# Patient Record
Sex: Female | Born: 1945 | Race: White | Hispanic: No | Smoking: Never smoker
Health system: Southern US, Community
[De-identification: ages and names within clinical notes are randomized; demographics above are authoritative.]

## PROBLEM LIST (undated history)

## (undated) DIAGNOSIS — C73 Malignant neoplasm of thyroid gland: Secondary | ICD-10-CM

## (undated) DIAGNOSIS — I5030 Unspecified diastolic (congestive) heart failure: Secondary | ICD-10-CM

## (undated) DIAGNOSIS — L80 Vitiligo: Secondary | ICD-10-CM

## (undated) DIAGNOSIS — M1990 Unspecified osteoarthritis, unspecified site: Secondary | ICD-10-CM

## (undated) DIAGNOSIS — E669 Obesity, unspecified: Secondary | ICD-10-CM

## (undated) DIAGNOSIS — I10 Essential (primary) hypertension: Secondary | ICD-10-CM

## (undated) DIAGNOSIS — D869 Sarcoidosis, unspecified: Secondary | ICD-10-CM

## (undated) DIAGNOSIS — N1832 Chronic kidney disease, stage 3b: Secondary | ICD-10-CM

## (undated) DIAGNOSIS — I350 Nonrheumatic aortic (valve) stenosis: Secondary | ICD-10-CM

## (undated) DIAGNOSIS — Z8619 Personal history of other infectious and parasitic diseases: Secondary | ICD-10-CM

## (undated) HISTORY — DX: Malignant neoplasm of thyroid gland: C73

## (undated) HISTORY — DX: Nonrheumatic aortic (valve) stenosis: I35.0

## (undated) HISTORY — DX: Personal history of other infectious and parasitic diseases: Z86.19

## (undated) HISTORY — DX: Vitiligo: L80

## (undated) HISTORY — DX: Hypocalcemia: E83.51

## (undated) HISTORY — DX: Essential (primary) hypertension: I10

## (undated) HISTORY — DX: Unspecified osteoarthritis, unspecified site: M19.90

## (undated) HISTORY — DX: Sarcoidosis, unspecified: D86.9

---

## 1972-05-21 HISTORY — PX: TUMOR EXCISION: SHX421

## 1980-10-19 HISTORY — PX: TUBAL LIGATION: SHX77

## 1984-10-19 HISTORY — PX: ABDOMINAL HYSTERECTOMY: SHX81

## 1992-07-19 HISTORY — PX: CHOLECYSTECTOMY: SHX55

## 1992-07-19 HISTORY — PX: OTHER SURGICAL HISTORY: SHX169

## 2001-09-16 ENCOUNTER — Ambulatory Visit (HOSPITAL_COMMUNITY): Admission: RE | Admit: 2001-09-16 | Discharge: 2001-09-16 | Payer: Self-pay | Admitting: Family Medicine

## 2001-09-16 ENCOUNTER — Encounter: Payer: Self-pay | Admitting: Family Medicine

## 2002-02-13 ENCOUNTER — Encounter: Payer: Self-pay | Admitting: Family Medicine

## 2002-02-13 ENCOUNTER — Ambulatory Visit (HOSPITAL_COMMUNITY): Admission: RE | Admit: 2002-02-13 | Discharge: 2002-02-13 | Payer: Self-pay | Admitting: Family Medicine

## 2002-11-17 ENCOUNTER — Encounter: Payer: Self-pay | Admitting: Family Medicine

## 2002-11-17 ENCOUNTER — Ambulatory Visit (HOSPITAL_COMMUNITY): Admission: RE | Admit: 2002-11-17 | Discharge: 2002-11-17 | Payer: Self-pay | Admitting: Family Medicine

## 2003-11-24 ENCOUNTER — Ambulatory Visit (HOSPITAL_COMMUNITY): Admission: RE | Admit: 2003-11-24 | Discharge: 2003-11-24 | Payer: Self-pay | Admitting: Family Medicine

## 2004-07-19 ENCOUNTER — Inpatient Hospital Stay (HOSPITAL_COMMUNITY): Admission: RE | Admit: 2004-07-19 | Discharge: 2004-07-22 | Payer: Self-pay | Admitting: Orthopedic Surgery

## 2004-08-15 ENCOUNTER — Ambulatory Visit (HOSPITAL_COMMUNITY): Admission: RE | Admit: 2004-08-15 | Discharge: 2004-08-15 | Payer: Self-pay | Admitting: Orthopedic Surgery

## 2005-01-18 ENCOUNTER — Ambulatory Visit (HOSPITAL_COMMUNITY): Admission: RE | Admit: 2005-01-18 | Discharge: 2005-01-18 | Payer: Self-pay | Admitting: *Deleted

## 2007-03-20 ENCOUNTER — Ambulatory Visit (HOSPITAL_COMMUNITY): Admission: RE | Admit: 2007-03-20 | Discharge: 2007-03-20 | Payer: Self-pay | Admitting: *Deleted

## 2010-10-06 NOTE — Op Note (Signed)
Samantha Wood                 ACCOUNT NO.:  [id number]   MEDICAL RECORD NO.:  [id number]          PATIENT TYPE:  INP   LOCATION:  X007                         FACILITY:  [facility]   PHYSICIAN:  Ollen Gross, M.D.    DATE OF BIRTH:  01/12/1946   DATE OF PROCEDURE:  07/19/2004  DATE OF DISCHARGE:                                 OPERATIVE REPORT   PREOPERATIVE DIAGNOSIS:  Osteoarthritis, left knee.   POSTOPERATIVE DIAGNOSIS:  Osteoarthritis, left knee.   OPERATION PERFORMED:  Left total knee arthroplasty, computer navigated.   SURGEON:  Ollen Gross, M.D.   ASSISTANT:  French Ana A. Shuford, P.A.-C.   ANESTHESIA:  Spinal.   ESTIMATED BLOOD LOSS:  Minimal.   DRAINS:  Hemovac times one.   TOURNIQUET TIME:  65 minutes at 300 mmHg.   COMPLICATIONS:  None.  Condition stable to recovery.   INDICATIONS FOR PROCEDURE:  [name] is a 65 year old female with end stage  medial compartment arthritis of the left knee with intractable pain.  She  has failed nonoperative management and presents now for left total knee  arthroplasty.   DESCRIPTION OF PROCEDURE:  After successful administration of spinal  anesthetic, a tourniquet was placed high on the left thigh and left lower  extremity prepped and draped in the usual sterile fashion.  Extremity was  wrapped with an Esmarch, knee flexed and tourniquet inflated to 300 mmHg.  Standard midline incision was made with a 10 blade through subcutaneous  tissue to the level of the extensor mechanism. A fresh blade was used to  make a medial parapatellar arthrotomy.  Then the soft tissue over the  proximal medial tibia subperiosteally elevated to the joint line with a  knife entered into the semimembranosus first with the Cobb elevator.  Soft  tissue over the proximal lateral tibia was also elevated with attention  being paid to avoid the patellar tendon and tibial tubercle.  The patella  was everted and the knee flexed 90 degrees and ACL and PCL  removed.  The  anterior aspect of both menisci were also removed.  Two 4 mm Shantz screws  were then placed into the proximal tibia and two placed into the femur for  placement of the computerized arrays.  Once intact and once the computer  confirmed the placement, we entered all the anatomic data for generation of  the computer driven models of the femur and tibia.  Once completed, our  alignment was found to be approximately 3 degrees of varus and she had about  one degree of hyperextension.  The distal femoral bony cut was then planned.  I removed the anterior aspect of the trochlear flange so that the cutting  block would sit flat on the femur.  We made our cut to take 10 mm off the  distal femur with a neutral varus valgus and neutral flexion extension  alignment.  The block was pinned and the cut made with an oscillating saw.  The computer guide was then placed on the bone cut and confirms that the cut  was made as planned.  The  alignment guide was then placed and we used an  epicondylar axis as our reference.  A size 4 was the most appropriate  femoral component and we subsequently placed the size 4 cutting block and  made the anterior, posterior and chamfer cut.  The verifier was then placed  and confirmed that the anterior cut was made as planned.   The tibia was then subluxed forward and the menisci were removed.  Tibial  cutting block was placed under computer guidance and we then removed  approximately 10 mm off the nondeficient lateral side and made the cut in  neutral varus valgus and about 2 degrees of posterior slope.  The cut was  then made with an oscillating saw and confirmed to be made as planned.  The  size 3 was the most appropriate tibial size and the proximal tibia was  prepared with the modular drill and keel punch for a size 3.  Femoral  preparation was completed with intercondylar cut for a size 4.  Size 3  mobile bearing tibial trial with a size 4 posterior  stabilized femoral trial  and a 10 mm posterior stabilized rotating platform inserter trial were  placed.  With a 10 we achieved full extension with excellent varus and  valgus balance throughout full range of motion.  The computer read that we  had approximately 1 degree flexion contracture when we were neutral varus  valgus.  The patella was then everted again and thickness measured to be 23  mm.  Free hand resection taken to 14 mm, a 35 template is placed, lug holes  are drilled, trial patella was placed and it tracks normally.  Osteophytes  were then removed off the posterior femur with the trials in place.  Full  extension was then confirmed on the computer when we had removed the  osteophytes.  All of the trials were then removed and the cut bone surfaces  were prepared with pulsatile lavage.  Cement was mixed and once ready for  implantation, the size 3 mobile bearing tibial tray, size 4 posterior  stabilized femur and 35 patella were cemented into place and the patella was  held with the clamp.  Trial 10 mm inserts were placed and knee held in full  extension, all extruded cement removed.  Once the cement was fully hardened,  then the permanent 10 mm posterior stabilized rotating platform insert was  placed into the tibial tray.  The knee was then copiously irrigated with  saline solution and the extensor mechanism closed over a Hemovac drain with  interrupted #1 PDS.  Flexion against gravity was about 130 degrees.  Tourniquet was released with a total time of 65 minutes.  Subcutaneous  tissue was closed with interrupted 2-0 Vicryl and subcuticular running 4-0  Monocryl.  Incision was cleaned and dried and Steri-Strips and a bulky  sterile dressing applied.  She was then awakened and transported to recovery  in stable condition.      FA/MEDQ  D:  07/19/2004  T:  07/19/2004  Job:  [id number]

## 2010-10-06 NOTE — Discharge Summary (Signed)
Samantha Wood, Samantha Wood                 ACCOUNT NO.:  [id number]   MEDICAL RECORD NO.:  [id number]          PATIENT TYPE:  INP   LOCATION:  0465                         FACILITY:  [facility]   PHYSICIAN:  Ollen Gross, M.D.    DATE OF BIRTH:  November 19, 1945   DATE OF ADMISSION:  07/19/2004  DATE OF DISCHARGE:  07/22/2004                                 DISCHARGE SUMMARY   ADMISSION DIAGNOSES:  1.  Osteoarthritis left knee.  2.  Hypertension.  3.  Hemorrhoids.  4.  Postmenopausal.   DISCHARGE DIAGNOSES:  1.  Osteoarthritis left knee status post left total knee arthroplasty,      computer navigation assisted.  2.  Mild postoperative blood loss anemia, did not require transfusion.  3.  Hypertension.  4.  Hemorrhoids.  5.  Postmenopausal.   PROCEDURE:  July 19, 2004 - left total knee arthroplasty, computer  navigation assisted. Surgeon:  Dr. Lequita Halt. Assistant:  Ralene Bathe, P.A.-  C. Spinal anesthesia. Minimal blood loss. Hemovac drain x1. Tourniquet time  65 minutes at 300 mmHg.   BRIEF HISTORY:  Ms. Brindisi is a 65 year old female with end-stage medial  compartment arthritis of the left knee who failed nonoperative management  and now presents for a total knee arthroplasty.   CONSULTS:  None.   LABORATORY DATA:  Preoperative CBC:  Hemoglobin 13.8, hematocrit of 40.0,  white cell count 4.9, differential within normal limits. Postoperative  hemoglobin 11.6. Last noted H&H 10.1 and 28.8. PT/PTT preoperatively 13.4  and 31 respectively with INR 1.0. Serial protimes followed. Last noted  PT/INR 18.2 and 1.8. Chem panel on admission:  Glucose elevated at 120,  albumin of 3.4; remaining Chem panel all within normal limits. Serial BMETs  were followed. Glucose went up to 128, back down to 115; calcium dropped  from 9.0 down to 8.1. Electrolytes remained within normal limits. Urinalysis  preoperatively negative. Blood group/type O positive. EKG dated July 14, 2004:  Normal sinus rhythm,  left atrial enlargement, no old tracings to  compare; confirmed by Dr. Daphene Jaeger. Two-view chest on July 14, 2004:  No evidence of active disease.   HOSPITAL COURSE:  Admitted to [facility], underwent above  procedure without complication. Tolerated the procedure well. Started on PCA  and p.o. analgesics for pain control. Hemovac drain placed at the time of  surgery pulled on day #1. She was started on Coumadin per protocol. Started  getting up with therapy out of bed. Day #2 weaned over to p.o. medications  and IV, PCA, and Foley were discontinued. She did well with her therapy and  was ambulating approximately 60 feet and then 80 feet on day #2. She  progressed very well and by day #3 she had already started ambulating 200  feet. She had been weaned over to p.o. medications. Incision was healing  well after the dressing change on day #2, no signs of infection, improved,  and wanted to go home.   DISCHARGE MEDICATIONS/PLAN FOR July 22, 2004:  1.  Discharge home.  2.  Discharge diagnoses:  Please see above.  3.  Discharge medications:  Percocet, Robaxin, Coumadin.  4.  Diet as tolerated.  5.  Activity:  She is weightbearing as tolerated, total knee protocol. 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 May start showering.  6.  Follow up 2 weeks from surgery.   DISPOSITION:  Home.   CONDITION UPON DISCHARGE:  Improved.      ALP/MEDQ  D:  08/21/2004  T:  08/21/2004  Job:  [id number]   cc:   Donzetta Sprung  [address], [address]  [place]  [place] [address]  Fax: [phone]

## 2010-10-06 NOTE — H&P (Signed)
NAME:  Samantha Wood, Samantha Wood                 ACCOUNT NO.:  [id number]   MEDICAL RECORD NO.:  [id number]          PATIENT TYPE:  INP   LOCATION:  NA                           [facility]ACILITY[facility]   PHYSICIAN:  Ollen Gross, M.D.    DATE OF BIRTH:  1945-11-26   DATE OF ADMISSION:  07/19/2004  DATE OF DISCHARGE:                                HISTORY & PHYSICAL   CHIEF COMPLAINT:  Left knee pain.   HISTORY OF PRESENT ILLNESS:  The patient is a 65 year old female with a  longstanding history of problems related to her left knee. She does have  bilateral knee pain but left knee is more symptomatic than the right. Over  the past three years, it has gotten progressively worse to the point now  where it is waking her up at night and causing difficulty in doing certain  activities. She is seen in the office. Her x-rays show that she has bone-on-  bone in the medial compartment with about a 5 degree varus deformity, also  significant patella femoral narrowing. It is felt that due to this symptoms  she is having and the significant findings on x-ray that she would benefit  from undergoing knee replacement. Risks and benefits discussed. The patient  was subsequently admitted to the hospital.   ALLERGIES:  CODEINE causes nausea and vomiting.   MEDICATIONS:  1.  Diltiazem CD 300 mg q.d.  2.  Lisinopril 20/12.5 mg q.d.  3.  Premarin 1.25 mg q.d.  4.  Lorazepam 0.5 mg q.8h. p.r.n.  5.  Ultram 50 mg 1-2 tablets q.4-6h. p.r.n.   PAST MEDICAL HISTORY:  1.  Hypertension.  2.  Hemorrhoids.  3.  Postmenopausal.   PAST SURGICAL HISTORY:  1.  Benign tumor excised from thyroid.  2.  Complete hysterectomy.  3.  Gallbladder surgery.   SOCIAL HISTORY:  Married. She works in the [employer], accounts  payable. Nonsmoker and no alcohol. One child.   FAMILY HISTORY:  Mother with history of heart disease, diabetes,  hypertension, stroke, and arthritis. Father with history of heart disease.   REVIEW OF  SYMPTOMS:  GENERAL:  No fever, chills, or night sweats.  NEUROLOGICAL:  No seizure, syncope, or paralysis. RESPIRATORY:  No shortness  of breath, productive cough, or hemoptysis. CARDIOVASCULAR:  No chest pain,  angina, orthopnea. GI:  No nausea, vomiting, diarrhea, or constipation. GU:  No dysuria, hematuria, or discharge. MUSCULOSKELETAL:  Left knee as found in  the history of present illness.   PHYSICAL EXAMINATION:  VITAL SIGNS:  Pulse 74, respirations 14, blood  pressure 142/78.  GENERAL:  A 65 year old female, well-developed, well-nourished in no acute  distress. She is alert, oriented, and cooperative.  HEENT:  Normocephalic and atraumatic. Pupils are equal, round, and reactive.  The patient is known to wear glasses. EOMs are intact.  NECK:  Supple. No bruits are appreciated.  CHEST:  Clear anterior and posterior chest walls. No rhonchi, rubs, or  wheezing.  HEART:  Regular rate and rhythm.  ABDOMEN:  Soft and nontender. Bowel sounds are present.  RECTAL:  Not done,  not pertinent to present illness.  BREASTS:  Not done, not pertinent to present illness.  GENITALIA:  Not done, not pertinent to present illness.  EXTREMITIES:  Left knee shows she does have a slight varus malalignment of  about 5 degrees. Range of motion of 5 to 115 degrees. Crepitus noted on  passive range of motion.   IMPRESSION:  1.  Osteoarthritis left knee.  2.  Hypertension.  3.  Hemorrhoids.  4.  Postmenopausal.   PLAN:  The patient is admitted to [facility] to undergo a left  total knee arthroplasty. Surgery will be performed by Dr. Ollen Gross.      ALP/MEDQ  D:  07/18/2004  T:  07/18/2004  Job:  [id number]   cc:   Ollen Gross, M.D.  [facility]  [address]  [place]  [place] [address]  Fax: [phone]   Donzetta Sprung  [address], [address]  [place]  [place] [address]  Fax: [phone]

## 2011-02-19 LAB — HM MAMMOGRAPHY

## 2011-12-20 HISTORY — PX: TOTAL THYROIDECTOMY: SHX2547

## 2012-02-04 ENCOUNTER — Encounter: Payer: Self-pay | Admitting: Endocrinology

## 2012-02-04 ENCOUNTER — Ambulatory Visit (INDEPENDENT_AMBULATORY_CARE_PROVIDER_SITE_OTHER): Payer: Medicare Other | Admitting: Endocrinology

## 2012-02-04 ENCOUNTER — Other Ambulatory Visit (INDEPENDENT_AMBULATORY_CARE_PROVIDER_SITE_OTHER): Payer: Medicare Other

## 2012-02-04 VITALS — BP 112/78 | HR 78 | Temp 98.4°F | Ht 64.0 in | Wt 223.0 lb

## 2012-02-04 DIAGNOSIS — E890 Postprocedural hypothyroidism: Secondary | ICD-10-CM

## 2012-02-04 DIAGNOSIS — D869 Sarcoidosis, unspecified: Secondary | ICD-10-CM | POA: Insufficient documentation

## 2012-02-04 NOTE — Patient Instructions (Addendum)
The low calcium almost always goes back to normal by itself.  You can have it rechecked at dr wilson's office in a few days.   blood tests are being requested for you today.  You will receive a letter with results. Stop the levothyroxine pill.   i will probably advise you to recheck the blood test next week.   When your thyroid is low enough, i'll order for you the radioactive iodine pill, to destroy any remaining thyroid cells.     you will notice no symptoms from this.  The pill is gone from your body in a few days (during which you should stay away from other people), but takes several months to work.  X-ray will advise you to go back for a thyroid x-ray approximately 5 days later.  This treatment has been available for many years, and is very safe at this dosage.   Please come back for a follow-up appointment in 6 months.  Come back sooner if you feel you need to.

## 2012-02-04 NOTE — Progress Notes (Signed)
Subjective:    Patient ID: Samantha Wood, female    DOB: 1946/04/24, 66 y.o.   MRN: [id number]  HPI Pt underwent a partial thyroidectomy in the 1970's, for a goiter.  No cancer was found.  She noted a slight recurrent nodule 4 mos ago, but no assoc pain.  After a suspicious bx, she underwent completion thyroidectomy approx 4 weeks ago.  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   Past Medical History  Diagnosis Date  . Hypertension   . Follicular thyroid carcinoma   . Hypocalcemia   . Vitiligo   . Sarcoidosis   . History of chicken pox   . Arthritis     Past Surgical History  Procedure Date  . Tumor excision 05/1972    left side of thyroid  . Tubal ligation 10/1980  . Abdominal hysterectomy 10/1984  . Total thyroidectomy 12/2011  . Gallstone removal from bile duct 07/1992  . Cholecystectomy 07/1992    three weeks after gallstone removal    History   Social History  . Marital Status: Married    Spouse Name: N/A    Number of Children: 1  . Years of Education: 14   Occupational History  . Retired    Social History Main Topics  . Smoking status: Never Smoker   . Smokeless tobacco: Never Used  . Alcohol Use: No  . Drug Use: Not on file  . Sexually Active: Not on file   Other Topics Concern  . Not on file   Social History Narrative   Regular exercise-yesCaffeine Use-no    Current Outpatient Prescriptions on File Prior to Visit  Medication Sig Dispense Refill  . calcium-vitamin D (OSCAL WITH D) 500-200 MG-UNIT per tablet Take 1 tablet by mouth 4 (four) times daily.      . diphenhydrAMINE (BENADRYL) 25 MG tablet Take 25 mg by mouth at bedtime.      Marland Kitchen estradiol (ESTRACE) 1 MG tablet Take 1 mg by mouth daily.       Marland Kitchen levothyroxine (SYNTHROID, LEVOTHROID) 100 MCG tablet Take 100 mcg by mouth daily.       Marland Kitchen lisinopril-hydrochlorothiazide (PRINZIDE,ZESTORETIC) 20-12.5 MG per tablet Take 1 tablet by mouth daily.         Allergies  Allergen  Reactions  . Amoxicillin Nausea And Vomiting  . Codeine     Family History  Problem Relation Age of Onset  . Heart disease Mother   . Stroke Mother   . Hypertension Mother   . Diabetes Mother   . Heart disease Father   . Diabetes Maternal Grandmother   mother had a goiter  BP 112/78  Pulse 78  Temp 98.4 F (36.9 C) (Oral)  Ht 5\' 4"  (1.626 m)  Wt 223 lb (101.152 kg)  BMI 38.28 kg/m2  SpO2 97%  Review of Systems denies depression, hair loss, sob, weight gain, memory loss, constipation, blurry vision, myalgias, dry skin, rhinorrhea, easy bruising, and syncope.  Muscle cramps are much better.  Numbness is getting better.  She has easy bruising.    Objective:   Physical Exam VS: see vs page GEN: no distress HEAD: head: no deformity eyes: no periorbital swelling, no proptosis external nose and ears are normal mouth: no lesion seen NECK: a healed scar is present.  i do not appreciate a nodule in the thyroid or elsewhere in the neck CHEST [place]: no deformity LUNGS:  Clear to auscultation CV: reg rate and rhythm, no murmur ABD: abdomen is  soft, nontender.  no hepatosplenomegaly.  not distended.  no hernia MUSCULOSKELETAL: muscle bulk and strength are grossly normal.  no obvious joint swelling.  gait is normal and steady EXTEMITIES: no deformity.  no ulcer on the feet.  feet are of normal color and temp.  no edema PULSES: dorsalis pedis intact bilat.   NEURO:  cn 2-12 grossly intact.   readily moves all 4's.  sensation is intact to touch on the feet SKIN:  Normal texture and temperature.  Vitiligo is noted NODES:  None palpable at the neck PSYCH: alert, oriented x3.  Does not appear anxious nor depressed. 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  She needs adjuvant I-131 rx Postsurgical hypothyroidism.  She'll need to be off synthroid for I-131 Postsurgical hypocalcemia,  which is almost always transient.

## 2012-02-06 ENCOUNTER — Encounter: Payer: Self-pay | Admitting: Endocrinology

## 2012-02-06 ENCOUNTER — Telehealth: Payer: Self-pay | Admitting: Endocrinology

## 2012-02-06 DIAGNOSIS — C73 Malignant neoplasm of thyroid gland: Secondary | ICD-10-CM | POA: Insufficient documentation

## 2012-02-06 DIAGNOSIS — M1990 Unspecified osteoarthritis, unspecified site: Secondary | ICD-10-CM | POA: Insufficient documentation

## 2012-02-06 DIAGNOSIS — I10 Essential (primary) hypertension: Secondary | ICD-10-CM | POA: Insufficient documentation

## 2012-02-06 NOTE — Telephone Encounter (Signed)
please call patient: To clarify, pt should not take the thyroid pill for now Please redo the blood test next week

## 2012-02-07 NOTE — Telephone Encounter (Signed)
Called pt LMOVM to call back 

## 2012-02-11 NOTE — Telephone Encounter (Signed)
You may be right.  Please do the blood test today, to see.

## 2012-02-11 NOTE — Telephone Encounter (Signed)
Left message on machine for pt to return my call  

## 2012-02-11 NOTE — Telephone Encounter (Signed)
Pt called c/o swelling in the face and ankles that's he believes is a side effect of thyroid treatment. Pt is requesting advisement from MD

## 2012-02-11 NOTE — Telephone Encounter (Signed)
Pt advised.

## 2012-02-12 ENCOUNTER — Ambulatory Visit (INDEPENDENT_AMBULATORY_CARE_PROVIDER_SITE_OTHER): Payer: Medicare Other | Admitting: Endocrinology

## 2012-02-12 DIAGNOSIS — E890 Postprocedural hypothyroidism: Secondary | ICD-10-CM

## 2012-02-18 ENCOUNTER — Other Ambulatory Visit (INDEPENDENT_AMBULATORY_CARE_PROVIDER_SITE_OTHER): Payer: Medicare Other

## 2012-02-18 ENCOUNTER — Other Ambulatory Visit: Payer: Self-pay | Admitting: Endocrinology

## 2012-02-18 DIAGNOSIS — E890 Postprocedural hypothyroidism: Secondary | ICD-10-CM

## 2012-02-18 DIAGNOSIS — C73 Malignant neoplasm of thyroid gland: Secondary | ICD-10-CM

## 2012-02-22 ENCOUNTER — Other Ambulatory Visit: Payer: Self-pay | Admitting: Endocrinology

## 2012-02-22 DIAGNOSIS — C73 Malignant neoplasm of thyroid gland: Secondary | ICD-10-CM

## 2012-02-25 ENCOUNTER — Encounter (HOSPITAL_COMMUNITY)
Admission: RE | Admit: 2012-02-25 | Discharge: 2012-02-25 | Disposition: A | Discharge status: Home / Self Care | Payer: Medicare Other | Source: Ambulatory Visit | Attending: Endocrinology | Admitting: Endocrinology

## 2012-02-25 ENCOUNTER — Encounter (HOSPITAL_COMMUNITY): Payer: Self-pay

## 2012-02-25 DIAGNOSIS — C73 Malignant neoplasm of thyroid gland: Secondary | ICD-10-CM

## 2012-02-25 MED ORDER — SODIUM IODIDE I 131 CAPSULE
53.3000 | Freq: Once | INTRAVENOUS | Status: AC | PRN
  Administered 2012-02-25: 53.3 via ORAL

## 2012-03-06 ENCOUNTER — Encounter (HOSPITAL_COMMUNITY)
Admission: RE | Admit: 2012-03-06 | Discharge: 2012-03-06 | Disposition: A | Discharge status: Home / Self Care | Payer: Medicare Other | Source: Ambulatory Visit | Attending: Endocrinology | Admitting: Endocrinology

## 2012-03-06 DIAGNOSIS — C73 Malignant neoplasm of thyroid gland: Secondary | ICD-10-CM | POA: Insufficient documentation

## 2012-03-06 MED ORDER — SODIUM IODIDE I 131 CAPSULE: 53.3000 | Freq: Once | INTRAVENOUS | Status: DC | PRN

## 2012-06-23 ENCOUNTER — Ambulatory Visit: Payer: Medicare Other | Admitting: Endocrinology

## 2012-07-05 ENCOUNTER — Other Ambulatory Visit: Payer: Self-pay

## 2012-12-24 ENCOUNTER — Other Ambulatory Visit: Payer: Self-pay

## 2013-03-26 ENCOUNTER — Other Ambulatory Visit: Payer: Self-pay

## 2014-07-01 DIAGNOSIS — E039 Hypothyroidism, unspecified: Secondary | ICD-10-CM | POA: Diagnosis not present

## 2014-07-01 DIAGNOSIS — N183 Chronic kidney disease, stage 3 (moderate): Secondary | ICD-10-CM | POA: Diagnosis not present

## 2014-07-01 DIAGNOSIS — I10 Essential (primary) hypertension: Secondary | ICD-10-CM | POA: Diagnosis not present

## 2014-07-01 DIAGNOSIS — E782 Mixed hyperlipidemia: Secondary | ICD-10-CM | POA: Diagnosis not present

## 2014-07-08 DIAGNOSIS — E039 Hypothyroidism, unspecified: Secondary | ICD-10-CM | POA: Diagnosis not present

## 2014-07-08 DIAGNOSIS — E782 Mixed hyperlipidemia: Secondary | ICD-10-CM | POA: Diagnosis not present

## 2014-07-08 DIAGNOSIS — Z1389 Encounter for screening for other disorder: Secondary | ICD-10-CM | POA: Diagnosis not present

## 2014-07-08 DIAGNOSIS — E200 Idiopathic hypoparathyroidism: Secondary | ICD-10-CM | POA: Diagnosis not present

## 2014-07-08 DIAGNOSIS — E6609 Other obesity due to excess calories: Secondary | ICD-10-CM | POA: Diagnosis not present

## 2014-07-13 DIAGNOSIS — Z1231 Encounter for screening mammogram for malignant neoplasm of breast: Secondary | ICD-10-CM | POA: Diagnosis not present

## 2014-11-15 ENCOUNTER — Other Ambulatory Visit: Payer: Self-pay

## 2014-11-24 DIAGNOSIS — I10 Essential (primary) hypertension: Secondary | ICD-10-CM | POA: Diagnosis not present

## 2014-11-24 DIAGNOSIS — K219 Gastro-esophageal reflux disease without esophagitis: Secondary | ICD-10-CM | POA: Diagnosis not present

## 2014-11-24 DIAGNOSIS — E039 Hypothyroidism, unspecified: Secondary | ICD-10-CM | POA: Diagnosis not present

## 2014-11-24 DIAGNOSIS — E782 Mixed hyperlipidemia: Secondary | ICD-10-CM | POA: Diagnosis not present

## 2014-12-01 DIAGNOSIS — E039 Hypothyroidism, unspecified: Secondary | ICD-10-CM | POA: Diagnosis not present

## 2014-12-01 DIAGNOSIS — E6609 Other obesity due to excess calories: Secondary | ICD-10-CM | POA: Diagnosis not present

## 2014-12-01 DIAGNOSIS — E782 Mixed hyperlipidemia: Secondary | ICD-10-CM | POA: Diagnosis not present

## 2014-12-01 DIAGNOSIS — E200 Idiopathic hypoparathyroidism: Secondary | ICD-10-CM | POA: Diagnosis not present

## 2015-08-05 DIAGNOSIS — E782 Mixed hyperlipidemia: Secondary | ICD-10-CM | POA: Diagnosis not present

## 2015-08-05 DIAGNOSIS — I10 Essential (primary) hypertension: Secondary | ICD-10-CM | POA: Diagnosis not present

## 2015-08-05 DIAGNOSIS — N183 Chronic kidney disease, stage 3 (moderate): Secondary | ICD-10-CM | POA: Diagnosis not present

## 2015-08-05 DIAGNOSIS — E039 Hypothyroidism, unspecified: Secondary | ICD-10-CM | POA: Diagnosis not present

## 2015-08-09 DIAGNOSIS — E782 Mixed hyperlipidemia: Secondary | ICD-10-CM | POA: Diagnosis not present

## 2015-08-16 DIAGNOSIS — Z1212 Encounter for screening for malignant neoplasm of rectum: Secondary | ICD-10-CM | POA: Diagnosis not present

## 2015-08-16 DIAGNOSIS — Z0001 Encounter for general adult medical examination with abnormal findings: Secondary | ICD-10-CM | POA: Diagnosis not present

## 2015-08-16 DIAGNOSIS — E782 Mixed hyperlipidemia: Secondary | ICD-10-CM | POA: Diagnosis not present

## 2015-08-16 DIAGNOSIS — E039 Hypothyroidism, unspecified: Secondary | ICD-10-CM | POA: Diagnosis not present

## 2015-08-16 DIAGNOSIS — E200 Idiopathic hypoparathyroidism: Secondary | ICD-10-CM | POA: Diagnosis not present

## 2015-08-16 DIAGNOSIS — Z1389 Encounter for screening for other disorder: Secondary | ICD-10-CM | POA: Diagnosis not present

## 2015-08-16 DIAGNOSIS — E6609 Other obesity due to excess calories: Secondary | ICD-10-CM | POA: Diagnosis not present

## 2015-08-19 DIAGNOSIS — R011 Cardiac murmur, unspecified: Secondary | ICD-10-CM | POA: Diagnosis not present

## 2015-08-19 DIAGNOSIS — R931 Abnormal findings on diagnostic imaging of heart and coronary circulation: Secondary | ICD-10-CM | POA: Diagnosis not present

## 2015-08-19 DIAGNOSIS — I083 Combined rheumatic disorders of mitral, aortic and tricuspid valves: Secondary | ICD-10-CM | POA: Diagnosis not present

## 2015-08-19 DIAGNOSIS — I517 Cardiomegaly: Secondary | ICD-10-CM | POA: Diagnosis not present

## 2016-01-25 DIAGNOSIS — M545 Low back pain: Secondary | ICD-10-CM | POA: Diagnosis not present

## 2016-01-25 DIAGNOSIS — M7062 Trochanteric bursitis, left hip: Secondary | ICD-10-CM | POA: Diagnosis not present

## 2016-02-14 DIAGNOSIS — I10 Essential (primary) hypertension: Secondary | ICD-10-CM | POA: Diagnosis not present

## 2016-02-14 DIAGNOSIS — K219 Gastro-esophageal reflux disease without esophagitis: Secondary | ICD-10-CM | POA: Diagnosis not present

## 2016-02-14 DIAGNOSIS — N183 Chronic kidney disease, stage 3 (moderate): Secondary | ICD-10-CM | POA: Diagnosis not present

## 2016-02-14 DIAGNOSIS — E782 Mixed hyperlipidemia: Secondary | ICD-10-CM | POA: Diagnosis not present

## 2016-02-14 DIAGNOSIS — E039 Hypothyroidism, unspecified: Secondary | ICD-10-CM | POA: Diagnosis not present

## 2016-02-17 DIAGNOSIS — E039 Hypothyroidism, unspecified: Secondary | ICD-10-CM | POA: Diagnosis not present

## 2016-02-17 DIAGNOSIS — E200 Idiopathic hypoparathyroidism: Secondary | ICD-10-CM | POA: Diagnosis not present

## 2016-02-17 DIAGNOSIS — E782 Mixed hyperlipidemia: Secondary | ICD-10-CM | POA: Diagnosis not present

## 2016-02-17 DIAGNOSIS — I10 Essential (primary) hypertension: Secondary | ICD-10-CM | POA: Diagnosis not present

## 2016-09-03 DIAGNOSIS — K219 Gastro-esophageal reflux disease without esophagitis: Secondary | ICD-10-CM | POA: Diagnosis not present

## 2016-09-03 DIAGNOSIS — N183 Chronic kidney disease, stage 3 (moderate): Secondary | ICD-10-CM | POA: Diagnosis not present

## 2016-09-03 DIAGNOSIS — E039 Hypothyroidism, unspecified: Secondary | ICD-10-CM | POA: Diagnosis not present

## 2016-09-03 DIAGNOSIS — E782 Mixed hyperlipidemia: Secondary | ICD-10-CM | POA: Diagnosis not present

## 2016-09-03 DIAGNOSIS — I10 Essential (primary) hypertension: Secondary | ICD-10-CM | POA: Diagnosis not present

## 2016-09-05 DIAGNOSIS — E200 Idiopathic hypoparathyroidism: Secondary | ICD-10-CM | POA: Diagnosis not present

## 2016-09-05 DIAGNOSIS — E039 Hypothyroidism, unspecified: Secondary | ICD-10-CM | POA: Diagnosis not present

## 2016-09-05 DIAGNOSIS — I10 Essential (primary) hypertension: Secondary | ICD-10-CM | POA: Diagnosis not present

## 2016-09-05 DIAGNOSIS — E782 Mixed hyperlipidemia: Secondary | ICD-10-CM | POA: Diagnosis not present

## 2017-01-03 DIAGNOSIS — N183 Chronic kidney disease, stage 3 (moderate): Secondary | ICD-10-CM | POA: Diagnosis not present

## 2017-01-03 DIAGNOSIS — I10 Essential (primary) hypertension: Secondary | ICD-10-CM | POA: Diagnosis not present

## 2017-01-03 DIAGNOSIS — E782 Mixed hyperlipidemia: Secondary | ICD-10-CM | POA: Diagnosis not present

## 2017-01-03 DIAGNOSIS — E039 Hypothyroidism, unspecified: Secondary | ICD-10-CM | POA: Diagnosis not present

## 2017-01-03 DIAGNOSIS — K219 Gastro-esophageal reflux disease without esophagitis: Secondary | ICD-10-CM | POA: Diagnosis not present

## 2017-01-03 DIAGNOSIS — Z9189 Other specified personal risk factors, not elsewhere classified: Secondary | ICD-10-CM | POA: Diagnosis not present

## 2017-01-08 DIAGNOSIS — N183 Chronic kidney disease, stage 3 (moderate): Secondary | ICD-10-CM | POA: Diagnosis not present

## 2017-01-08 DIAGNOSIS — I10 Essential (primary) hypertension: Secondary | ICD-10-CM | POA: Diagnosis not present

## 2017-01-08 DIAGNOSIS — Z0001 Encounter for general adult medical examination with abnormal findings: Secondary | ICD-10-CM | POA: Diagnosis not present

## 2017-01-08 DIAGNOSIS — Z1212 Encounter for screening for malignant neoplasm of rectum: Secondary | ICD-10-CM | POA: Diagnosis not present

## 2017-01-08 DIAGNOSIS — I2510 Atherosclerotic heart disease of native coronary artery without angina pectoris: Secondary | ICD-10-CM | POA: Diagnosis not present

## 2017-01-08 DIAGNOSIS — R011 Cardiac murmur, unspecified: Secondary | ICD-10-CM | POA: Diagnosis not present

## 2017-02-20 DIAGNOSIS — E039 Hypothyroidism, unspecified: Secondary | ICD-10-CM | POA: Diagnosis not present

## 2017-03-04 DIAGNOSIS — I10 Essential (primary) hypertension: Secondary | ICD-10-CM | POA: Diagnosis not present

## 2017-03-04 DIAGNOSIS — M7712 Lateral epicondylitis, left elbow: Secondary | ICD-10-CM | POA: Diagnosis not present

## 2017-03-04 DIAGNOSIS — L570 Actinic keratosis: Secondary | ICD-10-CM | POA: Diagnosis not present

## 2017-03-04 DIAGNOSIS — M7711 Lateral epicondylitis, right elbow: Secondary | ICD-10-CM | POA: Diagnosis not present

## 2017-06-17 DIAGNOSIS — R3915 Urgency of urination: Secondary | ICD-10-CM | POA: Diagnosis not present

## 2017-07-03 DIAGNOSIS — E782 Mixed hyperlipidemia: Secondary | ICD-10-CM | POA: Diagnosis not present

## 2017-07-03 DIAGNOSIS — I10 Essential (primary) hypertension: Secondary | ICD-10-CM | POA: Diagnosis not present

## 2017-07-03 DIAGNOSIS — Z9189 Other specified personal risk factors, not elsewhere classified: Secondary | ICD-10-CM | POA: Diagnosis not present

## 2017-07-03 DIAGNOSIS — K219 Gastro-esophageal reflux disease without esophagitis: Secondary | ICD-10-CM | POA: Diagnosis not present

## 2017-07-03 DIAGNOSIS — I350 Nonrheumatic aortic (valve) stenosis: Secondary | ICD-10-CM | POA: Diagnosis not present

## 2017-07-09 DIAGNOSIS — L570 Actinic keratosis: Secondary | ICD-10-CM | POA: Diagnosis not present

## 2017-07-09 DIAGNOSIS — E200 Idiopathic hypoparathyroidism: Secondary | ICD-10-CM | POA: Diagnosis not present

## 2017-07-09 DIAGNOSIS — I10 Essential (primary) hypertension: Secondary | ICD-10-CM | POA: Diagnosis not present

## 2017-07-09 DIAGNOSIS — E782 Mixed hyperlipidemia: Secondary | ICD-10-CM | POA: Diagnosis not present

## 2017-07-09 DIAGNOSIS — E039 Hypothyroidism, unspecified: Secondary | ICD-10-CM | POA: Diagnosis not present

## 2017-09-03 DIAGNOSIS — D044 Carcinoma in situ of skin of scalp and neck: Secondary | ICD-10-CM | POA: Diagnosis not present

## 2017-09-03 DIAGNOSIS — C4441 Basal cell carcinoma of skin of scalp and neck: Secondary | ICD-10-CM | POA: Diagnosis not present

## 2017-12-27 DIAGNOSIS — M545 Low back pain: Secondary | ICD-10-CM | POA: Diagnosis not present

## 2017-12-27 DIAGNOSIS — I10 Essential (primary) hypertension: Secondary | ICD-10-CM | POA: Diagnosis not present

## 2017-12-30 DIAGNOSIS — S233XXA Sprain of ligaments of thoracic spine, initial encounter: Secondary | ICD-10-CM | POA: Diagnosis not present

## 2017-12-30 DIAGNOSIS — S134XXA Sprain of ligaments of cervical spine, initial encounter: Secondary | ICD-10-CM | POA: Diagnosis not present

## 2017-12-30 DIAGNOSIS — M9901 Segmental and somatic dysfunction of cervical region: Secondary | ICD-10-CM | POA: Diagnosis not present

## 2017-12-30 DIAGNOSIS — M9902 Segmental and somatic dysfunction of thoracic region: Secondary | ICD-10-CM | POA: Diagnosis not present

## 2017-12-30 DIAGNOSIS — M9903 Segmental and somatic dysfunction of lumbar region: Secondary | ICD-10-CM | POA: Diagnosis not present

## 2017-12-31 DIAGNOSIS — M9902 Segmental and somatic dysfunction of thoracic region: Secondary | ICD-10-CM | POA: Diagnosis not present

## 2017-12-31 DIAGNOSIS — M9903 Segmental and somatic dysfunction of lumbar region: Secondary | ICD-10-CM | POA: Diagnosis not present

## 2017-12-31 DIAGNOSIS — S134XXA Sprain of ligaments of cervical spine, initial encounter: Secondary | ICD-10-CM | POA: Diagnosis not present

## 2017-12-31 DIAGNOSIS — M9901 Segmental and somatic dysfunction of cervical region: Secondary | ICD-10-CM | POA: Diagnosis not present

## 2017-12-31 DIAGNOSIS — S233XXA Sprain of ligaments of thoracic spine, initial encounter: Secondary | ICD-10-CM | POA: Diagnosis not present

## 2018-01-03 DIAGNOSIS — M9901 Segmental and somatic dysfunction of cervical region: Secondary | ICD-10-CM | POA: Diagnosis not present

## 2018-01-03 DIAGNOSIS — S134XXA Sprain of ligaments of cervical spine, initial encounter: Secondary | ICD-10-CM | POA: Diagnosis not present

## 2018-01-03 DIAGNOSIS — M9902 Segmental and somatic dysfunction of thoracic region: Secondary | ICD-10-CM | POA: Diagnosis not present

## 2018-01-03 DIAGNOSIS — S233XXA Sprain of ligaments of thoracic spine, initial encounter: Secondary | ICD-10-CM | POA: Diagnosis not present

## 2018-01-03 DIAGNOSIS — M9903 Segmental and somatic dysfunction of lumbar region: Secondary | ICD-10-CM | POA: Diagnosis not present

## 2018-01-07 DIAGNOSIS — M9903 Segmental and somatic dysfunction of lumbar region: Secondary | ICD-10-CM | POA: Diagnosis not present

## 2018-01-07 DIAGNOSIS — M9901 Segmental and somatic dysfunction of cervical region: Secondary | ICD-10-CM | POA: Diagnosis not present

## 2018-01-07 DIAGNOSIS — S134XXA Sprain of ligaments of cervical spine, initial encounter: Secondary | ICD-10-CM | POA: Diagnosis not present

## 2018-01-07 DIAGNOSIS — M9902 Segmental and somatic dysfunction of thoracic region: Secondary | ICD-10-CM | POA: Diagnosis not present

## 2018-01-07 DIAGNOSIS — S233XXA Sprain of ligaments of thoracic spine, initial encounter: Secondary | ICD-10-CM | POA: Diagnosis not present

## 2018-01-09 DIAGNOSIS — I350 Nonrheumatic aortic (valve) stenosis: Secondary | ICD-10-CM | POA: Diagnosis not present

## 2018-01-09 DIAGNOSIS — M9901 Segmental and somatic dysfunction of cervical region: Secondary | ICD-10-CM | POA: Diagnosis not present

## 2018-01-09 DIAGNOSIS — E782 Mixed hyperlipidemia: Secondary | ICD-10-CM | POA: Diagnosis not present

## 2018-01-09 DIAGNOSIS — K219 Gastro-esophageal reflux disease without esophagitis: Secondary | ICD-10-CM | POA: Diagnosis not present

## 2018-01-09 DIAGNOSIS — E200 Idiopathic hypoparathyroidism: Secondary | ICD-10-CM | POA: Diagnosis not present

## 2018-01-09 DIAGNOSIS — M9902 Segmental and somatic dysfunction of thoracic region: Secondary | ICD-10-CM | POA: Diagnosis not present

## 2018-01-09 DIAGNOSIS — M9903 Segmental and somatic dysfunction of lumbar region: Secondary | ICD-10-CM | POA: Diagnosis not present

## 2018-01-09 DIAGNOSIS — S233XXA Sprain of ligaments of thoracic spine, initial encounter: Secondary | ICD-10-CM | POA: Diagnosis not present

## 2018-01-09 DIAGNOSIS — S134XXA Sprain of ligaments of cervical spine, initial encounter: Secondary | ICD-10-CM | POA: Diagnosis not present

## 2018-01-09 DIAGNOSIS — Z9189 Other specified personal risk factors, not elsewhere classified: Secondary | ICD-10-CM | POA: Diagnosis not present

## 2018-01-14 DIAGNOSIS — M9901 Segmental and somatic dysfunction of cervical region: Secondary | ICD-10-CM | POA: Diagnosis not present

## 2018-01-14 DIAGNOSIS — M9903 Segmental and somatic dysfunction of lumbar region: Secondary | ICD-10-CM | POA: Diagnosis not present

## 2018-01-14 DIAGNOSIS — S233XXA Sprain of ligaments of thoracic spine, initial encounter: Secondary | ICD-10-CM | POA: Diagnosis not present

## 2018-01-14 DIAGNOSIS — M9902 Segmental and somatic dysfunction of thoracic region: Secondary | ICD-10-CM | POA: Diagnosis not present

## 2018-01-14 DIAGNOSIS — S134XXA Sprain of ligaments of cervical spine, initial encounter: Secondary | ICD-10-CM | POA: Diagnosis not present

## 2018-01-15 DIAGNOSIS — Z1212 Encounter for screening for malignant neoplasm of rectum: Secondary | ICD-10-CM | POA: Diagnosis not present

## 2018-01-15 DIAGNOSIS — I10 Essential (primary) hypertension: Secondary | ICD-10-CM | POA: Diagnosis not present

## 2018-01-15 DIAGNOSIS — Z0001 Encounter for general adult medical examination with abnormal findings: Secondary | ICD-10-CM | POA: Diagnosis not present

## 2018-01-21 DIAGNOSIS — S134XXA Sprain of ligaments of cervical spine, initial encounter: Secondary | ICD-10-CM | POA: Diagnosis not present

## 2018-01-21 DIAGNOSIS — M9901 Segmental and somatic dysfunction of cervical region: Secondary | ICD-10-CM | POA: Diagnosis not present

## 2018-01-21 DIAGNOSIS — M9903 Segmental and somatic dysfunction of lumbar region: Secondary | ICD-10-CM | POA: Diagnosis not present

## 2018-01-21 DIAGNOSIS — M9902 Segmental and somatic dysfunction of thoracic region: Secondary | ICD-10-CM | POA: Diagnosis not present

## 2018-01-21 DIAGNOSIS — S233XXA Sprain of ligaments of thoracic spine, initial encounter: Secondary | ICD-10-CM | POA: Diagnosis not present

## 2018-01-28 DIAGNOSIS — S233XXA Sprain of ligaments of thoracic spine, initial encounter: Secondary | ICD-10-CM | POA: Diagnosis not present

## 2018-01-28 DIAGNOSIS — S134XXA Sprain of ligaments of cervical spine, initial encounter: Secondary | ICD-10-CM | POA: Diagnosis not present

## 2018-01-28 DIAGNOSIS — M9901 Segmental and somatic dysfunction of cervical region: Secondary | ICD-10-CM | POA: Diagnosis not present

## 2018-01-28 DIAGNOSIS — M9902 Segmental and somatic dysfunction of thoracic region: Secondary | ICD-10-CM | POA: Diagnosis not present

## 2018-01-28 DIAGNOSIS — M9903 Segmental and somatic dysfunction of lumbar region: Secondary | ICD-10-CM | POA: Diagnosis not present

## 2018-02-10 DIAGNOSIS — N3000 Acute cystitis without hematuria: Secondary | ICD-10-CM | POA: Diagnosis not present

## 2018-02-11 DIAGNOSIS — M9903 Segmental and somatic dysfunction of lumbar region: Secondary | ICD-10-CM | POA: Diagnosis not present

## 2018-02-11 DIAGNOSIS — M9901 Segmental and somatic dysfunction of cervical region: Secondary | ICD-10-CM | POA: Diagnosis not present

## 2018-02-11 DIAGNOSIS — S233XXA Sprain of ligaments of thoracic spine, initial encounter: Secondary | ICD-10-CM | POA: Diagnosis not present

## 2018-02-11 DIAGNOSIS — S134XXA Sprain of ligaments of cervical spine, initial encounter: Secondary | ICD-10-CM | POA: Diagnosis not present

## 2018-02-11 DIAGNOSIS — M9902 Segmental and somatic dysfunction of thoracic region: Secondary | ICD-10-CM | POA: Diagnosis not present

## 2018-02-22 DIAGNOSIS — N3090 Cystitis, unspecified without hematuria: Secondary | ICD-10-CM | POA: Diagnosis not present

## 2018-03-04 DIAGNOSIS — S134XXA Sprain of ligaments of cervical spine, initial encounter: Secondary | ICD-10-CM | POA: Diagnosis not present

## 2018-03-04 DIAGNOSIS — M9901 Segmental and somatic dysfunction of cervical region: Secondary | ICD-10-CM | POA: Diagnosis not present

## 2018-03-04 DIAGNOSIS — M9902 Segmental and somatic dysfunction of thoracic region: Secondary | ICD-10-CM | POA: Diagnosis not present

## 2018-03-04 DIAGNOSIS — M9903 Segmental and somatic dysfunction of lumbar region: Secondary | ICD-10-CM | POA: Diagnosis not present

## 2018-03-04 DIAGNOSIS — S233XXA Sprain of ligaments of thoracic spine, initial encounter: Secondary | ICD-10-CM | POA: Diagnosis not present

## 2018-03-25 DIAGNOSIS — S134XXA Sprain of ligaments of cervical spine, initial encounter: Secondary | ICD-10-CM | POA: Diagnosis not present

## 2018-03-25 DIAGNOSIS — M9902 Segmental and somatic dysfunction of thoracic region: Secondary | ICD-10-CM | POA: Diagnosis not present

## 2018-03-25 DIAGNOSIS — S233XXA Sprain of ligaments of thoracic spine, initial encounter: Secondary | ICD-10-CM | POA: Diagnosis not present

## 2018-03-25 DIAGNOSIS — M9901 Segmental and somatic dysfunction of cervical region: Secondary | ICD-10-CM | POA: Diagnosis not present

## 2018-03-25 DIAGNOSIS — M9903 Segmental and somatic dysfunction of lumbar region: Secondary | ICD-10-CM | POA: Diagnosis not present

## 2018-04-19 DIAGNOSIS — E039 Hypothyroidism, unspecified: Secondary | ICD-10-CM | POA: Diagnosis not present

## 2018-04-19 DIAGNOSIS — I10 Essential (primary) hypertension: Secondary | ICD-10-CM | POA: Diagnosis not present

## 2018-04-19 DIAGNOSIS — E782 Mixed hyperlipidemia: Secondary | ICD-10-CM | POA: Diagnosis not present

## 2018-04-19 DIAGNOSIS — K219 Gastro-esophageal reflux disease without esophagitis: Secondary | ICD-10-CM | POA: Diagnosis not present

## 2018-04-22 DIAGNOSIS — M9903 Segmental and somatic dysfunction of lumbar region: Secondary | ICD-10-CM | POA: Diagnosis not present

## 2018-04-22 DIAGNOSIS — M9902 Segmental and somatic dysfunction of thoracic region: Secondary | ICD-10-CM | POA: Diagnosis not present

## 2018-04-22 DIAGNOSIS — S134XXA Sprain of ligaments of cervical spine, initial encounter: Secondary | ICD-10-CM | POA: Diagnosis not present

## 2018-04-22 DIAGNOSIS — S233XXA Sprain of ligaments of thoracic spine, initial encounter: Secondary | ICD-10-CM | POA: Diagnosis not present

## 2018-04-22 DIAGNOSIS — M9901 Segmental and somatic dysfunction of cervical region: Secondary | ICD-10-CM | POA: Diagnosis not present

## 2018-05-19 DIAGNOSIS — K219 Gastro-esophageal reflux disease without esophagitis: Secondary | ICD-10-CM | POA: Diagnosis not present

## 2018-05-19 DIAGNOSIS — I10 Essential (primary) hypertension: Secondary | ICD-10-CM | POA: Diagnosis not present

## 2018-05-19 DIAGNOSIS — E782 Mixed hyperlipidemia: Secondary | ICD-10-CM | POA: Diagnosis not present

## 2018-05-19 DIAGNOSIS — E039 Hypothyroidism, unspecified: Secondary | ICD-10-CM | POA: Diagnosis not present

## 2018-05-20 DIAGNOSIS — S134XXA Sprain of ligaments of cervical spine, initial encounter: Secondary | ICD-10-CM | POA: Diagnosis not present

## 2018-05-20 DIAGNOSIS — M9903 Segmental and somatic dysfunction of lumbar region: Secondary | ICD-10-CM | POA: Diagnosis not present

## 2018-05-20 DIAGNOSIS — M9902 Segmental and somatic dysfunction of thoracic region: Secondary | ICD-10-CM | POA: Diagnosis not present

## 2018-05-20 DIAGNOSIS — M9901 Segmental and somatic dysfunction of cervical region: Secondary | ICD-10-CM | POA: Diagnosis not present

## 2018-05-20 DIAGNOSIS — S233XXA Sprain of ligaments of thoracic spine, initial encounter: Secondary | ICD-10-CM | POA: Diagnosis not present

## 2018-05-28 DIAGNOSIS — L570 Actinic keratosis: Secondary | ICD-10-CM | POA: Diagnosis not present

## 2018-06-27 DIAGNOSIS — I10 Essential (primary) hypertension: Secondary | ICD-10-CM | POA: Diagnosis not present

## 2018-06-27 DIAGNOSIS — E782 Mixed hyperlipidemia: Secondary | ICD-10-CM | POA: Diagnosis not present

## 2018-06-27 DIAGNOSIS — I350 Nonrheumatic aortic (valve) stenosis: Secondary | ICD-10-CM | POA: Diagnosis not present

## 2018-06-27 DIAGNOSIS — K219 Gastro-esophageal reflux disease without esophagitis: Secondary | ICD-10-CM | POA: Diagnosis not present

## 2018-06-27 DIAGNOSIS — E039 Hypothyroidism, unspecified: Secondary | ICD-10-CM | POA: Diagnosis not present

## 2018-07-02 DIAGNOSIS — N183 Chronic kidney disease, stage 3 (moderate): Secondary | ICD-10-CM | POA: Diagnosis not present

## 2018-07-02 DIAGNOSIS — E200 Idiopathic hypoparathyroidism: Secondary | ICD-10-CM | POA: Diagnosis not present

## 2018-07-02 DIAGNOSIS — E039 Hypothyroidism, unspecified: Secondary | ICD-10-CM | POA: Diagnosis not present

## 2018-07-02 DIAGNOSIS — I10 Essential (primary) hypertension: Secondary | ICD-10-CM | POA: Diagnosis not present

## 2018-07-02 DIAGNOSIS — E782 Mixed hyperlipidemia: Secondary | ICD-10-CM | POA: Diagnosis not present

## 2018-07-09 DIAGNOSIS — D0461 Carcinoma in situ of skin of right upper limb, including shoulder: Secondary | ICD-10-CM | POA: Diagnosis not present

## 2018-07-09 DIAGNOSIS — C44622 Squamous cell carcinoma of skin of right upper limb, including shoulder: Secondary | ICD-10-CM | POA: Diagnosis not present

## 2018-07-15 DIAGNOSIS — S233XXA Sprain of ligaments of thoracic spine, initial encounter: Secondary | ICD-10-CM | POA: Diagnosis not present

## 2018-07-15 DIAGNOSIS — M9903 Segmental and somatic dysfunction of lumbar region: Secondary | ICD-10-CM | POA: Diagnosis not present

## 2018-07-15 DIAGNOSIS — M9902 Segmental and somatic dysfunction of thoracic region: Secondary | ICD-10-CM | POA: Diagnosis not present

## 2018-07-15 DIAGNOSIS — M9901 Segmental and somatic dysfunction of cervical region: Secondary | ICD-10-CM | POA: Diagnosis not present

## 2018-07-15 DIAGNOSIS — S134XXA Sprain of ligaments of cervical spine, initial encounter: Secondary | ICD-10-CM | POA: Diagnosis not present

## 2018-07-23 DIAGNOSIS — Z08 Encounter for follow-up examination after completed treatment for malignant neoplasm: Secondary | ICD-10-CM | POA: Diagnosis not present

## 2018-07-23 DIAGNOSIS — Z85828 Personal history of other malignant neoplasm of skin: Secondary | ICD-10-CM | POA: Diagnosis not present

## 2018-08-18 DIAGNOSIS — E782 Mixed hyperlipidemia: Secondary | ICD-10-CM | POA: Diagnosis not present

## 2018-08-18 DIAGNOSIS — I10 Essential (primary) hypertension: Secondary | ICD-10-CM | POA: Diagnosis not present

## 2018-09-18 DIAGNOSIS — E039 Hypothyroidism, unspecified: Secondary | ICD-10-CM | POA: Diagnosis not present

## 2018-09-18 DIAGNOSIS — E782 Mixed hyperlipidemia: Secondary | ICD-10-CM | POA: Diagnosis not present

## 2018-09-18 DIAGNOSIS — I10 Essential (primary) hypertension: Secondary | ICD-10-CM | POA: Diagnosis not present

## 2019-01-15 DIAGNOSIS — K219 Gastro-esophageal reflux disease without esophagitis: Secondary | ICD-10-CM | POA: Diagnosis not present

## 2019-01-15 DIAGNOSIS — E782 Mixed hyperlipidemia: Secondary | ICD-10-CM | POA: Diagnosis not present

## 2019-01-15 DIAGNOSIS — E039 Hypothyroidism, unspecified: Secondary | ICD-10-CM | POA: Diagnosis not present

## 2019-01-15 DIAGNOSIS — I350 Nonrheumatic aortic (valve) stenosis: Secondary | ICD-10-CM | POA: Diagnosis not present

## 2019-01-19 DIAGNOSIS — I2510 Atherosclerotic heart disease of native coronary artery without angina pectoris: Secondary | ICD-10-CM | POA: Diagnosis not present

## 2019-01-19 DIAGNOSIS — I10 Essential (primary) hypertension: Secondary | ICD-10-CM | POA: Diagnosis not present

## 2019-01-19 DIAGNOSIS — N183 Chronic kidney disease, stage 3 (moderate): Secondary | ICD-10-CM | POA: Diagnosis not present

## 2019-01-19 DIAGNOSIS — E200 Idiopathic hypoparathyroidism: Secondary | ICD-10-CM | POA: Diagnosis not present

## 2019-01-19 DIAGNOSIS — Z0001 Encounter for general adult medical examination with abnormal findings: Secondary | ICD-10-CM | POA: Diagnosis not present

## 2019-01-22 DIAGNOSIS — M9901 Segmental and somatic dysfunction of cervical region: Secondary | ICD-10-CM | POA: Diagnosis not present

## 2019-01-22 DIAGNOSIS — M9902 Segmental and somatic dysfunction of thoracic region: Secondary | ICD-10-CM | POA: Diagnosis not present

## 2019-01-22 DIAGNOSIS — M9903 Segmental and somatic dysfunction of lumbar region: Secondary | ICD-10-CM | POA: Diagnosis not present

## 2019-01-22 DIAGNOSIS — S233XXA Sprain of ligaments of thoracic spine, initial encounter: Secondary | ICD-10-CM | POA: Diagnosis not present

## 2019-01-22 DIAGNOSIS — S134XXA Sprain of ligaments of cervical spine, initial encounter: Secondary | ICD-10-CM | POA: Diagnosis not present

## 2019-01-29 DIAGNOSIS — S233XXA Sprain of ligaments of thoracic spine, initial encounter: Secondary | ICD-10-CM | POA: Diagnosis not present

## 2019-01-29 DIAGNOSIS — M9903 Segmental and somatic dysfunction of lumbar region: Secondary | ICD-10-CM | POA: Diagnosis not present

## 2019-01-29 DIAGNOSIS — M9901 Segmental and somatic dysfunction of cervical region: Secondary | ICD-10-CM | POA: Diagnosis not present

## 2019-01-29 DIAGNOSIS — S134XXA Sprain of ligaments of cervical spine, initial encounter: Secondary | ICD-10-CM | POA: Diagnosis not present

## 2019-01-29 DIAGNOSIS — M9902 Segmental and somatic dysfunction of thoracic region: Secondary | ICD-10-CM | POA: Diagnosis not present

## 2019-02-04 DIAGNOSIS — Z1211 Encounter for screening for malignant neoplasm of colon: Secondary | ICD-10-CM | POA: Diagnosis not present

## 2019-02-04 DIAGNOSIS — Z1212 Encounter for screening for malignant neoplasm of rectum: Secondary | ICD-10-CM | POA: Diagnosis not present

## 2019-02-12 DIAGNOSIS — M9902 Segmental and somatic dysfunction of thoracic region: Secondary | ICD-10-CM | POA: Diagnosis not present

## 2019-02-12 DIAGNOSIS — M9903 Segmental and somatic dysfunction of lumbar region: Secondary | ICD-10-CM | POA: Diagnosis not present

## 2019-02-12 DIAGNOSIS — S134XXA Sprain of ligaments of cervical spine, initial encounter: Secondary | ICD-10-CM | POA: Diagnosis not present

## 2019-02-12 DIAGNOSIS — S233XXA Sprain of ligaments of thoracic spine, initial encounter: Secondary | ICD-10-CM | POA: Diagnosis not present

## 2019-02-12 DIAGNOSIS — M9901 Segmental and somatic dysfunction of cervical region: Secondary | ICD-10-CM | POA: Diagnosis not present

## 2019-05-19 DIAGNOSIS — E039 Hypothyroidism, unspecified: Secondary | ICD-10-CM | POA: Diagnosis not present

## 2019-05-19 DIAGNOSIS — E782 Mixed hyperlipidemia: Secondary | ICD-10-CM | POA: Diagnosis not present

## 2019-05-19 DIAGNOSIS — E559 Vitamin D deficiency, unspecified: Secondary | ICD-10-CM | POA: Diagnosis not present

## 2019-05-19 DIAGNOSIS — N183 Chronic kidney disease, stage 3 unspecified: Secondary | ICD-10-CM | POA: Diagnosis not present

## 2019-05-25 DIAGNOSIS — I10 Essential (primary) hypertension: Secondary | ICD-10-CM | POA: Diagnosis not present

## 2019-05-25 DIAGNOSIS — R011 Cardiac murmur, unspecified: Secondary | ICD-10-CM | POA: Diagnosis not present

## 2019-05-25 DIAGNOSIS — R4582 Worries: Secondary | ICD-10-CM | POA: Diagnosis not present

## 2019-05-25 DIAGNOSIS — K5792 Diverticulitis of intestine, part unspecified, without perforation or abscess without bleeding: Secondary | ICD-10-CM | POA: Diagnosis not present

## 2019-05-25 DIAGNOSIS — I2510 Atherosclerotic heart disease of native coronary artery without angina pectoris: Secondary | ICD-10-CM | POA: Diagnosis not present

## 2019-05-25 DIAGNOSIS — E200 Idiopathic hypoparathyroidism: Secondary | ICD-10-CM | POA: Diagnosis not present

## 2019-07-07 DIAGNOSIS — E039 Hypothyroidism, unspecified: Secondary | ICD-10-CM | POA: Diagnosis not present

## 2019-09-17 DIAGNOSIS — K219 Gastro-esophageal reflux disease without esophagitis: Secondary | ICD-10-CM | POA: Diagnosis not present

## 2019-09-17 DIAGNOSIS — N183 Chronic kidney disease, stage 3 unspecified: Secondary | ICD-10-CM | POA: Diagnosis not present

## 2019-09-17 DIAGNOSIS — E782 Mixed hyperlipidemia: Secondary | ICD-10-CM | POA: Diagnosis not present

## 2019-09-17 DIAGNOSIS — E039 Hypothyroidism, unspecified: Secondary | ICD-10-CM | POA: Diagnosis not present

## 2019-09-17 DIAGNOSIS — I10 Essential (primary) hypertension: Secondary | ICD-10-CM | POA: Diagnosis not present

## 2019-09-22 DIAGNOSIS — I350 Nonrheumatic aortic (valve) stenosis: Secondary | ICD-10-CM | POA: Diagnosis not present

## 2019-09-22 DIAGNOSIS — R4582 Worries: Secondary | ICD-10-CM | POA: Diagnosis not present

## 2019-09-22 DIAGNOSIS — I2510 Atherosclerotic heart disease of native coronary artery without angina pectoris: Secondary | ICD-10-CM | POA: Diagnosis not present

## 2019-09-22 DIAGNOSIS — R011 Cardiac murmur, unspecified: Secondary | ICD-10-CM | POA: Diagnosis not present

## 2019-09-22 DIAGNOSIS — E200 Idiopathic hypoparathyroidism: Secondary | ICD-10-CM | POA: Diagnosis not present

## 2019-09-22 DIAGNOSIS — I10 Essential (primary) hypertension: Secondary | ICD-10-CM | POA: Diagnosis not present

## 2020-02-09 DIAGNOSIS — N183 Chronic kidney disease, stage 3 unspecified: Secondary | ICD-10-CM | POA: Diagnosis not present

## 2020-02-09 DIAGNOSIS — I10 Essential (primary) hypertension: Secondary | ICD-10-CM | POA: Diagnosis not present

## 2020-02-09 DIAGNOSIS — E200 Idiopathic hypoparathyroidism: Secondary | ICD-10-CM | POA: Diagnosis not present

## 2020-02-09 DIAGNOSIS — E782 Mixed hyperlipidemia: Secondary | ICD-10-CM | POA: Diagnosis not present

## 2020-02-09 DIAGNOSIS — Z0001 Encounter for general adult medical examination with abnormal findings: Secondary | ICD-10-CM | POA: Diagnosis not present

## 2020-02-12 DIAGNOSIS — I10 Essential (primary) hypertension: Secondary | ICD-10-CM | POA: Diagnosis not present

## 2020-02-12 DIAGNOSIS — E200 Idiopathic hypoparathyroidism: Secondary | ICD-10-CM | POA: Diagnosis not present

## 2020-02-12 DIAGNOSIS — I350 Nonrheumatic aortic (valve) stenosis: Secondary | ICD-10-CM | POA: Diagnosis not present

## 2020-02-12 DIAGNOSIS — R4582 Worries: Secondary | ICD-10-CM | POA: Diagnosis not present

## 2020-02-12 DIAGNOSIS — R079 Chest pain, unspecified: Secondary | ICD-10-CM | POA: Diagnosis not present

## 2020-02-12 DIAGNOSIS — Z0001 Encounter for general adult medical examination with abnormal findings: Secondary | ICD-10-CM | POA: Diagnosis not present

## 2020-02-12 DIAGNOSIS — I2510 Atherosclerotic heart disease of native coronary artery without angina pectoris: Secondary | ICD-10-CM | POA: Diagnosis not present

## 2020-03-09 DIAGNOSIS — I517 Cardiomegaly: Secondary | ICD-10-CM | POA: Diagnosis not present

## 2020-03-09 DIAGNOSIS — R010 Benign and innocent cardiac murmurs: Secondary | ICD-10-CM | POA: Diagnosis not present

## 2020-03-09 DIAGNOSIS — R079 Chest pain, unspecified: Secondary | ICD-10-CM | POA: Diagnosis not present

## 2020-03-09 DIAGNOSIS — I350 Nonrheumatic aortic (valve) stenosis: Secondary | ICD-10-CM | POA: Diagnosis not present

## 2020-03-09 DIAGNOSIS — I082 Rheumatic disorders of both aortic and tricuspid valves: Secondary | ICD-10-CM | POA: Diagnosis not present

## 2020-04-05 DIAGNOSIS — I10 Essential (primary) hypertension: Secondary | ICD-10-CM | POA: Diagnosis not present

## 2020-04-05 DIAGNOSIS — R0789 Other chest pain: Secondary | ICD-10-CM | POA: Diagnosis not present

## 2020-04-05 DIAGNOSIS — I350 Nonrheumatic aortic (valve) stenosis: Secondary | ICD-10-CM | POA: Diagnosis not present

## 2020-04-05 DIAGNOSIS — I25118 Atherosclerotic heart disease of native coronary artery with other forms of angina pectoris: Secondary | ICD-10-CM | POA: Diagnosis not present

## 2020-04-05 DIAGNOSIS — R0600 Dyspnea, unspecified: Secondary | ICD-10-CM | POA: Diagnosis not present

## 2020-06-09 DIAGNOSIS — E200 Idiopathic hypoparathyroidism: Secondary | ICD-10-CM | POA: Diagnosis not present

## 2020-06-09 DIAGNOSIS — R5382 Chronic fatigue, unspecified: Secondary | ICD-10-CM | POA: Diagnosis not present

## 2020-06-09 DIAGNOSIS — Z7689 Persons encountering health services in other specified circumstances: Secondary | ICD-10-CM | POA: Diagnosis not present

## 2020-06-09 DIAGNOSIS — E559 Vitamin D deficiency, unspecified: Secondary | ICD-10-CM | POA: Diagnosis not present

## 2020-06-09 DIAGNOSIS — E782 Mixed hyperlipidemia: Secondary | ICD-10-CM | POA: Diagnosis not present

## 2020-06-09 DIAGNOSIS — I350 Nonrheumatic aortic (valve) stenosis: Secondary | ICD-10-CM | POA: Diagnosis not present

## 2020-06-09 DIAGNOSIS — I10 Essential (primary) hypertension: Secondary | ICD-10-CM | POA: Diagnosis not present

## 2020-06-09 DIAGNOSIS — R4582 Worries: Secondary | ICD-10-CM | POA: Diagnosis not present

## 2020-06-09 DIAGNOSIS — E7849 Other hyperlipidemia: Secondary | ICD-10-CM | POA: Diagnosis not present

## 2020-06-09 DIAGNOSIS — I2510 Atherosclerotic heart disease of native coronary artery without angina pectoris: Secondary | ICD-10-CM | POA: Diagnosis not present

## 2020-06-28 DIAGNOSIS — I350 Nonrheumatic aortic (valve) stenosis: Secondary | ICD-10-CM | POA: Diagnosis not present

## 2020-06-28 DIAGNOSIS — I10 Essential (primary) hypertension: Secondary | ICD-10-CM | POA: Diagnosis not present

## 2020-06-28 DIAGNOSIS — I25118 Atherosclerotic heart disease of native coronary artery with other forms of angina pectoris: Secondary | ICD-10-CM | POA: Diagnosis not present

## 2020-07-08 DIAGNOSIS — I25118 Atherosclerotic heart disease of native coronary artery with other forms of angina pectoris: Secondary | ICD-10-CM | POA: Diagnosis not present

## 2020-07-11 DIAGNOSIS — Z7982 Long term (current) use of aspirin: Secondary | ICD-10-CM | POA: Diagnosis not present

## 2020-07-11 DIAGNOSIS — I10 Essential (primary) hypertension: Secondary | ICD-10-CM | POA: Diagnosis not present

## 2020-07-11 DIAGNOSIS — R42 Dizziness and giddiness: Secondary | ICD-10-CM | POA: Diagnosis not present

## 2020-07-11 DIAGNOSIS — I350 Nonrheumatic aortic (valve) stenosis: Secondary | ICD-10-CM | POA: Diagnosis not present

## 2020-07-11 DIAGNOSIS — Z79899 Other long term (current) drug therapy: Secondary | ICD-10-CM | POA: Diagnosis not present

## 2020-07-11 DIAGNOSIS — R0789 Other chest pain: Secondary | ICD-10-CM | POA: Diagnosis not present

## 2020-07-11 DIAGNOSIS — I2510 Atherosclerotic heart disease of native coronary artery without angina pectoris: Secondary | ICD-10-CM | POA: Diagnosis not present

## 2020-09-27 DIAGNOSIS — I10 Essential (primary) hypertension: Secondary | ICD-10-CM | POA: Diagnosis not present

## 2020-09-27 DIAGNOSIS — I25118 Atherosclerotic heart disease of native coronary artery with other forms of angina pectoris: Secondary | ICD-10-CM | POA: Diagnosis not present

## 2020-09-27 DIAGNOSIS — I350 Nonrheumatic aortic (valve) stenosis: Secondary | ICD-10-CM | POA: Diagnosis not present

## 2020-09-28 DIAGNOSIS — I10 Essential (primary) hypertension: Secondary | ICD-10-CM | POA: Diagnosis not present

## 2020-09-28 DIAGNOSIS — E782 Mixed hyperlipidemia: Secondary | ICD-10-CM | POA: Diagnosis not present

## 2020-09-28 DIAGNOSIS — R5383 Other fatigue: Secondary | ICD-10-CM | POA: Diagnosis not present

## 2020-09-28 DIAGNOSIS — E7849 Other hyperlipidemia: Secondary | ICD-10-CM | POA: Diagnosis not present

## 2020-09-28 DIAGNOSIS — E1165 Type 2 diabetes mellitus with hyperglycemia: Secondary | ICD-10-CM | POA: Diagnosis not present

## 2020-10-04 DIAGNOSIS — J301 Allergic rhinitis due to pollen: Secondary | ICD-10-CM | POA: Diagnosis not present

## 2020-10-04 DIAGNOSIS — Z7189 Other specified counseling: Secondary | ICD-10-CM | POA: Diagnosis not present

## 2020-10-04 DIAGNOSIS — E7849 Other hyperlipidemia: Secondary | ICD-10-CM | POA: Diagnosis not present

## 2020-10-04 DIAGNOSIS — E200 Idiopathic hypoparathyroidism: Secondary | ICD-10-CM | POA: Diagnosis not present

## 2020-10-04 DIAGNOSIS — I350 Nonrheumatic aortic (valve) stenosis: Secondary | ICD-10-CM | POA: Diagnosis not present

## 2020-10-04 DIAGNOSIS — I10 Essential (primary) hypertension: Secondary | ICD-10-CM | POA: Diagnosis not present

## 2020-10-04 DIAGNOSIS — I25111 Atherosclerotic heart disease of native coronary artery with angina pectoris with documented spasm: Secondary | ICD-10-CM | POA: Diagnosis not present

## 2020-11-01 DIAGNOSIS — H5213 Myopia, bilateral: Secondary | ICD-10-CM | POA: Diagnosis not present

## 2020-11-01 DIAGNOSIS — H2513 Age-related nuclear cataract, bilateral: Secondary | ICD-10-CM | POA: Diagnosis not present

## 2020-11-01 DIAGNOSIS — D863 Sarcoidosis of skin: Secondary | ICD-10-CM | POA: Diagnosis not present

## 2020-11-01 DIAGNOSIS — H52223 Regular astigmatism, bilateral: Secondary | ICD-10-CM | POA: Diagnosis not present

## 2020-11-01 DIAGNOSIS — H35371 Puckering of macula, right eye: Secondary | ICD-10-CM | POA: Diagnosis not present

## 2020-11-01 DIAGNOSIS — H16223 Keratoconjunctivitis sicca, not specified as Sjogren's, bilateral: Secondary | ICD-10-CM | POA: Diagnosis not present

## 2020-11-01 DIAGNOSIS — H524 Presbyopia: Secondary | ICD-10-CM | POA: Diagnosis not present

## 2021-02-09 DIAGNOSIS — E1122 Type 2 diabetes mellitus with diabetic chronic kidney disease: Secondary | ICD-10-CM | POA: Diagnosis not present

## 2021-02-09 DIAGNOSIS — N183 Chronic kidney disease, stage 3 unspecified: Secondary | ICD-10-CM | POA: Diagnosis not present

## 2021-02-09 DIAGNOSIS — E7849 Other hyperlipidemia: Secondary | ICD-10-CM | POA: Diagnosis not present

## 2021-02-09 DIAGNOSIS — E782 Mixed hyperlipidemia: Secondary | ICD-10-CM | POA: Diagnosis not present

## 2021-02-09 DIAGNOSIS — E039 Hypothyroidism, unspecified: Secondary | ICD-10-CM | POA: Diagnosis not present

## 2021-02-09 DIAGNOSIS — I10 Essential (primary) hypertension: Secondary | ICD-10-CM | POA: Diagnosis not present

## 2021-02-09 DIAGNOSIS — K219 Gastro-esophageal reflux disease without esophagitis: Secondary | ICD-10-CM | POA: Diagnosis not present

## 2021-02-09 DIAGNOSIS — E559 Vitamin D deficiency, unspecified: Secondary | ICD-10-CM | POA: Diagnosis not present

## 2021-02-13 DIAGNOSIS — I350 Nonrheumatic aortic (valve) stenosis: Secondary | ICD-10-CM | POA: Diagnosis not present

## 2021-02-13 DIAGNOSIS — I25111 Atherosclerotic heart disease of native coronary artery with angina pectoris with documented spasm: Secondary | ICD-10-CM | POA: Diagnosis not present

## 2021-02-13 DIAGNOSIS — R011 Cardiac murmur, unspecified: Secondary | ICD-10-CM | POA: Diagnosis not present

## 2021-02-13 DIAGNOSIS — I10 Essential (primary) hypertension: Secondary | ICD-10-CM | POA: Diagnosis not present

## 2021-02-13 DIAGNOSIS — Z0001 Encounter for general adult medical examination with abnormal findings: Secondary | ICD-10-CM | POA: Diagnosis not present

## 2021-02-13 DIAGNOSIS — E200 Idiopathic hypoparathyroidism: Secondary | ICD-10-CM | POA: Diagnosis not present

## 2021-02-13 DIAGNOSIS — E1122 Type 2 diabetes mellitus with diabetic chronic kidney disease: Secondary | ICD-10-CM | POA: Diagnosis not present

## 2021-02-13 DIAGNOSIS — E7849 Other hyperlipidemia: Secondary | ICD-10-CM | POA: Diagnosis not present

## 2021-02-14 DIAGNOSIS — E200 Idiopathic hypoparathyroidism: Secondary | ICD-10-CM | POA: Diagnosis not present

## 2021-02-14 DIAGNOSIS — E7849 Other hyperlipidemia: Secondary | ICD-10-CM | POA: Diagnosis not present

## 2021-02-14 DIAGNOSIS — Z0001 Encounter for general adult medical examination with abnormal findings: Secondary | ICD-10-CM | POA: Diagnosis not present

## 2021-02-14 DIAGNOSIS — I10 Essential (primary) hypertension: Secondary | ICD-10-CM | POA: Diagnosis not present

## 2021-02-14 DIAGNOSIS — I350 Nonrheumatic aortic (valve) stenosis: Secondary | ICD-10-CM | POA: Diagnosis not present

## 2021-02-14 DIAGNOSIS — I25111 Atherosclerotic heart disease of native coronary artery with angina pectoris with documented spasm: Secondary | ICD-10-CM | POA: Diagnosis not present

## 2021-02-14 DIAGNOSIS — R4582 Worries: Secondary | ICD-10-CM | POA: Diagnosis not present

## 2021-08-03 DIAGNOSIS — E782 Mixed hyperlipidemia: Secondary | ICD-10-CM | POA: Diagnosis not present

## 2021-08-03 DIAGNOSIS — N189 Chronic kidney disease, unspecified: Secondary | ICD-10-CM | POA: Diagnosis not present

## 2021-08-03 DIAGNOSIS — E7849 Other hyperlipidemia: Secondary | ICD-10-CM | POA: Diagnosis not present

## 2021-08-03 DIAGNOSIS — K219 Gastro-esophageal reflux disease without esophagitis: Secondary | ICD-10-CM | POA: Diagnosis not present

## 2021-08-03 DIAGNOSIS — I10 Essential (primary) hypertension: Secondary | ICD-10-CM | POA: Diagnosis not present

## 2021-08-03 DIAGNOSIS — N183 Chronic kidney disease, stage 3 unspecified: Secondary | ICD-10-CM | POA: Diagnosis not present

## 2021-08-08 DIAGNOSIS — D692 Other nonthrombocytopenic purpura: Secondary | ICD-10-CM | POA: Diagnosis not present

## 2021-08-08 DIAGNOSIS — I10 Essential (primary) hypertension: Secondary | ICD-10-CM | POA: Diagnosis not present

## 2021-08-08 DIAGNOSIS — I25111 Atherosclerotic heart disease of native coronary artery with angina pectoris with documented spasm: Secondary | ICD-10-CM | POA: Diagnosis not present

## 2021-08-08 DIAGNOSIS — R4582 Worries: Secondary | ICD-10-CM | POA: Diagnosis not present

## 2021-08-08 DIAGNOSIS — E7849 Other hyperlipidemia: Secondary | ICD-10-CM | POA: Diagnosis not present

## 2021-08-08 DIAGNOSIS — E200 Idiopathic hypoparathyroidism: Secondary | ICD-10-CM | POA: Diagnosis not present

## 2021-08-08 DIAGNOSIS — I350 Nonrheumatic aortic (valve) stenosis: Secondary | ICD-10-CM | POA: Diagnosis not present

## 2021-12-08 DIAGNOSIS — H2513 Age-related nuclear cataract, bilateral: Secondary | ICD-10-CM | POA: Diagnosis not present

## 2022-02-02 DIAGNOSIS — E1122 Type 2 diabetes mellitus with diabetic chronic kidney disease: Secondary | ICD-10-CM | POA: Diagnosis not present

## 2022-02-02 DIAGNOSIS — E7849 Other hyperlipidemia: Secondary | ICD-10-CM | POA: Diagnosis not present

## 2022-02-02 DIAGNOSIS — E039 Hypothyroidism, unspecified: Secondary | ICD-10-CM | POA: Diagnosis not present

## 2022-02-02 DIAGNOSIS — I10 Essential (primary) hypertension: Secondary | ICD-10-CM | POA: Diagnosis not present

## 2022-02-02 DIAGNOSIS — E559 Vitamin D deficiency, unspecified: Secondary | ICD-10-CM | POA: Diagnosis not present

## 2022-02-02 DIAGNOSIS — R5383 Other fatigue: Secondary | ICD-10-CM | POA: Diagnosis not present

## 2022-02-05 DIAGNOSIS — R300 Dysuria: Secondary | ICD-10-CM | POA: Diagnosis not present

## 2022-02-15 DIAGNOSIS — H18413 Arcus senilis, bilateral: Secondary | ICD-10-CM | POA: Diagnosis not present

## 2022-02-15 DIAGNOSIS — H25013 Cortical age-related cataract, bilateral: Secondary | ICD-10-CM | POA: Diagnosis not present

## 2022-02-15 DIAGNOSIS — H35372 Puckering of macula, left eye: Secondary | ICD-10-CM | POA: Diagnosis not present

## 2022-02-15 DIAGNOSIS — H2513 Age-related nuclear cataract, bilateral: Secondary | ICD-10-CM | POA: Diagnosis not present

## 2022-02-15 DIAGNOSIS — H2512 Age-related nuclear cataract, left eye: Secondary | ICD-10-CM | POA: Diagnosis not present

## 2022-02-17 DIAGNOSIS — Z0001 Encounter for general adult medical examination with abnormal findings: Secondary | ICD-10-CM | POA: Diagnosis not present

## 2022-02-17 DIAGNOSIS — E200 Idiopathic hypoparathyroidism: Secondary | ICD-10-CM | POA: Diagnosis not present

## 2022-02-17 DIAGNOSIS — D692 Other nonthrombocytopenic purpura: Secondary | ICD-10-CM | POA: Diagnosis not present

## 2022-02-17 DIAGNOSIS — E209 Hypoparathyroidism, unspecified: Secondary | ICD-10-CM | POA: Diagnosis not present

## 2022-02-17 DIAGNOSIS — I350 Nonrheumatic aortic (valve) stenosis: Secondary | ICD-10-CM | POA: Diagnosis not present

## 2022-02-17 DIAGNOSIS — I10 Essential (primary) hypertension: Secondary | ICD-10-CM | POA: Diagnosis not present

## 2022-02-17 DIAGNOSIS — J301 Allergic rhinitis due to pollen: Secondary | ICD-10-CM | POA: Diagnosis not present

## 2022-02-17 DIAGNOSIS — E7849 Other hyperlipidemia: Secondary | ICD-10-CM | POA: Diagnosis not present

## 2022-02-17 DIAGNOSIS — E039 Hypothyroidism, unspecified: Secondary | ICD-10-CM | POA: Diagnosis not present

## 2022-02-17 DIAGNOSIS — I25111 Atherosclerotic heart disease of native coronary artery with angina pectoris with documented spasm: Secondary | ICD-10-CM | POA: Diagnosis not present

## 2022-02-19 DIAGNOSIS — Z1211 Encounter for screening for malignant neoplasm of colon: Secondary | ICD-10-CM | POA: Diagnosis not present

## 2022-02-21 DIAGNOSIS — I10 Essential (primary) hypertension: Secondary | ICD-10-CM | POA: Diagnosis not present

## 2022-02-21 DIAGNOSIS — Z0001 Encounter for general adult medical examination with abnormal findings: Secondary | ICD-10-CM | POA: Diagnosis not present

## 2022-02-21 DIAGNOSIS — E1122 Type 2 diabetes mellitus with diabetic chronic kidney disease: Secondary | ICD-10-CM | POA: Diagnosis not present

## 2022-02-21 DIAGNOSIS — E039 Hypothyroidism, unspecified: Secondary | ICD-10-CM | POA: Diagnosis not present

## 2022-02-21 DIAGNOSIS — N183 Chronic kidney disease, stage 3 unspecified: Secondary | ICD-10-CM | POA: Diagnosis not present

## 2022-02-21 DIAGNOSIS — E7849 Other hyperlipidemia: Secondary | ICD-10-CM | POA: Diagnosis not present

## 2022-02-24 LAB — EXTERNAL GENERIC LAB PROCEDURE: COLOGUARD: NEGATIVE

## 2022-02-24 LAB — COLOGUARD: COLOGUARD: NEGATIVE

## 2022-03-06 DIAGNOSIS — I350 Nonrheumatic aortic (valve) stenosis: Secondary | ICD-10-CM | POA: Diagnosis not present

## 2022-03-06 DIAGNOSIS — I10 Essential (primary) hypertension: Secondary | ICD-10-CM | POA: Diagnosis not present

## 2022-03-06 DIAGNOSIS — I25118 Atherosclerotic heart disease of native coronary artery with other forms of angina pectoris: Secondary | ICD-10-CM | POA: Diagnosis not present

## 2022-03-09 DIAGNOSIS — M7062 Trochanteric bursitis, left hip: Secondary | ICD-10-CM | POA: Diagnosis not present

## 2022-03-09 DIAGNOSIS — M7061 Trochanteric bursitis, right hip: Secondary | ICD-10-CM | POA: Diagnosis not present

## 2022-05-02 DIAGNOSIS — H2512 Age-related nuclear cataract, left eye: Secondary | ICD-10-CM | POA: Diagnosis not present

## 2022-05-03 DIAGNOSIS — H2511 Age-related nuclear cataract, right eye: Secondary | ICD-10-CM | POA: Diagnosis not present

## 2022-05-03 DIAGNOSIS — H2512 Age-related nuclear cataract, left eye: Secondary | ICD-10-CM | POA: Diagnosis not present

## 2022-05-16 DIAGNOSIS — H2511 Age-related nuclear cataract, right eye: Secondary | ICD-10-CM | POA: Diagnosis not present

## 2022-05-17 DIAGNOSIS — H2511 Age-related nuclear cataract, right eye: Secondary | ICD-10-CM | POA: Diagnosis not present

## 2022-06-12 DIAGNOSIS — E1122 Type 2 diabetes mellitus with diabetic chronic kidney disease: Secondary | ICD-10-CM | POA: Diagnosis not present

## 2022-06-12 DIAGNOSIS — E7849 Other hyperlipidemia: Secondary | ICD-10-CM | POA: Diagnosis not present

## 2022-06-12 DIAGNOSIS — E209 Hypoparathyroidism, unspecified: Secondary | ICD-10-CM | POA: Diagnosis not present

## 2022-06-12 DIAGNOSIS — E559 Vitamin D deficiency, unspecified: Secondary | ICD-10-CM | POA: Diagnosis not present

## 2022-06-19 DIAGNOSIS — J301 Allergic rhinitis due to pollen: Secondary | ICD-10-CM | POA: Diagnosis not present

## 2022-06-19 DIAGNOSIS — R4582 Worries: Secondary | ICD-10-CM | POA: Diagnosis not present

## 2022-06-19 DIAGNOSIS — E209 Hypoparathyroidism, unspecified: Secondary | ICD-10-CM | POA: Diagnosis not present

## 2022-06-19 DIAGNOSIS — D692 Other nonthrombocytopenic purpura: Secondary | ICD-10-CM | POA: Diagnosis not present

## 2022-06-19 DIAGNOSIS — I10 Essential (primary) hypertension: Secondary | ICD-10-CM | POA: Diagnosis not present

## 2022-06-19 DIAGNOSIS — E200 Idiopathic hypoparathyroidism: Secondary | ICD-10-CM | POA: Diagnosis not present

## 2022-06-19 DIAGNOSIS — E039 Hypothyroidism, unspecified: Secondary | ICD-10-CM | POA: Diagnosis not present

## 2022-06-19 DIAGNOSIS — E7849 Other hyperlipidemia: Secondary | ICD-10-CM | POA: Diagnosis not present

## 2022-06-19 DIAGNOSIS — I350 Nonrheumatic aortic (valve) stenosis: Secondary | ICD-10-CM | POA: Diagnosis not present

## 2022-06-19 DIAGNOSIS — I25111 Atherosclerotic heart disease of native coronary artery with angina pectoris with documented spasm: Secondary | ICD-10-CM | POA: Diagnosis not present

## 2022-09-04 DIAGNOSIS — I350 Nonrheumatic aortic (valve) stenosis: Secondary | ICD-10-CM | POA: Diagnosis not present

## 2022-09-04 DIAGNOSIS — I10 Essential (primary) hypertension: Secondary | ICD-10-CM | POA: Diagnosis not present

## 2022-09-04 DIAGNOSIS — I25118 Atherosclerotic heart disease of native coronary artery with other forms of angina pectoris: Secondary | ICD-10-CM | POA: Diagnosis not present

## 2022-09-26 DIAGNOSIS — I350 Nonrheumatic aortic (valve) stenosis: Secondary | ICD-10-CM | POA: Diagnosis not present

## 2022-09-26 DIAGNOSIS — I082 Rheumatic disorders of both aortic and tricuspid valves: Secondary | ICD-10-CM | POA: Diagnosis not present

## 2022-09-26 DIAGNOSIS — I5189 Other ill-defined heart diseases: Secondary | ICD-10-CM | POA: Diagnosis not present

## 2022-09-26 DIAGNOSIS — I517 Cardiomegaly: Secondary | ICD-10-CM | POA: Diagnosis not present

## 2022-10-16 DIAGNOSIS — E7849 Other hyperlipidemia: Secondary | ICD-10-CM | POA: Diagnosis not present

## 2022-10-16 DIAGNOSIS — I10 Essential (primary) hypertension: Secondary | ICD-10-CM | POA: Diagnosis not present

## 2022-10-16 DIAGNOSIS — E559 Vitamin D deficiency, unspecified: Secondary | ICD-10-CM | POA: Diagnosis not present

## 2022-10-16 DIAGNOSIS — K219 Gastro-esophageal reflux disease without esophagitis: Secondary | ICD-10-CM | POA: Diagnosis not present

## 2022-10-19 DIAGNOSIS — E209 Hypoparathyroidism, unspecified: Secondary | ICD-10-CM | POA: Diagnosis not present

## 2022-10-19 DIAGNOSIS — I10 Essential (primary) hypertension: Secondary | ICD-10-CM | POA: Diagnosis not present

## 2022-10-19 DIAGNOSIS — K2100 Gastro-esophageal reflux disease with esophagitis, without bleeding: Secondary | ICD-10-CM | POA: Diagnosis not present

## 2022-10-19 DIAGNOSIS — R4582 Worries: Secondary | ICD-10-CM | POA: Diagnosis not present

## 2022-10-19 DIAGNOSIS — E200 Idiopathic hypoparathyroidism: Secondary | ICD-10-CM | POA: Diagnosis not present

## 2022-10-19 DIAGNOSIS — D692 Other nonthrombocytopenic purpura: Secondary | ICD-10-CM | POA: Diagnosis not present

## 2022-10-19 DIAGNOSIS — I25111 Atherosclerotic heart disease of native coronary artery with angina pectoris with documented spasm: Secondary | ICD-10-CM | POA: Diagnosis not present

## 2022-10-19 DIAGNOSIS — E039 Hypothyroidism, unspecified: Secondary | ICD-10-CM | POA: Diagnosis not present

## 2022-10-19 DIAGNOSIS — I350 Nonrheumatic aortic (valve) stenosis: Secondary | ICD-10-CM | POA: Diagnosis not present

## 2022-10-19 DIAGNOSIS — E7849 Other hyperlipidemia: Secondary | ICD-10-CM | POA: Diagnosis not present

## 2022-10-29 ENCOUNTER — Encounter: Payer: Self-pay | Admitting: Internal Medicine

## 2022-10-29 ENCOUNTER — Ambulatory Visit: Payer: Medicare Other | Attending: Internal Medicine | Admitting: Internal Medicine

## 2022-10-29 VITALS — BP 142/70 | HR 60 | Ht 67.0 in | Wt 198.0 lb

## 2022-10-29 DIAGNOSIS — R0609 Other forms of dyspnea: Secondary | ICD-10-CM | POA: Diagnosis not present

## 2022-10-29 DIAGNOSIS — I10 Essential (primary) hypertension: Secondary | ICD-10-CM

## 2022-10-29 DIAGNOSIS — E785 Hyperlipidemia, unspecified: Secondary | ICD-10-CM

## 2022-10-29 DIAGNOSIS — I350 Nonrheumatic aortic (valve) stenosis: Secondary | ICD-10-CM | POA: Diagnosis not present

## 2022-10-29 NOTE — Addendum Note (Signed)
Addended by: Jacquelin Hawking on: 10/29/2022 10:45 AM   Modules accepted: Orders

## 2022-10-29 NOTE — Progress Notes (Signed)
Patient ID: TERRENCE ABDULKARIM MRN: [id number] DOB/AGE: 11/23/1945 77 y.o.  Primary Care Physician:Daniel, Lucita Lora, MD Primary Cardiologist: Hubert Azure, MD   FOCUSED CARDIOVASCULAR PROBLEM LIST:   1.  Moderate to severe aortic stenosis with an aortic valve area 0.8 cm grade, mean gradient 24 mmHg, DVI 0.26, maximal velocity of 3.3 m/s with ejection fraction of 60 to 65% on outside hospital echocardiogram May 2024: EKG without bundle-branch blocks 2.  Mild nonobstructive coronary artery disease on cardiac catheterization 2022 3.  Hypertension 4.  Hyperlipidemia  5.  Osteoarthritis   HISTORY OF PRESENT ILLNESS: The patient is a 77 y.o. female with the indicated medical history here for recommendations regarding her aortic stenosis.  She was seen by her primary cardiologist Dr. Sharrell Ku in April of this year.  At that appointment she noted increasing exertional chest discomfort, dizziness, and fatigue.  An echocardiogram was performed which demonstrated moderate to severe calcific aortic stenosis with preserved ejection fraction.  The patient tells me that compared to a few years ago she believes she has slowed down somewhat but this is obscured by the fact that she has osteoarthritis and she feels that a lot of this is due to her age.  She is unable to tell me whether she gets all that much more short of breath versus a few years ago.  She does need to take breaks when she mows her lawn and does housework.  She denies any exertional angina.  She denies any frank syncope.  She occasionally gets lightheaded when she goes from sitting to standing but this is very rare.  She fortunately has not required any emergency room visits or hospitalizations recently.  She underwent cardiac catheterization a few years ago through the femoral approach which was complicated by hematoma.  She is very concerned about this.  She tells me that she does need dental work and is planning on getting this addressed in the  coming months.  Past Medical History:  Diagnosis Date   Arthritis    Follicular thyroid carcinoma (HCC)    History of chicken pox    Hypertension    Hypocalcemia    Sarcoidosis    Vitiligo     Past Surgical History:  Procedure Laterality Date   ABDOMINAL HYSTERECTOMY  10/1984   CHOLECYSTECTOMY  07/1992   three weeks after gallstone removal   Gallstone removal from Bile Duct  07/1992   TOTAL THYROIDECTOMY  12/2011   TUBAL LIGATION  10/1980   TUMOR EXCISION  05/1972   left side of thyroid    Family History  Problem Relation Age of Onset   Heart disease Mother    Stroke Mother    Hypertension Mother    Diabetes Mother    Heart disease Father    Diabetes Maternal Grandmother     Social History   Socioeconomic History   Marital status: Married    Spouse name: Not on file   Number of children: 1   Years of education: 14   Highest education level: Not on file  Occupational History   Occupation: Retired  Tobacco Use   Smoking status: Never   Smokeless tobacco: Never  Substance and Sexual Activity   Alcohol use: No   Drug use: Not on file   Sexual activity: Not on file  Other Topics Concern   Not on file  Social History Narrative   Regular exercise-yes   Caffeine Use-no   Social Determinants of Health   Financial Resource Strain: Not  on file  Food Insecurity: Not on file  Transportation Needs: Not on file  Physical Activity: Not on file  Stress: Not on file  Social Connections: Not on file  Intimate Partner Violence: Not on file     Prior to Admission medications   Medication Sig Start Date End Date Taking? Authorizing Provider  calcitRIOL (ROCALTROL) 0.25 MCG capsule Take 0.25 mcg by mouth 2 (two) times daily.  01/23/12   [provider]  calcium-vitamin D (OSCAL WITH D) 500-200 MG-UNIT per tablet Take 1 tablet by mouth 4 (four) times daily.    [provider]  diphenhydrAMINE (BENADRYL) 25 MG tablet Take 25 mg by mouth at bedtime.     [provider]  estradiol (ESTRACE) 1 MG tablet Take 1 mg by mouth daily.  01/04/12   [provider]  lisinopril-hydrochlorothiazide (PRINZIDE,ZESTORETIC) 20-12.5 MG per tablet Take 1 tablet by mouth daily.  02/02/12   [provider]  LORazepam (ATIVAN) 0.5 MG tablet Take 0.5 mg by mouth every 8 (eight) hours as needed.  12/14/11   [provider]  Naproxen Sodium 220 MG CAPS Take 2 capsules by mouth at bedtime.    [provider]    Allergies  Allergen Reactions   Amoxicillin Nausea And Vomiting   Codeine    Diltiazem Other (See Comments)    "Bad headache"    REVIEW OF SYSTEMS:  General: no fevers/chills/night sweats Eyes: no blurry vision, diplopia, or amaurosis ENT: no sore throat or hearing loss Resp: no cough, wheezing, or hemoptysis CV: no edema or palpitations GI: no abdominal pain, nausea, vomiting, diarrhea, or constipation GU: no dysuria, frequency, or hematuria Skin: no rash Neuro: no headache, numbness, tingling, or weakness of extremities Musculoskeletal: no joint pain or swelling Heme: no bleeding, DVT, or easy bruising Endo: no polydipsia or polyuria  BP (!) 142/70   Pulse 60   Ht 5\' 7"  (1.702 m)   Wt 198 lb (89.8 kg)   BMI 31.01 kg/m   PHYSICAL EXAM: GEN:  AO x 3 in no acute distress HEENT: normal Dentition: Poor Neck: JVP normal. +2 carotid upstrokes without bruits. No thyromegaly. Lungs: equal expansion, clear bilaterally CV: Apex is discrete and nondisplaced, RRR with 3/6 SEM Abd: soft, non-tender, non-distended; no bruit; positive bowel sounds Ext: no edema, ecchymoses, or cyanosis Vascular: 2+ femoral pulses, 2+ radial pulses       Skin: warm and dry without rash Neuro: CN II-XII grossly intact; motor and sensory grossly intact    DATA AND STUDIES:  EKG: Sinus rhythm with rare PVC  2D ECHO: OSH TTE 2024 as detailed above  CARDIAC CATH: 2022 [facility] 1. Moderate aortic valve stenosis.  2. Mild  non-obstructive CAD.  3. Normal right heart cath pressures.  4. Preserved cardiac output.   STS RISK CALCULATOR: Pending  NHYA CLASS: 2    ASSESSMENT AND PLAN:   Nonrheumatic aortic valve stenosis - Plan: ECHOCARDIOGRAM COMPLETE  Dyspnea on exertion  Hyperlipidemia LDL goal <70  Primary hypertension  The patient has developed likely severe aortic stenosis with NYHA class II symptoms.  I do long conversation with the patient about pursuing an evaluation.  She tells me that in her opinion she does not feel all that much worse than a few years ago.  At this time she would like to monitor her symptoms and when worse she would like to pursue an evaluation.  I did tell her we would need coronary angiography and a TAVR protocol CT as  well as a cardiothoracic surgical evaluation.  She will go ahead and get her teeth managed by her dentist in preparation for potential aortic valve intervention.  I told her to contact our office for any worsening shortness of breath, exertional angina, worsening presyncope, or frank syncope.  She understands this completely.  I will see her back in 6 months with an echocardiogram.  I have personally reviewed the patients imaging data as summarized above.  I have reviewed the natural history of aortic stenosis with the patient and family members who are present today. We have discussed the limitations of medical therapy and the poor prognosis associated with symptomatic aortic stenosis. We have also reviewed potential treatment options, including palliative medical therapy, conventional surgical aortic valve replacement, and transcatheter aortic valve replacement. We discussed treatment options in the context of this patient's specific comorbid medical conditions.   All of the patient's questions were answered today. Will make further recommendations based on the results of studies outlined above.   Total time spent with patient today 40 minutes. This includes  reviewing records, evaluating the patient and coordinating care.   Orbie Pyo, MD  10/29/2022 9:19 AM    [facility] [address] [place], [place], [place]  [address] Phone: [phone]; Fax: [phone]

## 2022-10-29 NOTE — Patient Instructions (Signed)
Medication Instructions:  No changes *If you need a refill on your cardiac medications before your next appointment, please call your pharmacy*   Lab Work: none If you have labs (blood work) drawn today and your tests are completely normal, you will receive your results only by: MyChart Message (if you have MyChart) OR A paper copy in the mail If you have any lab test that is abnormal or we need to change your treatment, we will call you to review the results.   Testing/Procedures: Echocardiogram in December 2024.    Follow-Up: 6 months with Dr. Lynnette Caffey - echo same day

## 2022-11-26 DIAGNOSIS — H26493 Other secondary cataract, bilateral: Secondary | ICD-10-CM | POA: Diagnosis not present

## 2023-02-19 DIAGNOSIS — K2100 Gastro-esophageal reflux disease with esophagitis, without bleeding: Secondary | ICD-10-CM | POA: Diagnosis not present

## 2023-02-19 DIAGNOSIS — N183 Chronic kidney disease, stage 3 unspecified: Secondary | ICD-10-CM | POA: Diagnosis not present

## 2023-02-19 DIAGNOSIS — E209 Hypoparathyroidism, unspecified: Secondary | ICD-10-CM | POA: Diagnosis not present

## 2023-02-19 DIAGNOSIS — E1165 Type 2 diabetes mellitus with hyperglycemia: Secondary | ICD-10-CM | POA: Diagnosis not present

## 2023-02-19 DIAGNOSIS — E1122 Type 2 diabetes mellitus with diabetic chronic kidney disease: Secondary | ICD-10-CM | POA: Diagnosis not present

## 2023-02-19 DIAGNOSIS — E7849 Other hyperlipidemia: Secondary | ICD-10-CM | POA: Diagnosis not present

## 2023-02-27 DIAGNOSIS — E039 Hypothyroidism, unspecified: Secondary | ICD-10-CM | POA: Diagnosis not present

## 2023-02-27 DIAGNOSIS — K4020 Bilateral inguinal hernia, without obstruction or gangrene, not specified as recurrent: Secondary | ICD-10-CM | POA: Diagnosis not present

## 2023-02-27 DIAGNOSIS — I10 Essential (primary) hypertension: Secondary | ICD-10-CM | POA: Diagnosis not present

## 2023-02-27 DIAGNOSIS — Z0001 Encounter for general adult medical examination with abnormal findings: Secondary | ICD-10-CM | POA: Diagnosis not present

## 2023-02-27 DIAGNOSIS — K449 Diaphragmatic hernia without obstruction or gangrene: Secondary | ICD-10-CM | POA: Diagnosis not present

## 2023-02-27 DIAGNOSIS — M7061 Trochanteric bursitis, right hip: Secondary | ICD-10-CM | POA: Diagnosis not present

## 2023-02-27 DIAGNOSIS — I350 Nonrheumatic aortic (valve) stenosis: Secondary | ICD-10-CM | POA: Diagnosis not present

## 2023-02-27 DIAGNOSIS — E7849 Other hyperlipidemia: Secondary | ICD-10-CM | POA: Diagnosis not present

## 2023-02-27 DIAGNOSIS — E209 Hypoparathyroidism, unspecified: Secondary | ICD-10-CM | POA: Diagnosis not present

## 2023-02-27 DIAGNOSIS — I25111 Atherosclerotic heart disease of native coronary artery with angina pectoris with documented spasm: Secondary | ICD-10-CM | POA: Diagnosis not present

## 2023-02-27 DIAGNOSIS — R4582 Worries: Secondary | ICD-10-CM | POA: Diagnosis not present

## 2023-02-27 DIAGNOSIS — K5732 Diverticulitis of large intestine without perforation or abscess without bleeding: Secondary | ICD-10-CM | POA: Diagnosis not present

## 2023-02-27 DIAGNOSIS — Z23 Encounter for immunization: Secondary | ICD-10-CM | POA: Diagnosis not present

## 2023-02-27 DIAGNOSIS — R1032 Left lower quadrant pain: Secondary | ICD-10-CM | POA: Diagnosis not present

## 2023-02-27 DIAGNOSIS — E200 Idiopathic hypoparathyroidism: Secondary | ICD-10-CM | POA: Diagnosis not present

## 2023-03-23 DIAGNOSIS — J301 Allergic rhinitis due to pollen: Secondary | ICD-10-CM | POA: Diagnosis not present

## 2023-03-23 DIAGNOSIS — L570 Actinic keratosis: Secondary | ICD-10-CM | POA: Diagnosis not present

## 2023-03-23 DIAGNOSIS — E209 Hypoparathyroidism, unspecified: Secondary | ICD-10-CM | POA: Diagnosis not present

## 2023-03-23 DIAGNOSIS — E039 Hypothyroidism, unspecified: Secondary | ICD-10-CM | POA: Diagnosis not present

## 2023-03-23 DIAGNOSIS — E200 Idiopathic hypoparathyroidism: Secondary | ICD-10-CM | POA: Diagnosis not present

## 2023-03-23 DIAGNOSIS — E7849 Other hyperlipidemia: Secondary | ICD-10-CM | POA: Diagnosis not present

## 2023-03-23 DIAGNOSIS — I25111 Atherosclerotic heart disease of native coronary artery with angina pectoris with documented spasm: Secondary | ICD-10-CM | POA: Diagnosis not present

## 2023-03-23 DIAGNOSIS — M7061 Trochanteric bursitis, right hip: Secondary | ICD-10-CM | POA: Diagnosis not present

## 2023-03-23 DIAGNOSIS — I10 Essential (primary) hypertension: Secondary | ICD-10-CM | POA: Diagnosis not present

## 2023-03-23 DIAGNOSIS — I350 Nonrheumatic aortic (valve) stenosis: Secondary | ICD-10-CM | POA: Diagnosis not present

## 2023-03-23 DIAGNOSIS — D692 Other nonthrombocytopenic purpura: Secondary | ICD-10-CM | POA: Diagnosis not present

## 2023-04-28 NOTE — H&P (View-Only) (Signed)
 Patient ID: Samantha Wood MRN: [id number] DOB/AGE: [birth year] 77 y.o.  Primary Care Physician:Daniel, Lucita Lora, MD Primary Cardiologist: Hubert Azure, MD   FOCUSED CARDIOVASCULAR PROBLEM LIST:   Aortic stenosis; EKG without bundle-branch blocks MG 24, DVI 0.26, AVA 0.8, V-max 3.3 TTE May 2024 MG 32, AVA 0.91, V-max 3.5 TTE December 2024 Coronary artery disease Mild, cardiac catheterization 2022 Hypertension Hyperlipidemia CKD stage IIIb Osteoarthritis  HISTORY OF PRESENT ILLNESS:  June 2024:  The patient is a 77 y.o. female with the indicated medical history here for recommendations regarding her aortic stenosis.  She was seen by her primary cardiologist Dr. Sharrell Ku in April of this year.  At that appointment she noted increasing exertional chest discomfort, dizziness, and fatigue.  An echocardiogram was performed which demonstrated moderate to severe calcific aortic stenosis with preserved ejection fraction.  The patient tells me that compared to a few years ago she believes she has slowed down somewhat but this is obscured by the fact that she has osteoarthritis and she feels that a lot of this is due to her age.  She is unable to tell me whether she gets all that much more short of breath versus a few years ago.  She does need to take breaks when she mows her lawn and does housework.  She denies any exertional angina.  She denies any frank syncope.  She occasionally gets lightheaded when she goes from sitting to standing but this is very rare.  She fortunately has not required any emergency room visits or hospitalizations recently.  She underwent cardiac catheterization a few years ago through the femoral approach which was complicated by hematoma.  She is very concerned about this.  She tells me that she does need dental work and is planning on getting this addressed in the coming months.  Plan: Despite NYHA class II symptoms the patient elected to defer evaluation; follow-up in 6  months and patient to pursue dental evaluation.  December 2024: TTE today demonstrated a mean aortic valve gradient of around 32 mmHg with a peak velocity of 3.5 m/s with preserved LV function consistent with moderate to severe aortic stenosis with an aortic valve area of 0.91.  The patient is much more symptomatic.  She tires out quite often and endorses NYHA class II symptoms of shortness of breath and fatigue.  She occasionally develops chest tightness.  She occasionally gets lightheaded when she goes from sitting to standing quickly but fortunately she has not had any syncope.  She fortunately does not required any emergency room visits.  She did see her dentist and had her dental issues treated completely.  Past Medical History:  Diagnosis Date   Arthritis    Follicular thyroid carcinoma (HCC)    History of chicken pox    Hypertension    Hypocalcemia    Sarcoidosis    Vitiligo     Past Surgical History:  Procedure Laterality Date   ABDOMINAL HYSTERECTOMY  10/1984   CHOLECYSTECTOMY  07/1992   three weeks after gallstone removal   Gallstone removal from Bile Duct  07/1992   TOTAL THYROIDECTOMY  12/2011   TUBAL LIGATION  10/1980   TUMOR EXCISION  05/1972   left side of thyroid    Family History  Problem Relation Age of Onset   Heart disease Mother    Stroke Mother    Hypertension Mother    Diabetes Mother    Heart disease Father    Diabetes Maternal Grandmother  Social History   Socioeconomic History   Marital status: Single    Spouse name: Not on file   Number of children: 1   Years of education: 14   Highest education level: Not on file  Occupational History   Occupation: Retired  Tobacco Use   Smoking status: Never   Smokeless tobacco: Never  Substance and Sexual Activity   Alcohol use: No   Drug use: Not on file   Sexual activity: Not on file  Other Topics Concern   Not on file  Social History Narrative   Regular exercise-yes   Caffeine Use-no    Social Drivers of Health   Financial Resource Strain: Not on file  Food Insecurity: Not on file  Transportation Needs: Not on file  Physical Activity: Not on file  Stress: Not on file  Social Connections: Not on file  Intimate Partner Violence: Not on file     Prior to Admission medications   Medication Sig Start Date End Date Taking? Authorizing Provider  calcitRIOL (ROCALTROL) 0.25 MCG capsule Take 0.25 mcg by mouth 2 (two) times daily.  01/23/12   [provider]  calcium-vitamin D (OSCAL WITH D) 500-200 MG-UNIT per tablet Take 1 tablet by mouth 4 (four) times daily.    [provider]  diphenhydrAMINE (BENADRYL) 25 MG tablet Take 25 mg by mouth at bedtime.    [provider]  estradiol (ESTRACE) 1 MG tablet Take 1 mg by mouth daily.  01/04/12   [provider]  lisinopril-hydrochlorothiazide (PRINZIDE,ZESTORETIC) 20-12.5 MG per tablet Take 1 tablet by mouth daily.  02/02/12   [provider]  LORazepam (ATIVAN) 0.5 MG tablet Take 0.5 mg by mouth every 8 (eight) hours as needed.  12/14/11   [provider]  Naproxen Sodium 220 MG CAPS Take 2 capsules by mouth at bedtime.    [provider]    Allergies  Allergen Reactions   Amoxicillin Nausea And Vomiting   Codeine    Diltiazem Other (See Comments)    "Bad headache"    REVIEW OF SYSTEMS:  General: no fevers/chills/night sweats Eyes: no blurry vision, diplopia, or amaurosis ENT: no sore throat or hearing loss Resp: no cough, wheezing, or hemoptysis CV: no edema or palpitations GI: no abdominal pain, nausea, vomiting, diarrhea, or constipation GU: no dysuria, frequency, or hematuria Skin: no rash Neuro: no headache, numbness, tingling, or weakness of extremities Musculoskeletal: no joint pain or swelling Heme: no bleeding, DVT, or easy bruising Endo: no polydipsia or polyuria  BP (!) 142/76   Pulse 60   Ht 5\' 7"  (1.702 m)   Wt 198 lb (89.8 kg)   SpO2 96%    BMI 31.01 kg/m   PHYSICAL EXAM: GEN:  AO x 3 in no acute distress HEENT: normal Dentition: Poor Neck: JVP normal. +2 carotid upstrokes without bruits. No thyromegaly. Lungs: equal expansion, clear bilaterally CV: Apex is discrete and nondisplaced, RRR with 3/6 SEM Abd: soft, non-tender, non-distended; no bruit; positive bowel sounds Ext: no edema, ecchymoses, or cyanosis Vascular: 2+ femoral pulses, 2+ radial pulses       Skin: warm and dry without rash Neuro: CN II-XII grossly intact; motor and sensory grossly intact    DATA AND STUDIES:  EKG: Sinus rhythm with rare PVC  2D ECHO: OSH TTE 2024 as detailed above  CARDIAC CATH: 2022 [facility] 1. Moderate aortic valve stenosis.  2. Mild non-obstructive CAD.  3. Normal right heart cath pressures.  4. Preserved cardiac output.  STS RISK CALCULATOR: Pending  NHYA CLASS: 2    ASSESSMENT AND PLAN:   Nonrheumatic aortic valve stenosis - Plan: EKG 12-Lead, CBC w/Diff, Basic metabolic panel, Basic metabolic panel, CBC w/Diff  Dyspnea on exertion - Plan: EKG 12-Lead, CBC w/Diff, Basic metabolic panel, Basic metabolic panel, CBC w/Diff  Hyperlipidemia LDL goal <70 - Plan: EKG 12-Lead, CBC w/Diff, Basic metabolic panel, Basic metabolic panel, CBC w/Diff  Primary hypertension - Plan: EKG 12-Lead, CBC w/Diff, Basic metabolic panel, Basic metabolic panel, CBC w/Diff  Stage 3b chronic kidney disease (HCC) - Plan: EKG 12-Lead, CBC w/Diff, Basic metabolic panel, Basic metabolic panel, CBC w/Diff  Pre-procedure lab exam - Plan: CBC w/Diff, Basic metabolic panel, Basic metabolic panel, CBC w/Diff  The patient has developed NYHA class II symptoms of shortness of breath.  I will refer her for preprocedural coronary angiography and right heart catheterization study.  Additionally I will follow-up her echocardiogram.  I am interested to see whether she would be a candidate for the continuing access program of the PROGRESS trial.  Further  recommendations will be issued once the echocardiogram has been reviewed.   I have personally reviewed the patients imaging data as summarized above.  I have reviewed the natural history of aortic stenosis with the patient and family members who are present today. We have discussed the limitations of medical therapy and the poor prognosis associated with symptomatic aortic stenosis. We have also reviewed potential treatment options, including palliative medical therapy, conventional surgical aortic valve replacement, and transcatheter aortic valve replacement. We discussed treatment options in the context of this patient's specific comorbid medical conditions.   All of the patient's questions were answered today. Will make further recommendations based on the results of studies outlined above.   Total time spent with patient today 55 minutes. This includes reviewing records, evaluating the patient and coordinating care.   Orbie Pyo, MD  05/03/2023 1:20 PM    [facility] [address] [place], [place], [place]  [address] Phone: [phone]; Fax: [phone]

## 2023-04-28 NOTE — Progress Notes (Signed)
Patient ID: Samantha Wood MRN: [id number] DOB/AGE: [birth year] 77 y.o.  Primary Care Physician:Daniel, Lucita Lora, MD Primary Cardiologist: Hubert Azure, MD   FOCUSED CARDIOVASCULAR PROBLEM LIST:   Aortic stenosis; EKG without bundle-branch blocks MG 24, DVI 0.26, AVA 0.8, V-max 3.3 TTE May 2024 MG 32, AVA 0.91, V-max 3.5 TTE December 2024 Coronary artery disease Mild, cardiac catheterization 2022 Hypertension Hyperlipidemia CKD stage IIIb Osteoarthritis  HISTORY OF PRESENT ILLNESS:  June 2024:  The patient is a 77 y.o. female with the indicated medical history here for recommendations regarding her aortic stenosis.  She was seen by her primary cardiologist Dr. Sharrell Ku in April of this year.  At that appointment she noted increasing exertional chest discomfort, dizziness, and fatigue.  An echocardiogram was performed which demonstrated moderate to severe calcific aortic stenosis with preserved ejection fraction.  The patient tells me that compared to a few years ago she believes she has slowed down somewhat but this is obscured by the fact that she has osteoarthritis and she feels that a lot of this is due to her age.  She is unable to tell me whether she gets all that much more short of breath versus a few years ago.  She does need to take breaks when she mows her lawn and does housework.  She denies any exertional angina.  She denies any frank syncope.  She occasionally gets lightheaded when she goes from sitting to standing but this is very rare.  She fortunately has not required any emergency room visits or hospitalizations recently.  She underwent cardiac catheterization a few years ago through the femoral approach which was complicated by hematoma.  She is very concerned about this.  She tells me that she does need dental work and is planning on getting this addressed in the coming months.  Plan: Despite NYHA class II symptoms the patient elected to defer evaluation; follow-up in 6  months and patient to pursue dental evaluation.  December 2024: TTE today demonstrated a mean aortic valve gradient of around 32 mmHg with a peak velocity of 3.5 m/s with preserved LV function consistent with moderate to severe aortic stenosis with an aortic valve area of 0.91.  The patient is much more symptomatic.  She tires out quite often and endorses NYHA class II symptoms of shortness of breath and fatigue.  She occasionally develops chest tightness.  She occasionally gets lightheaded when she goes from sitting to standing quickly but fortunately she has not had any syncope.  She fortunately does not required any emergency room visits.  She did see her dentist and had her dental issues treated completely.  Past Medical History:  Diagnosis Date   Arthritis    Follicular thyroid carcinoma (HCC)    History of chicken pox    Hypertension    Hypocalcemia    Sarcoidosis    Vitiligo     Past Surgical History:  Procedure Laterality Date   ABDOMINAL HYSTERECTOMY  10/1984   CHOLECYSTECTOMY  07/1992   three weeks after gallstone removal   Gallstone removal from Bile Duct  07/1992   TOTAL THYROIDECTOMY  12/2011   TUBAL LIGATION  10/1980   TUMOR EXCISION  05/1972   left side of thyroid    Family History  Problem Relation Age of Onset   Heart disease Mother    Stroke Mother    Hypertension Mother    Diabetes Mother    Heart disease Father    Diabetes Maternal Grandmother  Social History   Socioeconomic History   Marital status: Single    Spouse name: Not on file   Number of children: 1   Years of education: 14   Highest education level: Not on file  Occupational History   Occupation: Retired  Tobacco Use   Smoking status: Never   Smokeless tobacco: Never  Substance and Sexual Activity   Alcohol use: No   Drug use: Not on file   Sexual activity: Not on file  Other Topics Concern   Not on file  Social History Narrative   Regular exercise-yes   Caffeine Use-no    Social Drivers of Health   Financial Resource Strain: Not on file  Food Insecurity: Not on file  Transportation Needs: Not on file  Physical Activity: Not on file  Stress: Not on file  Social Connections: Not on file  Intimate Partner Violence: Not on file     Prior to Admission medications   Medication Sig Start Date End Date Taking? Authorizing Provider  calcitRIOL (ROCALTROL) 0.25 MCG capsule Take 0.25 mcg by mouth 2 (two) times daily.  01/23/12   [provider]  calcium-vitamin D (OSCAL WITH D) 500-200 MG-UNIT per tablet Take 1 tablet by mouth 4 (four) times daily.    [provider]  diphenhydrAMINE (BENADRYL) 25 MG tablet Take 25 mg by mouth at bedtime.    [provider]  estradiol (ESTRACE) 1 MG tablet Take 1 mg by mouth daily.  01/04/12   [provider]  lisinopril-hydrochlorothiazide (PRINZIDE,ZESTORETIC) 20-12.5 MG per tablet Take 1 tablet by mouth daily.  02/02/12   [provider]  LORazepam (ATIVAN) 0.5 MG tablet Take 0.5 mg by mouth every 8 (eight) hours as needed.  12/14/11   [provider]  Naproxen Sodium 220 MG CAPS Take 2 capsules by mouth at bedtime.    [provider]    Allergies  Allergen Reactions   Amoxicillin Nausea And Vomiting   Codeine    Diltiazem Other (See Comments)    "Bad headache"    REVIEW OF SYSTEMS:  General: no fevers/chills/night sweats Eyes: no blurry vision, diplopia, or amaurosis ENT: no sore throat or hearing loss Resp: no cough, wheezing, or hemoptysis CV: no edema or palpitations GI: no abdominal pain, nausea, vomiting, diarrhea, or constipation GU: no dysuria, frequency, or hematuria Skin: no rash Neuro: no headache, numbness, tingling, or weakness of extremities Musculoskeletal: no joint pain or swelling Heme: no bleeding, DVT, or easy bruising Endo: no polydipsia or polyuria  BP (!) 142/76   Pulse 60   Ht 5\' 7"  (1.702 m)   Wt 198 lb (89.8 kg)   SpO2 96%    BMI 31.01 kg/m   PHYSICAL EXAM: GEN:  AO x 3 in no acute distress HEENT: normal Dentition: Poor Neck: JVP normal. +2 carotid upstrokes without bruits. No thyromegaly. Lungs: equal expansion, clear bilaterally CV: Apex is discrete and nondisplaced, RRR with 3/6 SEM Abd: soft, non-tender, non-distended; no bruit; positive bowel sounds Ext: no edema, ecchymoses, or cyanosis Vascular: 2+ femoral pulses, 2+ radial pulses       Skin: warm and dry without rash Neuro: CN II-XII grossly intact; motor and sensory grossly intact    DATA AND STUDIES:  EKG: Sinus rhythm with rare PVC  2D ECHO: OSH TTE 2024 as detailed above  CARDIAC CATH: 2022 [facility] 1. Moderate aortic valve stenosis.  2. Mild non-obstructive CAD.  3. Normal right heart cath pressures.  4. Preserved cardiac output.  STS RISK CALCULATOR: Pending  NHYA CLASS: 2    ASSESSMENT AND PLAN:   Nonrheumatic aortic valve stenosis - Plan: EKG 12-Lead, CBC w/Diff, Basic metabolic panel, Basic metabolic panel, CBC w/Diff  Dyspnea on exertion - Plan: EKG 12-Lead, CBC w/Diff, Basic metabolic panel, Basic metabolic panel, CBC w/Diff  Hyperlipidemia LDL goal <70 - Plan: EKG 12-Lead, CBC w/Diff, Basic metabolic panel, Basic metabolic panel, CBC w/Diff  Primary hypertension - Plan: EKG 12-Lead, CBC w/Diff, Basic metabolic panel, Basic metabolic panel, CBC w/Diff  Stage 3b chronic kidney disease (HCC) - Plan: EKG 12-Lead, CBC w/Diff, Basic metabolic panel, Basic metabolic panel, CBC w/Diff  Pre-procedure lab exam - Plan: CBC w/Diff, Basic metabolic panel, Basic metabolic panel, CBC w/Diff  The patient has developed NYHA class II symptoms of shortness of breath.  I will refer her for preprocedural coronary angiography and right heart catheterization study.  Additionally I will follow-up her echocardiogram.  I am interested to see whether she would be a candidate for the continuing access program of the PROGRESS trial.  Further  recommendations will be issued once the echocardiogram has been reviewed.   I have personally reviewed the patients imaging data as summarized above.  I have reviewed the natural history of aortic stenosis with the patient and family members who are present today. We have discussed the limitations of medical therapy and the poor prognosis associated with symptomatic aortic stenosis. We have also reviewed potential treatment options, including palliative medical therapy, conventional surgical aortic valve replacement, and transcatheter aortic valve replacement. We discussed treatment options in the context of this patient's specific comorbid medical conditions.   All of the patient's questions were answered today. Will make further recommendations based on the results of studies outlined above.   Total time spent with patient today 55 minutes. This includes reviewing records, evaluating the patient and coordinating care.   Orbie Pyo, MD  05/03/2023 1:20 PM    [facility] [address] [place], [place], [place]  [address] Phone: [phone]; Fax: [phone]

## 2023-05-03 ENCOUNTER — Encounter: Payer: Self-pay | Admitting: Internal Medicine

## 2023-05-03 ENCOUNTER — Ambulatory Visit: Payer: Self-pay | Admitting: Internal Medicine

## 2023-05-03 ENCOUNTER — Ambulatory Visit: Payer: Medicare Other | Admitting: Internal Medicine

## 2023-05-03 ENCOUNTER — Ambulatory Visit: Payer: Medicare Other | Attending: Internal Medicine

## 2023-05-03 VITALS — BP 142/76 | HR 60 | Ht 67.0 in | Wt 198.0 lb

## 2023-05-03 DIAGNOSIS — I083 Combined rheumatic disorders of mitral, aortic and tricuspid valves: Secondary | ICD-10-CM | POA: Diagnosis not present

## 2023-05-03 DIAGNOSIS — I10 Essential (primary) hypertension: Secondary | ICD-10-CM

## 2023-05-03 DIAGNOSIS — I350 Nonrheumatic aortic (valve) stenosis: Secondary | ICD-10-CM | POA: Insufficient documentation

## 2023-05-03 DIAGNOSIS — E785 Hyperlipidemia, unspecified: Secondary | ICD-10-CM | POA: Insufficient documentation

## 2023-05-03 DIAGNOSIS — R0609 Other forms of dyspnea: Secondary | ICD-10-CM | POA: Insufficient documentation

## 2023-05-03 DIAGNOSIS — I5030 Unspecified diastolic (congestive) heart failure: Secondary | ICD-10-CM

## 2023-05-03 DIAGNOSIS — I517 Cardiomegaly: Secondary | ICD-10-CM | POA: Diagnosis not present

## 2023-05-03 DIAGNOSIS — N1832 Chronic kidney disease, stage 3b: Secondary | ICD-10-CM

## 2023-05-03 DIAGNOSIS — Z01812 Encounter for preprocedural laboratory examination: Secondary | ICD-10-CM | POA: Insufficient documentation

## 2023-05-03 LAB — ECHOCARDIOGRAM COMPLETE
AR max vel: 1.16 cm2
AV Area VTI: 0.92 cm2
AV Area mean vel: 0.98 cm2
AV Mean grad: 34 mm[Hg]
AV Peak grad: 48.6 mm[Hg]
Ao pk vel: 3.49 m/s
Area-P 1/2: 2.5 cm2
Est EF: 55
MV VTI: 1.84 cm2
S' Lateral: 3.1 cm

## 2023-05-03 NOTE — Patient Instructions (Signed)
Medication Instructions:   Your physician recommends that you continue on your current medications as directed. Please refer to the Current Medication list given to you today.  *If you need a refill on your cardiac medications before your next appointment, please call your pharmacy*   Lab Work:  TODAY--GO DOWNSTAIRS FIRST FLOOR TO [facility]ine TO HAVE THIS DONE--BMET AND CBC W DIFF  If you have labs (blood work) drawn today and your tests are completely normal, you will receive your results only by: MyChart Message (if you have MyChart) OR A paper copy in the mail If you have any lab test that is abnormal or we need to change your treatment, we will call you to review the results.   Testing/Procedures:        Cardiac/Peripheral Catheterization   You are scheduled for a Cardiac Catheterization on Friday, December 27 with Dr. Alverda Skeans.  1. Please arrive at the [facility] (Main Entrance A) at [facility]: [address] [place], [place] [address] at 10:00 AM (This time is 2 hour(s) before your procedure to ensure your preparation).   Free valet parking service is available. You will check in at ADMITTING. The support person will be asked to wait in the waiting room.  It is OK to have someone drop you off and come back when you are ready to be discharged.        Special note: Every effort is made to have your procedure done on time. Please understand that emergencies sometimes delay scheduled procedures.  2. Diet: Do not eat solid foods after midnight.  You may have clear liquids until 5 AM the day of the procedure.  3. Labs: You will need to have blood drawn on Friday, December 13 at [facility]. [address], [place]  Open: 7:30am - 5pm    Phone: [phone]. You do not need to be fasting.  4. Medication instructions in preparation for your procedure:   Contrast Allergy: No    Stop taking, LISINOPRIL-hydrochlorothiazide  THE MORNING OF YOUR CATH Friday, December 27,    On the morning of your procedure, take Aspirin 81 mg and any morning medicines NOT listed above.  You may use sips of water.  5. Plan to go home the same day, you will only stay overnight if medically necessary. 6. You MUST have a responsible adult to drive you home. 7. An adult MUST be with you the first 24 hours after you arrive home. 8. Bring a current list of your medications, and the last time and date medication taken. 9. Bring ID and current insurance cards. 10.Please wear clothes that are easy to get on and off and wear slip-on shoes.  Thank you for allowing [place] to care for you!   -- [facility]    Follow-Up:  WILL BE ARRANGED AFTER YOUR CARDIAC CATH

## 2023-05-03 NOTE — Progress Notes (Addendum)
Pre Surgical Assessment: 5 M Walk Test  14M=16.80ft  5 Meter Walk Test- trial 1: 04.15 seconds 5 Meter Walk Test- trial 2: 05.12 seconds 5 Meter Walk Test- trial 3: 05.04 seconds 5 Meter Walk Test Average: 4.77 seconds   _____________________________  Procedure Type: Isolated AVR Perioperative Outcome Estimate % Operative Mortality 2.13% Morbidity & Mortality 6.68% Stroke 0.93% Renal Failure 1.03% Reoperation 2.64% Prolonged Ventilation 3.58% Deep Sternal Wound Infection 0.051% Long Hospital Stay (>14 days) 3.16% Short Hospital Stay (<6 days)* 46.3%  ________________________________  CCS angina score: 1 ________________________________  NYHA II ________________________________  Leanord Asal: 3.28%

## 2023-05-04 LAB — CBC WITH DIFFERENTIAL/PLATELET
Basophils Absolute: 0.1 10*3/uL (ref 0.0–0.2)
Basos: 1 %
EOS (ABSOLUTE): 0.1 10*3/uL (ref 0.0–0.4)
Eos: 2 %
Hematocrit: 41.6 % (ref 34.0–46.6)
Hemoglobin: 14 g/dL (ref 11.1–15.9)
Immature Grans (Abs): 0 10*3/uL (ref 0.0–0.1)
Immature Granulocytes: 0 %
Lymphocytes Absolute: 1.7 10*3/uL (ref 0.7–3.1)
Lymphs: 29 %
MCH: 32.3 pg (ref 26.6–33.0)
MCHC: 33.7 g/dL (ref 31.5–35.7)
MCV: 96 fL (ref 79–97)
Monocytes Absolute: 0.6 10*3/uL (ref 0.1–0.9)
Monocytes: 10 %
Neutrophils Absolute: 3.4 10*3/uL (ref 1.4–7.0)
Neutrophils: 58 %
Platelets: 219 10*3/uL (ref 150–450)
RBC: 4.33 x10E6/uL (ref 3.77–5.28)
RDW: 12.9 % (ref 11.7–15.4)
WBC: 6 10*3/uL (ref 3.4–10.8)

## 2023-05-04 LAB — BASIC METABOLIC PANEL
BUN/Creatinine Ratio: 17 (ref 12–28)
BUN: 20 mg/dL (ref 8–27)
CO2: 24 mmol/L (ref 20–29)
Calcium: 10.1 mg/dL (ref 8.7–10.3)
Chloride: 101 mmol/L (ref 96–106)
Creatinine, Ser: 1.16 mg/dL — ABNORMAL HIGH (ref 0.57–1.00)
Glucose: 85 mg/dL (ref 70–99)
Potassium: 4.7 mmol/L (ref 3.5–5.2)
Sodium: 141 mmol/L (ref 134–144)
eGFR: 49 mL/min/{1.73_m2} — ABNORMAL LOW (ref >59)

## 2023-05-08 VITALS — BP 154/74 | HR 63 | Resp 14

## 2023-05-08 DIAGNOSIS — Z006 Encounter for examination for normal comparison and control in clinical research program: Secondary | ICD-10-CM

## 2023-05-08 NOTE — Research (Addendum)
Progress CAP Informed Consent      Subject Name: Samantha Wood  Subject met inclusion and exclusion criteria.  The informed consent form, study requirements and expectations were reviewed with the subject and questions and concerns were addressed prior to the signing of the consent form. The shared decision making tool was reviewed with the subject. The subject verbalized understanding of the trial requirements.  The subject agreed to participate in the Progress CAP trial and signed the informed consent at on 05/08/2023.  The informed consent was obtained prior to performance of any protocol-specific procedures for the subject.  A copy of the signed informed consent was given to the subject and a copy was placed in the subject's medical record.   Cobe Viney    INCLUSION:  [x]  77 years of age or older at time of randomization  [x]   Moderate AS per one of the following as assessed by the Echo Core Lab:    A: Aortic valve area (AVA) / Aortic valve area index (AVAi):   AVA 1.0 - 1.5 cm2; OR   AVA < 1.0 with AVAi > 0.6 cm2/m2 if BMI < 30 kg/m2; OR   AVA < 1.0 with AVAi > 0.5 cm2/m2 if BMI >= 30 kg/m2, OR   AVA > 1.5 with AVAi < 0.9 cm2/m2 if BMI < 30 kg/m2; OR   AVA > 1.5 with AVAi < 0.8 cm2/m2 if BMI >= 30 kg/m2   AND  Peak velocity 3.0 - < 4.0 m/s OR mean gradient 20 - < 40 mmHg  OR  B: Subjects who only meet one of the criteria in 2A on resting echo due to low flow state (LVEF < 50% or SVI < 35 mL/m2) are eligible if both criteria are met following dobutamine stress echo (DSE). Note: Subjects with inconclusive results from DSE are eligible upon approval by the Case Review Board.  OR  C: Subjects who only meet one of the criteria in 2A on resting echo and have normal flow (LVEF >= 50% and SVi >= 35 mL/m2) are eligible if non-contrast CT calcium score is < 1200 AU for women or < 2000 AU for men.  [x]   Subject meets at least one of the following criteria:   Valve-related symptoms  including New York Heart Association functional class >= II, dyspnea, angina, dizziness, pre-syncope, or syncope deemed to be related to AS   LVEF < 60% as assessed by the Echo Core Lab   Diastolic dysfunction (>= Grade 2) as assessed by the Echo Core Lab per American Society of Echocardiography Guidelines1 (i.e., E/e' > 14, left atrium volume index > 34 mL/m2, or tricuspid regurgitation velocity > 2.8 m/sec)   Stroke volume index < 35 mL/m2 as assessed by the Echo Core Lab  Persistent atrial fibrillation or any episode of paroxysmal atrial fibrillation within 6 months prior to randomization   NT-Pro BNP > 3x normal   Elevated calcium score as assessed by Computed Tomography (CT) Core Lab (> 1200 AU for female and > 2000 AU for female) (only applicable for subjects eligible per 2A or 2B above)  [x]  The subject or subject's legal representative has been informed of the nature of the study, agrees to its provisions, and has provided written informed consent   EXCLUSION []  Native aortic annulus size unsuitable for the THV based on computed tomography angiography (CTA) analysis  []  Anatomical characteristics that would preclude safe placement of the introducer sheath or safe passage of the delivery system  []   Aortic valve is unicuspid or non-calcified  []  Bicuspid aortic valve with an aneurysmal ascending aorta > 4.5 cm or severe raphe/leaflet calcification as assessed by CT Core Lab  []  Pre-existing mechanical or bioprosthetic aortic valve  []  Severe aortic regurgitation (>3+)  []  Prior balloon aortic valvuloplasty (BAV) to treat severe AS  []  LVEF < 20% as assessed by the Echo Core Lab  []  Left ventricular outflow tract (LVOT) calcification that would increase the risk of annular rupture or significant paravalvular leak (PVL) post-TAVR  []  Cardiac imaging evidence of intracardiac mass, thrombus, or vegetation  []  Coronary or aortic valve anatomy that increases the risk of coronary artery obstruction  post-TAVR  []  Myocardial infarction within 30 days prior to randomization  []  Hypertrophic cardiomyopathy with sub-valvular obstruction or restrictive cardiomyopathy  []  Any concomitant valvular disease requiring surgical or transcatheter intervention.  []  Significant untreated coronary artery disease requiring revascularization  []  Any surgical or transcatheter procedure within 30 days prior to randomization  []  Active bacterial endocarditis within 180 days prior to randomization  []  Stroke or transient ischemic attack (TIA) within 90 days prior to randomization  []  Symptomatic carotid or vertebral artery disease or successful treatment of carotid stenosis within 30 days prior to randomization  []  Severe chronic obstructive pulmonary disease (COPD, Forced Ejection Volume 1 [FEV1] < 50% predicted) or currently on home oxygen  []  Hemodynamic or respiratory instability requiring inotropic or mechanical support within 30 days prior to randomization  []  Liver disease (cirrhosis of the liver [Child-Pugh class B or C])  []  Renal insufficiency (estimated Glomerular Filtration Rate [eGFR] < 30 mL/min/1.73 m2) and/or renal replacement therapy  []  Significant frailty as determined by the Heart Team  []  Leukopenia (White blood cells < 3000 cells/mL), anemia (Hemoglobin < 9 g/dL), thrombocytopenia (platelets < 50,000 cells/mL)  []  Inability to tolerate or condition precluding treatment with antithrombotic therapy (including single antiplatelet therapy)  []  Hypercoagulable state or other condition that increases risk of thrombosis  []  Absolute contraindications or allergy to iodinated contrast that cannot be adequately treated with pre-medication  []  Subject refuses blood products  []  Body mass index (BMI) > 50 kg/m2  []  Estimated life expectancy < 24 months  []  Active SARS-CoV-2 infection (Coronavirus-19 [COVID-19]) or previously diagnosed with COVID-19 with sequelae that could confound endpoint assessments  (as assessed by the Case Review Board)  []  Participating in another drug or device study that has not reached its primary endpoint  []  Subject considered to be part of a vulnerable population    Progress CAP Screening Visit   Medical History: FOCUSED CARDIOVASCULAR PROBLEM LIST:   Aortic stenosis; EKG without bundle-branch blocks MG 24, DVI 0.26, AVA 0.8, V-max 3.3 TTE May 2024 MG 32, AVA 0.91, V-max 3.5 TTE December 2024 Coronary artery disease Mild, cardiac catheterization 2022 Hypertension Hyperlipidemia CKD stage IIIb Osteoarthritis   Medications:  Current Outpatient Medications:    Acetaminophen (TYLENOL ARTHRITIS PAIN PO), Take 650 mg by mouth 2 (two) times daily., Disp: , Rfl:    aspirin EC 81 MG tablet, Take 81 mg by mouth daily. Swallow whole., Disp: , Rfl:    calcitRIOL (ROCALTROL) 0.25 MCG capsule, Take 0.25 mcg by mouth 2 (two) times daily. , Disp: , Rfl:    calcium-vitamin D (OSCAL WITH D) 500-200 MG-UNIT per tablet, Take 2 tablets by mouth 2 (two) times daily., Disp: , Rfl:    Cranberry-Vitamin C-Probiotic (AZO CRANBERRY PO), Take by mouth 2 (two) times daily., Disp: , Rfl:  diphenhydrAMINE (BENADRYL) 25 MG tablet, Take 25 mg by mouth at bedtime., Disp: , Rfl:    estradiol (ESTRACE) 1 MG tablet, Take 1 mg by mouth daily. , Disp: , Rfl:    levothyroxine (EUTHYROX) 112 MCG tablet, Take 112 mcg by mouth daily before breakfast., Disp: , Rfl:    lisinopril-hydrochlorothiazide (PRINZIDE,ZESTORETIC) 20-12.5 MG per tablet, Take 1 tablet by mouth daily. , Disp: , Rfl:    LORazepam (ATIVAN) 0.5 MG tablet, Take 0.5 mg by mouth every 8 (eight) hours as needed. , Disp: , Rfl:     Society of Thoracic Surgeons Score: Procedure Type: Isolated AVR Perioperative OutcomeEstimate % Operative Mortality2.13% Morbidity & Mortality6.68% Stroke0.93% Renal Failure1.03% Reoperation2.64% Prolonged Ventilation3.58% Deep Sternal Wound Infection0.051% Long Hospital Stay (>14  days)3.16% [facility] Stay (<6 days)*46.3%  European System for Cardiac Operative Risk Evaluation (EuroSCORE) ll: 3.28%   Surgical Risk assessed by Heart Team: Pending  [x]  - Low []  - Intermediate []  - High []  - Extreme   New York Heart Association  (NYHA) ll  Canadian Cardiovascular Society Score (CCS) angina grade: 1  Transthoracic echocardiogram (TTE), including DSE as applicable: 05/03/2023   12- lead electrocardiogram:    Cardiac CTA with 3D within 1 year:   PFT: N/A  NIHSS MRS   Labs: See labs  KCCQ: See worksheet   [id number]: See worksheet   SF-36:  See worksheet

## 2023-05-09 LAB — COMPREHENSIVE METABOLIC PANEL
ALT: 12 [IU]/L (ref 0–32)
AST: 16 [IU]/L (ref 0–40)
Albumin: 4.5 g/dL (ref 3.8–4.8)
Alkaline Phosphatase: 59 [IU]/L (ref 44–121)
BUN/Creatinine Ratio: 18 (ref 12–28)
BUN: 19 mg/dL (ref 8–27)
Bilirubin Total: 0.9 mg/dL (ref 0.0–1.2)
CO2: 25 mmol/L (ref 20–29)
Calcium: 10.2 mg/dL (ref 8.7–10.3)
Chloride: 102 mmol/L (ref 96–106)
Creatinine, Ser: 1.05 mg/dL — ABNORMAL HIGH (ref 0.57–1.00)
Globulin, Total: 2 g/dL (ref 1.5–4.5)
Glucose: 85 mg/dL (ref 70–99)
Potassium: 4.5 mmol/L (ref 3.5–5.2)
Sodium: 144 mmol/L (ref 134–144)
Total Protein: 6.5 g/dL (ref 6.0–8.5)
eGFR: 55 mL/min/{1.73_m2} — ABNORMAL LOW (ref >59)

## 2023-05-09 LAB — PRO B NATRIURETIC PEPTIDE: NT-Pro BNP: 691 pg/mL (ref 0–738)

## 2023-05-09 NOTE — Progress Notes (Signed)
Progress CAP labs for review

## 2023-05-13 ENCOUNTER — Telehealth: Payer: Self-pay | Admitting: *Deleted

## 2023-05-13 NOTE — Telephone Encounter (Signed)
Cardiac Catheterization scheduled at [facility] for: Friday May 17, 2023 12 Noon Arrival time [facility] Main Entrance A at: 10 AM  Nothing to eat after midnight prior to procedure, clear liquids until 5 AM day of procedure.  Medication instructions: -Hold:  Lisinopril/HCT-day before and day of procedure -per protocol GFR <60 (55) -Other usual morning medications can be taken with sips of water including aspirin 81 mg.  Plan to go home the same day, you will only stay overnight if medically necessary.  You must have responsible adult to drive you home.  Someone must be with you the first 24 hours after you arrive home.  Reviewed procedure instructions with patient.

## 2023-05-17 ENCOUNTER — Encounter (HOSPITAL_COMMUNITY)
Admission: RE | Disposition: A | Discharge status: Home / Self Care | Payer: Self-pay | Source: Home / Self Care | Attending: Internal Medicine

## 2023-05-17 ENCOUNTER — Other Ambulatory Visit (HOSPITAL_COMMUNITY): Payer: Self-pay

## 2023-05-17 ENCOUNTER — Ambulatory Visit (HOSPITAL_COMMUNITY)
Admission: RE | Admit: 2023-05-17 | Discharge: 2023-05-17 | Disposition: A | Discharge status: Home / Self Care | Payer: Medicare Other | Attending: Internal Medicine | Admitting: Internal Medicine

## 2023-05-17 ENCOUNTER — Other Ambulatory Visit: Payer: Self-pay

## 2023-05-17 ENCOUNTER — Telehealth: Payer: Self-pay | Admitting: Cardiology

## 2023-05-17 DIAGNOSIS — N183 Chronic kidney disease, stage 3 unspecified: Secondary | ICD-10-CM | POA: Insufficient documentation

## 2023-05-17 DIAGNOSIS — I25119 Atherosclerotic heart disease of native coronary artery with unspecified angina pectoris: Secondary | ICD-10-CM | POA: Diagnosis not present

## 2023-05-17 DIAGNOSIS — N1832 Chronic kidney disease, stage 3b: Secondary | ICD-10-CM | POA: Insufficient documentation

## 2023-05-17 DIAGNOSIS — Z7902 Long term (current) use of antithrombotics/antiplatelets: Secondary | ICD-10-CM | POA: Diagnosis not present

## 2023-05-17 DIAGNOSIS — I350 Nonrheumatic aortic (valve) stenosis: Secondary | ICD-10-CM | POA: Diagnosis not present

## 2023-05-17 DIAGNOSIS — Z955 Presence of coronary angioplasty implant and graft: Secondary | ICD-10-CM | POA: Diagnosis not present

## 2023-05-17 DIAGNOSIS — Z79899 Other long term (current) drug therapy: Secondary | ICD-10-CM | POA: Diagnosis not present

## 2023-05-17 DIAGNOSIS — I129 Hypertensive chronic kidney disease with stage 1 through stage 4 chronic kidney disease, or unspecified chronic kidney disease: Secondary | ICD-10-CM | POA: Diagnosis not present

## 2023-05-17 DIAGNOSIS — E785 Hyperlipidemia, unspecified: Secondary | ICD-10-CM | POA: Diagnosis not present

## 2023-05-17 DIAGNOSIS — R0609 Other forms of dyspnea: Secondary | ICD-10-CM | POA: Insufficient documentation

## 2023-05-17 DIAGNOSIS — I10 Essential (primary) hypertension: Secondary | ICD-10-CM | POA: Diagnosis present

## 2023-05-17 DIAGNOSIS — I2510 Atherosclerotic heart disease of native coronary artery without angina pectoris: Secondary | ICD-10-CM

## 2023-05-17 HISTORY — DX: Atherosclerotic heart disease of native coronary artery without angina pectoris: I25.10

## 2023-05-17 HISTORY — PX: RIGHT HEART CATH AND CORONARY ANGIOGRAPHY: CATH118264

## 2023-05-17 HISTORY — PX: CORONARY STENT INTERVENTION: CATH118234

## 2023-05-17 LAB — POCT I-STAT 7, (LYTES, BLD GAS, ICA,H+H)
Acid-Base Excess: 0 mmol/L (ref 0.0–2.0)
Bicarbonate: 25.6 mmol/L (ref 20.0–28.0)
Calcium, Ion: 1.13 mmol/L — ABNORMAL LOW (ref 1.15–1.40)
HCT: 35 % — ABNORMAL LOW (ref 36.0–46.0)
Hemoglobin: 11.9 g/dL — ABNORMAL LOW (ref 12.0–15.0)
O2 Saturation: 89 %
Potassium: 4.1 mmol/L (ref 3.5–5.1)
Sodium: 140 mmol/L (ref 135–145)
TCO2: 27 mmol/L (ref 22–32)
pCO2 arterial: 43.3 mm[Hg] (ref 32–48)
pH, Arterial: 7.38 (ref 7.35–7.45)
pO2, Arterial: 58 mm[Hg] — ABNORMAL LOW (ref 83–108)

## 2023-05-17 LAB — POCT I-STAT EG7
Acid-base deficit: 1 mmol/L (ref 0.0–2.0)
Acid-base deficit: 1 mmol/L (ref 0.0–2.0)
Bicarbonate: 25 mmol/L (ref 20.0–28.0)
Bicarbonate: 25 mmol/L (ref 20.0–28.0)
Calcium, Ion: 1.01 mmol/L — ABNORMAL LOW (ref 1.15–1.40)
Calcium, Ion: 1.02 mmol/L — ABNORMAL LOW (ref 1.15–1.40)
HCT: 34 % — ABNORMAL LOW (ref 36.0–46.0)
HCT: 34 % — ABNORMAL LOW (ref 36.0–46.0)
Hemoglobin: 11.6 g/dL — ABNORMAL LOW (ref 12.0–15.0)
Hemoglobin: 11.6 g/dL — ABNORMAL LOW (ref 12.0–15.0)
O2 Saturation: 69 %
O2 Saturation: 75 %
Potassium: 3.7 mmol/L (ref 3.5–5.1)
Potassium: 3.7 mmol/L (ref 3.5–5.1)
Sodium: 140 mmol/L (ref 135–145)
Sodium: 140 mmol/L (ref 135–145)
TCO2: 26 mmol/L (ref 22–32)
TCO2: 26 mmol/L (ref 22–32)
pCO2, Ven: 44 mm[Hg] (ref 44–60)
pCO2, Ven: 44.1 mm[Hg] (ref 44–60)
pH, Ven: 7.363 (ref 7.25–7.43)
pH, Ven: 7.362 (ref 7.25–7.43)
pO2, Ven: 38 mm[Hg] (ref 32–45)
pO2, Ven: 42 mm[Hg] (ref 32–45)

## 2023-05-17 LAB — POCT ACTIVATED CLOTTING TIME: Activated Clotting Time: 314 s

## 2023-05-17 SURGERY — CORONARY STENT INTERVENTION
Anesthesia: LOCAL

## 2023-05-17 MED ORDER — CLOPIDOGREL BISULFATE 300 MG PO TABS
ORAL_TABLET | ORAL | Status: DC | PRN
  Administered 2023-05-17: 600 mg via ORAL

## 2023-05-17 MED ORDER — NITROGLYCERIN 1 MG/10 ML FOR IR/CATH LAB
INTRA_ARTERIAL | Status: AC
  Filled 2023-05-17: qty 10

## 2023-05-17 MED ORDER — HEPARIN SODIUM (PORCINE) 1000 UNIT/ML IJ SOLN
INTRAMUSCULAR | Status: DC | PRN
  Administered 2023-05-17: 4000 [IU] via INTRAVENOUS
  Administered 2023-05-17: 5000 [IU] via INTRAVENOUS

## 2023-05-17 MED ORDER — ACETAMINOPHEN 325 MG PO TABS: 650.0000 mg | ORAL_TABLET | ORAL | Status: DC | PRN

## 2023-05-17 MED ORDER — FUROSEMIDE 20 MG PO TABS
20.0000 mg | ORAL_TABLET | Freq: Two times a day (BID) | ORAL | 11 refills | Status: DC
  Filled 2023-05-17: qty 60, 30d supply, fill #0

## 2023-05-17 MED ORDER — ASPIRIN 81 MG PO TBEC
81.0000 mg | DELAYED_RELEASE_TABLET | Freq: Every day | ORAL | 1 refills | Status: AC
  Filled 2023-05-17: qty 90, 90d supply, fill #0

## 2023-05-17 MED ORDER — SODIUM CHLORIDE 0.9% FLUSH: 3.0000 mL | Freq: Two times a day (BID) | INTRAVENOUS | Status: DC

## 2023-05-17 MED ORDER — LABETALOL HCL 5 MG/ML IV SOLN: 10.0000 mg | INTRAVENOUS | Status: DC | PRN

## 2023-05-17 MED ORDER — LIDOCAINE HCL (PF) 1 % IJ SOLN
INTRAMUSCULAR | Status: AC
Start: 2023-05-17 — End: ?
  Filled 2023-05-17: qty 30

## 2023-05-17 MED ORDER — MIDAZOLAM HCL 2 MG/2ML IJ SOLN
INTRAMUSCULAR | Status: AC
  Filled 2023-05-17: qty 2

## 2023-05-17 MED ORDER — VERAPAMIL HCL 2.5 MG/ML IV SOLN
INTRAVENOUS | Status: AC
  Filled 2023-05-17: qty 2

## 2023-05-17 MED ORDER — MIDAZOLAM HCL 2 MG/2ML IJ SOLN
INTRAMUSCULAR | Status: DC | PRN
  Administered 2023-05-17 (×2): 1 mg via INTRAVENOUS

## 2023-05-17 MED ORDER — ONDANSETRON HCL 4 MG/2ML IJ SOLN: 4.0000 mg | Freq: Four times a day (QID) | INTRAMUSCULAR | Status: DC | PRN

## 2023-05-17 MED ORDER — SODIUM CHLORIDE 0.9 % IV SOLN
250.0000 mL | INTRAVENOUS | Status: DC | PRN
Start: 2023-05-17 — End: 2023-05-17

## 2023-05-17 MED ORDER — CLOPIDOGREL BISULFATE 75 MG PO TABS
75.0000 mg | ORAL_TABLET | Freq: Every day | ORAL | 6 refills | Status: AC
  Filled 2023-05-17: qty 90, 90d supply, fill #0

## 2023-05-17 MED ORDER — ASPIRIN 81 MG PO CHEW: 81.0000 mg | CHEWABLE_TABLET | ORAL | Status: DC

## 2023-05-17 MED ORDER — ASPIRIN 81 MG PO TBEC: 81.0000 mg | DELAYED_RELEASE_TABLET | Freq: Every day | ORAL | 0 refills | Status: DC

## 2023-05-17 MED ORDER — SODIUM CHLORIDE 0.9 % WEIGHT BASED INFUSION: 1.0000 mL/kg/h | INTRAVENOUS | Status: DC

## 2023-05-17 MED ORDER — CLOPIDOGREL BISULFATE 75 MG PO TABS: 75.0000 mg | ORAL_TABLET | Freq: Every day | ORAL | 6 refills | Status: DC

## 2023-05-17 MED ORDER — HEPARIN (PORCINE) IN NACL 1000-0.9 UT/500ML-% IV SOLN
INTRAVENOUS | Status: DC | PRN
  Administered 2023-05-17 (×2): 500 mL

## 2023-05-17 MED ORDER — CLOPIDOGREL BISULFATE 300 MG PO TABS
ORAL_TABLET | ORAL | Status: AC
Start: 2023-05-17 — End: ?
  Filled 2023-05-17: qty 2

## 2023-05-17 MED ORDER — SODIUM CHLORIDE 0.9 % WEIGHT BASED INFUSION
3.0000 mL/kg/h | INTRAVENOUS | Status: DC
  Administered 2023-05-17: 3 mL/kg/h via INTRAVENOUS

## 2023-05-17 MED ORDER — ASPIRIN 81 MG PO CHEW: 81.0000 mg | CHEWABLE_TABLET | Freq: Every day | ORAL | Status: DC

## 2023-05-17 MED ORDER — FENTANYL CITRATE (PF) 100 MCG/2ML IJ SOLN
INTRAMUSCULAR | Status: DC | PRN
  Administered 2023-05-17 (×2): 25 ug via INTRAVENOUS

## 2023-05-17 MED ORDER — HYDRALAZINE HCL 20 MG/ML IJ SOLN: 10.0000 mg | INTRAMUSCULAR | Status: DC | PRN

## 2023-05-17 MED ORDER — HEPARIN SODIUM (PORCINE) 1000 UNIT/ML IJ SOLN
INTRAMUSCULAR | Status: AC
  Filled 2023-05-17: qty 10

## 2023-05-17 MED ORDER — LIDOCAINE HCL (PF) 1 % IJ SOLN
INTRAMUSCULAR | Status: DC | PRN
  Administered 2023-05-17: 5 mL

## 2023-05-17 MED ORDER — SODIUM CHLORIDE 0.9 % IV SOLN: INTRAVENOUS | Status: DC

## 2023-05-17 MED ORDER — IOHEXOL 350 MG/ML SOLN
INTRAVENOUS | Status: DC | PRN
  Administered 2023-05-17: 110 mL

## 2023-05-17 MED ORDER — SODIUM CHLORIDE 0.9% FLUSH
3.0000 mL | INTRAVENOUS | Status: DC | PRN
Start: 2023-05-17 — End: 2023-05-17

## 2023-05-17 MED ORDER — VERAPAMIL HCL 2.5 MG/ML IV SOLN
INTRAVENOUS | Status: DC | PRN
  Administered 2023-05-17: 10 mL via INTRA_ARTERIAL

## 2023-05-17 MED ORDER — FENTANYL CITRATE (PF) 100 MCG/2ML IJ SOLN
INTRAMUSCULAR | Status: AC
  Filled 2023-05-17: qty 2

## 2023-05-17 MED ORDER — NITROGLYCERIN 0.4 MG SL SUBL
0.4000 mg | SUBLINGUAL_TABLET | SUBLINGUAL | 2 refills | Status: AC | PRN
  Filled 2023-05-17: qty 25, 5d supply, fill #0

## 2023-05-17 MED ORDER — NITROGLYCERIN 1 MG/10 ML FOR IR/CATH LAB
INTRA_ARTERIAL | Status: DC | PRN
  Administered 2023-05-17: 150 ug via INTRACORONARY

## 2023-05-17 MED ORDER — CLOPIDOGREL BISULFATE 75 MG PO TABS
75.0000 mg | ORAL_TABLET | Freq: Every day | ORAL | Status: DC
Start: 2023-05-18 — End: 2023-05-17

## 2023-05-17 SURGICAL SUPPLY — 22 items
BALLN EMERGE MR 2.0X12 (BALLOONS) ×1 IMPLANT
BALLN SAPPHIRE 2.5X12 (BALLOONS) ×1 IMPLANT
BALLN ~~LOC~~ EMERGE MR 2.75X8 (BALLOONS) ×1 IMPLANT
BALLOON EMERGE MR 2.0X12 (BALLOONS) IMPLANT
BALLOON SAPPHIRE 2.5X12 (BALLOONS) IMPLANT
BALLOON ~~LOC~~ EMERGE MR 2.75X8 (BALLOONS) IMPLANT
CATH BALLN WEDGE 5F 110CM (CATHETERS) IMPLANT
CATH INFINITI 5FR ANG PIGTAIL (CATHETERS) IMPLANT
CATH INFINITI AMBI 6FR TG (CATHETERS) IMPLANT
CATH LAUNCHER 6FR JR4 (CATHETERS) IMPLANT
CATHETER LAUNCHER 6FR JR4 SH (CATHETERS) IMPLANT
DEVICE RAD COMP TR BAND LRG (VASCULAR PRODUCTS) IMPLANT
GLIDESHEATH SLEND SS 6F .021 (SHEATH) IMPLANT
GUIDEWIRE VAS SION BLUE 190 (WIRE) IMPLANT
KIT ENCORE 26 ADVANTAGE (KITS) IMPLANT
KIT HEMO VALVE WATCHDOG (MISCELLANEOUS) IMPLANT
PACK CARDIAC CATHETERIZATION (CUSTOM PROCEDURE TRAY) ×1 IMPLANT
SET ATX-X65L (MISCELLANEOUS) IMPLANT
SHEATH GLIDE SLENDER 4/5FR (SHEATH) IMPLANT
STENT SYNERGY XD 2.50X16 (Permanent Stent) IMPLANT
SYNERGY XD 2.50X16 (Permanent Stent) ×1 IMPLANT
WIRE EMERALD 3MM-J .035X260CM (WIRE) IMPLANT

## 2023-05-17 NOTE — Progress Notes (Signed)
Discussed with pt and husband stent, Plavix, restrictions, walking, and CRPII. Pt receptive, will refer to [facility]. Gave HH diet and info on NTG if prescribed.  [id number] Ethelda Chick BS, ACSM-CEP 05/17/2023 3:00 PM

## 2023-05-17 NOTE — Discharge Summary (Addendum)
Discharge Summary for Same Day PCI   Patient ID: Samantha Wood MRN: [id number]; DOB: 09/21/1945  Admit date: 05/17/2023 Discharge date: 05/17/2023  Primary Care Provider: Richardean Chimera, MD  Primary Cardiologist: Orbie Pyo, MD Assar, Rayetta Pigg, MD  Primary Electrophysiologist:  None   Discharge Diagnoses    Principal Problem:   CAD (coronary artery disease) Active Problems:   Hypertension   Aortic stenosis   HLD (hyperlipidemia)   CKD (chronic kidney disease) stage 3, GFR 30-59 ml/min [facility])  Diagnostic Studies/Procedures    Cardiac Catheterization 05/17/2023:  Mid RCA lesion is 90% stenosed.   A stent was successfully placed.   Post intervention, there is a 0% residual stenosis.   1.  High-grade mid right coronary artery lesion treated with 1 drug-eluting stent. 2.  Fick cardiac output of 10.1 L/min and Fick cardiac index of 5.0 L/min/m with the following hemodynamics:            Right atrial pressure mean of 18 mmHg.            RV 48/10 with an end-diastolic pressure of 23 mmHg.            Wedge pressure mean of 27 mmHg with V waves to 34 mmHg.            PA pressure 50/25 with a mean of 34 mmHg            PVR of 0.7 Woods units            PA pulsatility index of 1.4   Recommendation: Dual antiplatelet therapy for 6 months followed by indefinite Plavix monotherapy; start Lasix 20 mg twice daily due to elevated filling pressures and check BMP in 1 week.  Will determine whether patient remains a candidate for PROGRESS CAP research program. _____________   History of Present Illness     Samantha Wood is a 77 y.o. female with aortic stenosis, mild nonobstructive CAD on previous catheterization 2022, hypertension, hyperlipidemia, CKD.  Patient has been reporting increased exertional chest discomfort, dizziness and fatigue.  Previous echocardiogram demonstrated moderate to severe calcific aortic stenosis with preserved EF.  She does reports worsening shortness of breath  for the past few years now has to take breaks when she mows her lawn and does housework.  Denies any exertional angina or syncope.  She had a cardiac catheterization in 2022 that demonstrated moderate aortic valve stenosis with mild nonobstructive CAD and normal right heart pressures with preserved cardiac output.  Right and left cardiac catheterization was arranged for further evaluation.  Hospital Course     The patient underwent cardiac cath as noted above with mid RCA lesion, 90% stenosis. Plan for DAPT with ASA/Plavix for at least 6 months and then Plavix monotherapy indefinitely. The patient was seen by cardiac rehab while in short stay. There were no observed complications post cath. Radial right cath site was re-evaluated prior to discharge and found to be stable without any complications. Instructions/precautions regarding cath site care were given prior to discharge.  Samantha Wood was seen by Dr. Lynnette Caffey and determined stable for discharge home. Follow up with our office has been arranged. Medications are listed below.   Plan: She will be discharged on p.o. Lasix 20 mg twice daily for elevated filling pressures, evaluate outpatient for ongoing need for diuretics. Continue DAPT for 6 months then Plavix monotherapy as above Deferring statin for this time.  She has severe osteoarthritis and has been does not want  to be on a statin at this time due to concerns of myalgias.  We agreed upon weight loss and exercise and nutrition as a compromise.  Check LDL at follow-up visit.  She understands risks and benefits. Patient to be discharged approximately 1800 after cath site has been evaluated.  Signout has been given.    _____________  Cath/PCI Registry Performance & Quality Measures: Aspirin prescribed? - Yes ADP Receptor Inhibitor (Plavix/Clopidogrel, Brilinta/Ticagrelor or Effient/Prasugrel) prescribed (includes medically managed patients)? - Yes High Intensity Statin (Lipitor 40-80mg  or  Crestor 20-40mg ) prescribed? - No - see above For EF <40%, was ACEI/ARB prescribed? - Not Applicable (EF >/= 40%) For EF <40%, Aldosterone Antagonist (Spironolactone or Eplerenone) prescribed? - Not Applicable (EF >/= 40%) Cardiac Rehab Phase II ordered (Included Medically managed Patients)? - Yes  _____________   Discharge Vitals Blood pressure 116/66, pulse 61, temperature (!) 97.5 F (36.4 C), temperature source Temporal, resp. rate 12, height 5\' 7"  (1.702 m), weight 89.8 kg, SpO2 97%.  Filed Weights   05/17/23 1003  Weight: 89.8 kg    Last Labs & Radiologic Studies    CBC Recent Labs    05/17/23 1315 05/17/23 1316  HGB 11.6* 11.6*  HCT 34.0* 34.0*   Basic Metabolic Panel Recent Labs    09/81/19 1315 05/17/23 1316  NA 140 140  K 3.7 3.7   Liver Function Tests No results for input(s): "AST", "ALT", "ALKPHOS", "BILITOT", "PROT", "ALBUMIN" in the last 72 hours. No results for input(s): "LIPASE", "AMYLASE" in the last 72 hours. High Sensitivity Troponin:   No results for input(s): "TROPONINIHS" in the last 720 hours.  BNP Invalid input(s): "POCBNP" D-Dimer No results for input(s): "DDIMER" in the last 72 hours. Hemoglobin A1C No results for input(s): "HGBA1C" in the last 72 hours. Fasting Lipid Panel No results for input(s): "CHOL", "HDL", "LDLCALC", "TRIG", "CHOLHDL", "LDLDIRECT" in the last 72 hours. Thyroid Function Tests No results for input(s): "TSH", "T4TOTAL", "T3FREE", "THYROIDAB" in the last 72 hours.  Invalid input(s): "FREET3" _____________  CARDIAC CATHETERIZATION Result Date: 05/17/2023   Mid RCA lesion is 90% stenosed.   A stent was successfully placed.   Post intervention, there is a 0% residual stenosis. 1.  High-grade mid right coronary artery lesion treated with 1 drug-eluting stent. 2.  Fick cardiac output of 10.1 L/min and Fick cardiac index of 5.0 L/min/m with the following hemodynamics:  Right atrial pressure mean of 18 mmHg.  RV 48/10 with  an end-diastolic pressure of 23 mmHg.  Wedge pressure mean of 27 mmHg with V waves to 34 mmHg.  PA pressure 50/25 with a mean of 34 mmHg  PVR of 0.7 Woods units  PA pulsatility index of 1.4 Recommendation: Dual antiplatelet therapy for 6 months followed by indefinite Plavix monotherapy; start Lasix 20 mg twice daily due to elevated filling pressures and check BMP in 1 week.  Will determine whether patient remains a candidate for PROGRESS CAP research program.   ECHOCARDIOGRAM COMPLETE Result Date: 05/03/2023    ECHOCARDIOGRAM REPORT   Patient Name:   Samantha Wood Date of Exam: 05/03/2023 Medical Rec #:  [id number]     Height:       67.0 in Accession #:    [id number]    Weight:       198.0 lb Date of Birth:  1946-05-16     BSA:          2.014 m Patient Age:    77 years      BP:  142/70 mmHg Patient Gender: F             HR:           64 bpm. Exam Location:  [address] Procedure: 2D Echo, 3D Echo, Cardiac Doppler, Color Doppler and Strain Analysis Indications:    Aortic stenosis I35.0  History:        Patient has prior history of Echocardiogram examinations, most                 recent 09/26/2022. Aortic Valve Disease; Risk                 Factors:Hypertension. Sarcoidosis.  Sonographer:    Eulah Pont RDCS Referring Phys: [id number] Orbie Pyo  Sonographer Comments: Global longitudinal strain was attempted. IMPRESSIONS  1. Left ventricular ejection fraction, by estimation, is 55%. The left ventricle has normal function. The left ventricle has no regional Ellers motion abnormalities. Left ventricular diastolic parameters are consistent with Grade I diastolic dysfunction (impaired relaxation). The average left ventricular global longitudinal strain is -14.7 %. The global longitudinal strain is abnormal.  2. Right ventricular systolic function is normal. The right ventricular size is normal. There is normal pulmonary artery systolic pressure. The estimated right ventricular systolic pressure is 24.3  mmHg.  3. Left atrial size was mildly dilated.  4. The mitral valve is normal in structure. Mild mitral valve regurgitation. Mild mitral stenosis. The mean mitral valve gradient is 3.0 mmHg.  5. The aortic valve is tricuspid. There is severe calcifcation of the aortic valve. Aortic valve regurgitation is trivial. Aortic valve mean gradient measures 34.0 mmHg.  6. The inferior vena cava is normal in size with greater than 50% respiratory variability, suggesting right atrial pressure of 3 mmHg. FINDINGS  Left Ventricle: Left ventricular ejection fraction, by estimation, is 55%. The left ventricle has normal function. The left ventricle has no regional Doner motion abnormalities. The average left ventricular global longitudinal strain is -14.7 %. The global longitudinal strain is abnormal. The left ventricular internal cavity size was normal in size. There is no left ventricular hypertrophy. Left ventricular diastolic parameters are consistent with Grade I diastolic dysfunction (impaired relaxation). Right Ventricle: The right ventricular size is normal. No increase in right ventricular Dallman thickness. Right ventricular systolic function is normal. There is normal pulmonary artery systolic pressure. The tricuspid regurgitant velocity is 2.31 m/s, and  with an assumed right atrial pressure of 3 mmHg, the estimated right ventricular systolic pressure is 24.3 mmHg. Left Atrium: Left atrial size was mildly dilated. Right Atrium: Right atrial size was normal in size. Pericardium: There is no evidence of pericardial effusion. Mitral Valve: The mitral valve is normal in structure. There is moderate calcification of the mitral valve leaflet(s). Mild mitral annular calcification. Mild mitral valve regurgitation. Mild mitral valve stenosis. MV peak gradient, 7.4 mmHg. The mean mitral valve gradient is 3.0 mmHg. Tricuspid Valve: The tricuspid valve is normal in structure. Tricuspid valve regurgitation is mild. Aortic Valve: The  aortic valve is tricuspid. There is severe calcifcation of the aortic valve. Aortic valve regurgitation is trivial. Aortic valve mean gradient measures 34.0 mmHg. Aortic valve peak gradient measures 48.6 mmHg. Aortic valve area, by VTI measures 0.92 cm. Pulmonic Valve: The pulmonic valve was normal in structure. Pulmonic valve regurgitation is not visualized. Aorta: The aortic root is normal in size and structure. Venous: The inferior vena cava is normal in size with greater than 50% respiratory variability, suggesting right atrial pressure of 3 mmHg.  IAS/Shunts: No atrial level shunt detected by color flow Doppler.  LEFT VENTRICLE PLAX 2D LVIDd:         4.36 cm   Diastology LVIDs:         3.10 cm   LV e' medial:    6.53 cm/s LV PW:         0.92 cm   LV E/e' medial:  18.8 LV IVS:        1.03 cm   LV e' lateral:   6.00 cm/s LVOT diam:     1.90 cm   LV E/e' lateral: 20.5 LV SV:         90 LV SV Index:   45        2D Longitudinal Strain LVOT Area:     2.84 cm  2D Strain GLS Avg:     -14.7 %                           3D Volume EF:                          3D EF:        51 %                          LV EDV:       149 ml                          LV ESV:       74 ml                          LV SV:        75 ml RIGHT VENTRICLE RV S prime:     19.30 cm/s TAPSE (M-mode): 2.2 cm LEFT ATRIUM             Index        RIGHT ATRIUM          Index LA diam:        4.40 cm 2.19 cm/m   RA Area:     9.84 cm LA Vol (A2C):   62.6 ml 31.09 ml/m  RA Volume:   18.90 ml 9.39 ml/m LA Vol (A4C):   62.6 ml 31.09 ml/m LA Biplane Vol: 62.7 ml 31.14 ml/m  AORTIC VALVE AV Area (Vmax):    1.16 cm AV Area (Vmean):   0.98 cm AV Area (VTI):     0.92 cm AV Vmax:           348.50 cm/s AV Vmean:          275.000 cm/s AV VTI:            0.977 m AV Peak Grad:      48.6 mmHg AV Mean Grad:      34.0 mmHg LVOT Vmax:         142.00 cm/s LVOT Vmean:        95.000 cm/s LVOT VTI:          0.318 m LVOT/AV VTI ratio: 0.33  AORTA Ao Root diam: 2.90 cm Ao  Asc diam:  3.20 cm MITRAL VALVE                TRICUSPID VALVE MV Area (PHT): 2.50 cm     TR Peak grad:  21.3 mmHg MV Area VTI:   1.84 cm     TR Vmax:        231.00 cm/s MV Peak grad:  7.4 mmHg MV Mean grad:  3.0 mmHg     SHUNTS MV Vmax:       1.36 m/s     Systemic VTI:  0.32 m MV Vmean:      83.2 cm/s    Systemic Diam: 1.90 cm MV E velocity: 123.00 cm/s MV A velocity: 128.00 cm/s MV E/A ratio:  0.96 Dalton McleanMD Electronically signed by Wilfred Lacy Signature Date/Time: 05/03/2023/12:19:21 PM    Final     Disposition   Pt is being discharged home today in good condition.  Follow-up Plans & Appointments     Follow-up Information     Beatrice Lecher, PA-C Follow up.   Specialties: Cardiology, Physician Assistant Why: Monday Jun 03, 2023 Arrive by 8:10 AMAppt at 8:25 AM (25 min) Contact information: [address] [place] [address] [phone]                Discharge Instructions     Amb Referral to Cardiac Rehabilitation   Complete by: As directed    Diagnosis:  Coronary Stents PTCA     After initial evaluation and assessments completed: Virtual Based Care may be provided alone or in conjunction with Phase 2 Cardiac Rehab based on patient barriers.: Yes   Intensive Cardiac Rehabilitation (ICR) [facility] location only OR Traditional Cardiac Rehabilitation (TCR) *If criteria for ICR are not met will enroll in TCR [facility] only): Yes        Discharge Medications   Allergies as of 05/17/2023       Reactions   Amoxicillin Nausea And Vomiting   Headache   Codeine Other (See Comments)   "Bad headache"   Diltiazem Other (See Comments)   "Bad headache"        Medication List     TAKE these medications    acetaminophen 650 MG CR tablet Commonly known as: TYLENOL Take 1,300 mg by mouth 2 (two) times daily.   aspirin EC 81 MG tablet Take 1 tablet (81 mg total) by mouth daily. Swallow whole.   AZO CRANBERRY PO Take 1 tablet by mouth 2  (two) times daily.   Blue-Emu Hemp 10 % cream Generic drug: trolamine salicylate Apply 1 Application topically at bedtime.   calcitRIOL 0.25 MCG capsule Commonly known as: ROCALTROL Take 0.25 mcg by mouth 2 (two) times daily.   CALCIUM-MAGNESIUM-ZINC-D3 PO Take 2 tablets by mouth 2 (two) times daily.   clopidogrel 75 MG tablet Commonly known as: Plavix Take 1 tablet (75 mg total) by mouth daily.   DIPHENHYDRAMINE HCL PO Take 2 tablets by mouth at bedtime.   estradiol 1 MG tablet Commonly known as: ESTRACE Take 1 mg by mouth daily.   furosemide 20 MG tablet Commonly known as: Lasix Take 1 tablet (20 mg total) by mouth 2 (two) times daily.   levothyroxine 137 MCG tablet Commonly known as: SYNTHROID Take 137 mcg by mouth daily before breakfast.   lisinopril-hydrochlorothiazide 20-12.5 MG tablet Commonly known as: ZESTORETIC Take 1 tablet by mouth daily.   LORazepam 0.5 MG tablet Commonly known as: ATIVAN Take 0.125 mg by mouth every 8 (eight) hours as needed for anxiety.   nitroGLYCERIN 0.4 MG SL tablet Commonly known as: Nitrostat Place 1 tablet (0.4 mg total) under the tongue every 5 (five) minutes as needed for chest pain.  Allergies Allergies  Allergen Reactions   Amoxicillin Nausea And Vomiting    Headache   Codeine Other (See Comments)    "Bad headache"   Diltiazem Other (See Comments)    "Bad headache"    Outstanding Labs/Studies   BMP 1 week   Duration of Discharge Encounter   Greater than 30 minutes including physician time.  Signed, Orbie Pyo, MD 05/17/2023, 5:18 PM   ATTENDING ATTESTATION:  After conducting a review of all available clinical information with the care team, interviewing the patient, and performing a physical exam, I agree with the findings and plan described in this note.   GEN: No acute distress.   HEENT:  MMM, no JVD, no scleral icterus Cardiac: RRR, 3 out of 6 systolic ejection murmur without rubs,  or gallops.  Respiratory: Clear to auscultation bilaterally. GI: Soft, nontender, non-distended  MS: No edema; No deformity. Neuro:  Nonfocal  Vasc:  TR band in place  Patient doing well after elective PCI of the right coronary artery.  The patient will be discharged later today on Plavix therapy in addition to her aspirin.  Due to elevated filling pressures, the patient was prescribed Lasix 20 mg twice daily the BMP will be drawn in 1 week.  Patient recommended statin therapy but she has deferred due to issues with arthritis.  Will follow-up with research division regarding her candidacy for the progress continuing access research program.  Follow up as scheduled.     Alverda Skeans, MD Pager [phone]

## 2023-05-17 NOTE — Interval H&P Note (Signed)
History and Physical Interval Note:  05/17/2023 12:24 PM  Samantha Wood  has presented today for surgery, with the diagnosis of aortic stenosis - tavr workup.  The various methods of treatment have been discussed with the patient and family. After consideration of risks, benefits and other options for treatment, the patient has consented to  Procedure(s): RIGHT/LEFT HEART CATH AND CORONARY ANGIOGRAPHY (N/A) as a surgical intervention.  The patient's history has been reviewed, patient examined, no change in status, stable for surgery.  I have reviewed the patient's chart and labs.  Questions were answered to the patient's satisfaction.     Orbie Pyo

## 2023-05-17 NOTE — Telephone Encounter (Signed)
Left voicemail to return call to office.

## 2023-05-17 NOTE — Telephone Encounter (Signed)
   Transition of Care Follow-up Phone Call Request    Patient Name: Samantha Wood Date of Birth: [birth year] Date of Encounter: 05/17/2023  Primary Care Provider:  Richardean Chimera, MD Primary Cardiologist:  Orbie Pyo, MD  [name] has been scheduled for a transition of care follow up appointment with a [facility] provider:  Monday Jun 03, 2023  Arrive by 8:10 AMAppt at 8:25 AM (25 min)  [name].  Please repeat BMP in 1 week.  Please reach out to [name] within 48 hours of discharge to confirm appointment and review transition of care protocol questionnaire. Anticipated discharge date: Today  Abagail Kitchens, PA-C  05/17/2023, 2:58 PM

## 2023-05-20 ENCOUNTER — Other Ambulatory Visit: Payer: Self-pay

## 2023-05-20 ENCOUNTER — Encounter (HOSPITAL_COMMUNITY): Payer: Self-pay | Admitting: Internal Medicine

## 2023-05-20 DIAGNOSIS — Z006 Encounter for examination for normal comparison and control in clinical research program: Secondary | ICD-10-CM

## 2023-05-20 DIAGNOSIS — I350 Nonrheumatic aortic (valve) stenosis: Secondary | ICD-10-CM

## 2023-05-29 DIAGNOSIS — I5030 Unspecified diastolic (congestive) heart failure: Secondary | ICD-10-CM | POA: Insufficient documentation

## 2023-05-29 DIAGNOSIS — I5033 Acute on chronic diastolic (congestive) heart failure: Secondary | ICD-10-CM | POA: Insufficient documentation

## 2023-05-29 NOTE — Progress Notes (Signed)
 Cardiology Office Note:    Date:  06/03/2023  ID:  Samantha Wood, DOB Jan 24, 1946, MRN [id number] PCP: Toribio Jerel MATSU, MD  [facility] Providers Cardiologist:  Lurena MARLA Red, MD       Patient Profile:      Coronary artery disease  S/p 2.5 x 16 mm DES to [facility] in 04/2023 R/L [facility] 05/17/23: mRCA 90; mean RA 18, mean PCWP 27, mean PA 34, PVR 0.7 WU (HFpEF) heart failure with preserved ejection fraction  Mod aortic stenosis  TTE 05/03/23: EF 55, no RWMA, Gr 1 DD, GLS -14.7, NL RVSF, NL PASP, RVSP 24.3, mild LAE, mild MR, mild MS (mean 3 mmHg), trivial AI, mod AS (mean 34 mmHg, Vmax 348.5 cm/s, DI 0.33), RAP 3 Hypertension  Hyperlipidemia  Chronic kidney disease          History of Present Illness:  Discussed the use of AI scribe software for clinical note transcription with the patient, who gave verbal consent to proceed.  Samantha Wood is a 78 y.o. female who returns for follow up of CAD, CHF, AS. She was seen by Dr. Red in 04/2023 for progressive dyspnea on exertion. R and L cardiac catheterization was arranged and demonstrated severe RCA stenosis tx with DES. She was also noted to have elevated filling pressures and was started on furosemide . She is here with her friend. Post-procedure, the patient reports feeling generally well, with the exception of side effects from Lasix . The patient reports severe fatigue and difficulty moving, which she attributes to the Lasix . Despite these symptoms, the patient has been compliant with the medication, taking it once daily. The patient also reports a single episode of chest discomfort post-procedure, described as a sore or bruised feeling, which resolved spontaneously. This occurred with bending forward. She denies any recurrent chest discomfort, shortness of breath, or leg swelling. The patient has been able to perform daily activities, including walking, without any chest discomfort or breathlessness. She denies any recent changes in  bowel habits, bleeding, or urinary symptoms. The patient is a non-smoker and has been making dietary changes to manage her cholesterol levels, including reducing salt and bacon intake. She has previously been prescribed Zetia  but discontinued it due to gastrointestinal side effects. She is willing to try this again.     Review of Systems  Gastrointestinal:  Negative for hematochezia and melena.  Genitourinary:  Negative for hematuria.  -See HPI     Studies Reviewed:   EKG Interpretation Date/Time:  Monday June 03 2023 08:06:23 EST Ventricular Rate:  72 PR Interval:  156 QRS Duration:  88 QT Interval:  366 QTC Calculation: 400 R Axis:   -22  Text Interpretation: Normal sinus rhythm Left axis deviation Nonspecific ST abnormality No significant change since last tracing Confirmed by Lelon Hamilton [phone]) on 06/03/2023 11:55:59 AM   Results          Risk Assessment/Calculations:     HYPERTENSION CONTROL Vitals:   06/03/23 0801 06/03/23 0906  BP: (!) 158/90 (!) 144/80    The patient's blood pressure is elevated above target today.  In order to address the patient's elevated BP: Blood pressure will be monitored at home to determine if medication changes need to be made.          Physical Exam:   VS:  BP (!) 144/80   Pulse 72   Ht 5' 7 (1.702 m)   SpO2 94%   BMI 31.01 kg/m    Wt Readings  from Last 3 Encounters:  05/17/23 198 lb (89.8 kg)  05/03/23 198 lb (89.8 kg)  10/29/22 198 lb (89.8 kg)    Constitutional:      Appearance: Healthy appearance. Not in distress.  Neck:     Vascular: No JVR. JVD normal.  Pulmonary:     Breath sounds: Normal breath sounds. No wheezing. No rales.  Cardiovascular:     Normal rate. Regular rhythm.     Murmurs: There is a grade 3/6 harsh crescendo-decrescendo systolic murmur at the URSB.     Comments: R wrist w/o hematoma Edema:    Peripheral edema absent.  Abdominal:     Palpations: Abdomen is soft.        Assessment and Plan:    Assessment & Plan Coronary artery disease involving native coronary artery of native heart without angina pectoris S/p DES to mRCA. She is doing well w/o anginal symptoms. We discussed the importance of DAPT for the next 6 mos. Plan is to DC ASA after 6 mos and continue Clopidogrel  monotherapy. She declines statin Rx. She is willing to take Zetia . -Continue ASA 81 mg once daily, Clopidogrel  75 mg once daily -Start Zetia  10 mg  -Follow up 6 mos -She is ok to start cardiac rehabilitation.  Nonrheumatic aortic valve stenosis Mod AS by echocardiogram in 04/2023. She is enrolled in the PROGRESS trial. She has a CTA later today and sees Dr. Lucas next month.   Chronic heart failure with preserved ejection fraction (HCC) Volume stable. NYHA IIb. She could not tolerate Lasix  20 mg twice daily and has a hard time taking it once a day.  -BMET today -Change Lasix  to 20 mg once daily as needed for weight gain >/= 3 lbs in 24 hours -Consider Spironolactone if better BP control needed  Stage 3a chronic kidney disease (HCC) Obtain follow up BMET today.  Primary hypertension BP above goal. -Continue Lisinopril /hydrochlorothiazide 20/12.5 mg once daily -Monitor BP x 2 weeks and send for review -Consider Spironolactone vs increasing hydrochlorothiazide dose to 25 mg  Pure hypercholesterolemia LDL goal < 55. She is willing to try Zetia . -Zetia  10 mg once daily  -CMET, Lipids in 3 mos      Cardiac Rehabilitation Eligibility Assessment  The patient is ready to start cardiac rehabilitation from a cardiac standpoint.     Dispo:  Return in about 6 months (around 12/01/2023) for w/ Dr. Wendel.  Signed, Glendia Ferrier, PA-C

## 2023-06-03 ENCOUNTER — Other Ambulatory Visit: Payer: Self-pay | Admitting: *Deleted

## 2023-06-03 ENCOUNTER — Ambulatory Visit: Payer: Medicare Other | Admitting: Physician Assistant

## 2023-06-03 ENCOUNTER — Encounter: Payer: Self-pay | Admitting: Physician Assistant

## 2023-06-03 ENCOUNTER — Ambulatory Visit (HOSPITAL_COMMUNITY)
Admission: RE | Admit: 2023-06-03 | Discharge: 2023-06-03 | Disposition: A | Discharge status: Home / Self Care | Payer: Medicare Other | Source: Ambulatory Visit | Attending: Internal Medicine | Admitting: Internal Medicine

## 2023-06-03 VITALS — BP 144/80 | HR 72 | Ht 67.0 in | Wt 198.6 lb

## 2023-06-03 DIAGNOSIS — I5032 Chronic diastolic (congestive) heart failure: Secondary | ICD-10-CM

## 2023-06-03 DIAGNOSIS — I2510 Atherosclerotic heart disease of native coronary artery without angina pectoris: Secondary | ICD-10-CM | POA: Diagnosis not present

## 2023-06-03 DIAGNOSIS — I350 Nonrheumatic aortic (valve) stenosis: Secondary | ICD-10-CM | POA: Diagnosis not present

## 2023-06-03 DIAGNOSIS — I10 Essential (primary) hypertension: Secondary | ICD-10-CM | POA: Diagnosis not present

## 2023-06-03 DIAGNOSIS — N1831 Chronic kidney disease, stage 3a: Secondary | ICD-10-CM | POA: Insufficient documentation

## 2023-06-03 DIAGNOSIS — I70209 Unspecified atherosclerosis of native arteries of extremities, unspecified extremity: Secondary | ICD-10-CM | POA: Diagnosis not present

## 2023-06-03 DIAGNOSIS — E7800 Pure hypercholesterolemia, unspecified: Secondary | ICD-10-CM

## 2023-06-03 DIAGNOSIS — I701 Atherosclerosis of renal artery: Secondary | ICD-10-CM | POA: Diagnosis not present

## 2023-06-03 DIAGNOSIS — Z48812 Encounter for surgical aftercare following surgery on the circulatory system: Secondary | ICD-10-CM | POA: Diagnosis not present

## 2023-06-03 DIAGNOSIS — R918 Other nonspecific abnormal finding of lung field: Secondary | ICD-10-CM | POA: Diagnosis not present

## 2023-06-03 DIAGNOSIS — Z006 Encounter for examination for normal comparison and control in clinical research program: Secondary | ICD-10-CM | POA: Insufficient documentation

## 2023-06-03 LAB — BASIC METABOLIC PANEL
BUN/Creatinine Ratio: 17 (ref 12–28)
BUN: 26 mg/dL (ref 8–27)
CO2: 28 mmol/L (ref 20–29)
Calcium: 11.4 mg/dL — ABNORMAL HIGH (ref 8.7–10.3)
Chloride: 100 mmol/L (ref 96–106)
Creatinine, Ser: 1.56 mg/dL — ABNORMAL HIGH (ref 0.57–1.00)
Glucose: 98 mg/dL (ref 70–99)
Potassium: 4.6 mmol/L (ref 3.5–5.2)
Sodium: 143 mmol/L (ref 134–144)
eGFR: 34 mL/min/{1.73_m2} — ABNORMAL LOW (ref >59)

## 2023-06-03 MED ORDER — FUROSEMIDE 20 MG PO TABS: 20.0000 mg | ORAL_TABLET | Freq: Every day | ORAL | Status: DC | PRN

## 2023-06-03 MED ORDER — IOHEXOL 350 MG/ML SOLN
95.0000 mL | Freq: Once | INTRAVENOUS | Status: AC | PRN
  Administered 2023-06-03: 95 mL via INTRAVENOUS

## 2023-06-03 MED ORDER — EZETIMIBE 10 MG PO TABS: 10.0000 mg | ORAL_TABLET | Freq: Every day | ORAL | 3 refills | Status: DC

## 2023-06-03 NOTE — Assessment & Plan Note (Addendum)
 LDL goal < 55. She is willing to try Zetia. -Zetia 10 mg once daily  -CMET, Lipids in 3 mos

## 2023-06-03 NOTE — Assessment & Plan Note (Addendum)
 BP above goal. -Continue Lisinopril/hydrochlorothiazide 20/12.5 mg once daily -Monitor BP x 2 weeks and send for review -Consider Spironolactone vs increasing hydrochlorothiazide dose to 25 mg

## 2023-06-03 NOTE — Assessment & Plan Note (Addendum)
 Mod AS by echocardiogram in 04/2023. She is enrolled in the PROGRESS trial. She has a CTA later today and sees Dr. Laneta Simmers next month.

## 2023-06-03 NOTE — Assessment & Plan Note (Addendum)
Obtain follow-up BMET today. 

## 2023-06-03 NOTE — Patient Instructions (Addendum)
 Medication Instructions:  Your physician has recommended you make the following change in your medication:   START Zetia  10 mg taking 1 daily  REDUCE Lasix  to 20 mg taking daily only as needed for weight gain of 3 lbs in 24 hours   *If you need a refill on your cardiac medications before your next appointment, please call your pharmacy*   Lab Work: TODAY:  BMET  WEEK OF September 01, 2023:  GO TO A [facility], FASTING, FOR:  CMET & LIPID  If you have labs (blood work) drawn today and your tests are completely normal, you will receive your results only by: MyChart Message (if you have MyChart) OR A paper copy in the mail If you have any lab test that is abnormal or we need to change your treatment, we will call you to review the results.   Testing/Procedures: None ordered   Follow-Up: At [facility], you and your health needs are our priority.  As part of our continuing mission to provide you with exceptional heart care, we have created designated Provider Care Teams.  These Care Teams include your primary Cardiologist (physician) and Advanced Practice Providers (APPs -  Physician Assistants and Nurse Practitioners) who all work together to provide you with the care you need, when you need it.  We recommend signing up for the patient portal called MyChart.  Sign up information is provided on this After Visit Summary.  MyChart is used to connect with patients for Virtual Visits (Telemedicine).  Patients are able to view lab/test results, encounter notes, upcoming appointments, etc.  Non-urgent messages can be sent to your provider as well.   To learn more about what you can do with MyChart, go to [email or web address].    Your next appointment:   6 month(s)  Provider:   Arun K Thukkani, MD     Other Instructions

## 2023-06-03 NOTE — Assessment & Plan Note (Addendum)
 Volume stable. NYHA IIb. She could not tolerate Lasix  20 mg twice daily and has a hard time taking it once a day.  -BMET today -Change Lasix  to 20 mg once daily as needed for weight gain >/= 3 lbs in 24 hours -Consider Spironolactone if better BP control needed

## 2023-06-03 NOTE — Assessment & Plan Note (Addendum)
 S/p DES to [facility]. She is doing well w/o anginal symptoms. We discussed the importance of DAPT for the next 6 mos. Plan is to DC ASA after 6 mos and continue Clopidogrel  monotherapy. She declines statin Rx. She is willing to take Zetia . -Continue ASA 81 mg once daily, Clopidogrel  75 mg once daily -Start Zetia  10 mg  -Follow up 6 mos -She is ok to start cardiac rehabilitation.

## 2023-06-04 ENCOUNTER — Encounter: Payer: Self-pay | Admitting: Physician Assistant

## 2023-06-05 ENCOUNTER — Other Ambulatory Visit: Payer: Self-pay | Admitting: *Deleted

## 2023-06-05 ENCOUNTER — Telehealth: Payer: Self-pay | Admitting: *Deleted

## 2023-06-05 DIAGNOSIS — Z79899 Other long term (current) drug therapy: Secondary | ICD-10-CM

## 2023-06-05 NOTE — Telephone Encounter (Signed)
-----   Message from Marlyse Single sent at 06/04/2023  5:44 PM EST ----- Result note sent to Samantha Wood via MyChart. See comments below. PLAN:  -Hold lisinopril /HCTZ x 1 day, then resume -Take furosemide  only as needed for weight gain >3 pounds in 1 day -BMET 1 week  Ms. Thorns  Your creatinine (kidney function) has increased.  Your potassium is normal. Do not take lisinopril /HCTZ for 1 day, then resume once daily.  Take furosemide  20 mg only as needed for weight gain over 3 pounds in 1 day.  I will recheck a blood test in 1 week. Marlyse Single, PA-C    06/04/2023 5:41 PM

## 2023-06-12 DIAGNOSIS — Z79899 Other long term (current) drug therapy: Secondary | ICD-10-CM | POA: Diagnosis not present

## 2023-06-13 LAB — BASIC METABOLIC PANEL
BUN/Creatinine Ratio: 13 (ref 12–28)
BUN: 20 mg/dL (ref 8–27)
CO2: 25 mmol/L (ref 20–29)
Calcium: 10.4 mg/dL — ABNORMAL HIGH (ref 8.7–10.3)
Chloride: 101 mmol/L (ref 96–106)
Creatinine, Ser: 1.59 mg/dL — ABNORMAL HIGH (ref 0.57–1.00)
Glucose: 99 mg/dL (ref 70–99)
Potassium: 4.8 mmol/L (ref 3.5–5.2)
Sodium: 142 mmol/L (ref 134–144)
eGFR: 33 mL/min/{1.73_m2} — ABNORMAL LOW (ref >59)

## 2023-06-26 ENCOUNTER — Encounter: Payer: Medicare Other | Admitting: Surgery

## 2023-06-27 ENCOUNTER — Encounter: Payer: Medicare Other | Admitting: Surgery

## 2023-06-28 ENCOUNTER — Encounter: Payer: Self-pay | Admitting: Surgery

## 2023-06-28 ENCOUNTER — Institutional Professional Consult (permissible substitution): Payer: Medicare Other | Admitting: Surgery

## 2023-06-28 VITALS — BP 158/89 | HR 77 | Resp 18 | Ht 67.0 in | Wt 196.0 lb

## 2023-06-28 DIAGNOSIS — I350 Nonrheumatic aortic (valve) stenosis: Secondary | ICD-10-CM

## 2023-06-28 NOTE — Progress Notes (Signed)
 [facility]   [facility]       [address]       [place] [address]             [phone]          CARDIOTHORACIC SURGERY CONSULTATION REPORT  PCP is Toribio Jerel MATSU, MD Referring Provider is Lurena Red, MD Primary Cardiologist is Assar, Epimenio, MD  Reason for consultation:  moderate to severe aortic stenosis  HPI:  The patient is a 78 year old woman with a history of hypertension, hyperlipidemia, stage IIIb chronic kidney disease, osteoarthritis, coronary artery disease status post DES to the RCA on 05/17/2023, and moderate to severe aortic stenosis who was referred for consideration of TAVR.  She was seen initially by Dr. Red in June 2024 concerning her aortic stenosis.  Echocardiogram at that time showed moderate to severe aortic stenosis with normal left ventricular systolic function.  She was reporting increasing exertional chest discomfort, fatigue, and dizziness.  She decided that she did not want to proceed with further workup at that time.  She was seen back in December 2024 with a transthoracic echo showing an aortic valve mean gradient of 32 mmHg with a peak velocity of 3.5 m/s and aortic valve area of 0.91 cm with preserved left ventricular systolic function.  She reported being more symptomatic with exertional fatigue, shortness of breath, and chest tightness with exertion and at rest.  She also reported some dizziness when standing up and felt like she was going to lose her balance.  She subsequently underwent cardiac catheterization on 05/17/2023 which showed a 90% mid RCA stenosis that was successfully stented.  She was started on dual antiplatelet therapy.  Her PA pressure was 50/25 with a mean of 34 and a wedge pressure of 27 with V waves up to 34 mmHg.  She was started on Lasix  20 mg daily due to elevated filling pressures.  She had her electrolytes checked on 06/03/2023 and this showed an increase in her  creatinine to 1.56 felt to be due to dehydration.  Her Lasix  was stopped.  She has continue to monitor her weight which has been stable.  She has had no further chest discomfort since her stent was placed.  She is here today by herself.  She is widowed and lives with her friend Dempsey.  She had a son who had coronary bypass surgery and subsequently died last year.  Past Medical History:  Diagnosis Date   Aortic stenosis, moderate    Arthritis    Follicular thyroid  carcinoma (HCC)    History of chicken pox    Hypertension    Hypocalcemia    Sarcoidosis    Vitiligo     Past Surgical History:  Procedure Laterality Date   ABDOMINAL HYSTERECTOMY  10/1984   CHOLECYSTECTOMY  07/1992   three weeks after gallstone removal   CORONARY STENT INTERVENTION N/A 05/17/2023   Procedure: CORONARY STENT INTERVENTION;  Surgeon: Red Lurena POUR, MD;  Location: [facility];  Service: Cardiovascular;  Laterality: N/A;   Gallstone removal from Bile Duct  07/1992   RIGHT HEART CATH AND CORONARY ANGIOGRAPHY N/A 05/17/2023   Procedure: RIGHT HEART CATH AND CORONARY ANGIOGRAPHY;  Surgeon: Red Lurena POUR, MD;  Location: [facility];  Service: Cardiovascular;  Laterality: N/A;   TOTAL THYROIDECTOMY  12/2011   TUBAL LIGATION  10/1980   TUMOR EXCISION  05/1972   left side of thyroid     Family  History  Problem Relation Age of Onset   Heart disease Mother    Stroke Mother    Hypertension Mother    Diabetes Mother    Heart disease Father    Diabetes Maternal Grandmother     Social History   Socioeconomic History   Marital status: Widowed    Spouse name: Not on file   Number of children: 1   Years of education: 14   Highest education level: Not on file  Occupational History   Occupation: Retired  Tobacco Use   Smoking status: Never   Smokeless tobacco: Never  Substance and Sexual Activity   Alcohol  use: No   Drug use: Not on file   Sexual activity: Not on file  Other Topics  Concern   Not on file  Social History Narrative   Regular exercise-yes   Caffeine Use-no   Social Drivers of Health   Financial Resource Strain: Not on file  Food Insecurity: Not on file  Transportation Needs: Not on file  Physical Activity: Not on file  Stress: Not on file  Social Connections: Not on file  Intimate Partner Violence: Not on file    Prior to Admission medications   Medication Sig Start Date End Date Taking? Authorizing Provider  acetaminophen  (TYLENOL ) 650 MG CR tablet Take 1,300 mg by mouth 2 (two) times daily.   Yes [provider]  aspirin  EC 81 MG tablet Take 1 tablet (81 mg total) by mouth daily. Swallow whole. 05/17/23  Yes Thukkani, Arun K, MD  calcitRIOL (ROCALTROL) 0.25 MCG capsule Take 0.25 mcg by mouth 2 (two) times daily.  01/23/12  Yes [provider]  clopidogrel  (PLAVIX ) 75 MG tablet Take 1 tablet (75 mg total) by mouth daily. 05/17/23  Yes Thukkani, Arun K, MD  Cranberry-Vitamin C-Probiotic (AZO CRANBERRY PO) Take 1 tablet by mouth 2 (two) times daily.   Yes [provider]  DIPHENHYDRAMINE  HCL PO Take 2 tablets by mouth at bedtime.   Yes [provider]  estradiol  (ESTRACE ) 1 MG tablet Take 1 mg by mouth daily.  01/04/12  Yes [provider]  ezetimibe  (ZETIA ) 10 MG tablet Take 1 tablet (10 mg total) by mouth daily. 06/03/23 09/01/23 Yes Weaver, Scott T, PA-C  furosemide  (LASIX ) 20 MG tablet Take 1 tablet (20 mg total) by mouth daily as needed for fluid (WEIGHT GAIN 3 LBS IN 1 DAY). 06/03/23 09/01/23 Yes Weaver, Scott T, PA-C  levothyroxine  (SYNTHROID ) 137 MCG tablet Take 137 mcg by mouth daily before breakfast. 04/29/23  Yes [provider]  lisinopril -hydrochlorothiazide (PRINZIDE,ZESTORETIC) 20-12.5 MG per tablet Take 1 tablet by mouth daily.  02/02/12  Yes [provider]  LORazepam  (ATIVAN ) 0.5 MG tablet Take 0.125 mg by mouth every 8 (eight) hours as needed for anxiety. 12/14/11  Yes [provider]  Multiple Minerals-Vitamins (CALCIUM-MAGNESIUM -ZINC-D3 PO) Take 2 tablets by mouth 2 (two) times daily.   Yes [provider]  nitroGLYCERIN  (NITROSTAT ) 0.4 MG SL tablet Place 1 tablet (0.4 mg total) under the tongue every 5 (five) minutes as needed for chest pain. 05/17/23 05/16/24 Yes Darryle Currier L, PA-C  trolamine salicylate (BLUE-EMU HEMP) 10 % cream Apply 1 Application topically at bedtime.   Yes [provider]    Current Outpatient Medications  Medication Sig Dispense Refill   acetaminophen  (TYLENOL ) 650 MG CR tablet Take 1,300 mg by mouth 2 (two) times daily.     aspirin  EC 81 MG tablet Take 1 tablet (81 mg total) by  mouth daily. Swallow whole. 90 tablet 1   calcitRIOL (ROCALTROL) 0.25 MCG capsule Take 0.25 mcg by mouth 2 (two) times daily.      clopidogrel  (PLAVIX ) 75 MG tablet Take 1 tablet (75 mg total) by mouth daily. 90 tablet 6   Cranberry-Vitamin C-Probiotic (AZO CRANBERRY PO) Take 1 tablet by mouth 2 (two) times daily.     DIPHENHYDRAMINE  HCL PO Take 2 tablets by mouth at bedtime.     estradiol  (ESTRACE ) 1 MG tablet Take 1 mg by mouth daily.      ezetimibe  (ZETIA ) 10 MG tablet Take 1 tablet (10 mg total) by mouth daily. 90 tablet 3   furosemide  (LASIX ) 20 MG tablet Take 1 tablet (20 mg total) by mouth daily as needed for fluid (WEIGHT GAIN 3 LBS IN 1 DAY).     levothyroxine  (SYNTHROID ) 137 MCG tablet Take 137 mcg by mouth daily before breakfast.     lisinopril -hydrochlorothiazide (PRINZIDE,ZESTORETIC) 20-12.5 MG per tablet Take 1 tablet by mouth daily.      LORazepam  (ATIVAN ) 0.5 MG tablet Take 0.125 mg by mouth every 8 (eight) hours as needed for anxiety.     Multiple Minerals-Vitamins (CALCIUM-MAGNESIUM -ZINC-D3 PO) Take 2 tablets by mouth 2 (two) times daily.     nitroGLYCERIN  (NITROSTAT ) 0.4 MG SL tablet Place 1 tablet (0.4 mg total) under the tongue every 5 (five) minutes as needed for chest pain. 25 tablet 2   trolamine salicylate (BLUE-EMU  HEMP) 10 % cream Apply 1 Application topically at bedtime.     No current facility-administered medications for this visit.    Allergies  Allergen Reactions   Amoxicillin Nausea And Vomiting    Headache   Codeine Other (See Comments)    Bad headache   Diltiazem Other (See Comments)    Bad headache      Review of Systems:   General:  normal appetite, + decreased energy, no weight gain, no weight loss, no fever  Cardiac:  no chest pain with exertion, no chest pain at rest, +SOB with moderate exertion, no resting SOB, no PND, no orthopnea, no palpitations, no arrhythmia, no atrial fibrillation, no LE edema, + dizzy spells, no syncope  Respiratory:  + exertional shortness of breath, no home oxygen , no productive cough, no dry cough, no bronchitis, no wheezing, no hemoptysis, no asthma, no pain with inspiration or cough, no sleep apnea, no CPAP at night  GI:   no difficulty swallowing, no reflux, no frequent heartburn, no hiatal hernia, no abdominal pain, no constipation, no diarrhea, no hematochezia, no hematemesis, no melena  GU:   no dysuria,  no frequency, no urinary tract infection, no hematuria, no kidney stones, +  kidney disease  Vascular:  no pain suggestive of claudication, no pain in feet, no leg cramps, no varicose veins, no DVT, no non-healing foot ulcer  Neuro:   no stroke, no TIA's, no seizures, no headaches, no temporary blindness one eye,  no slurred speech, no peripheral neuropathy, no chronic pain, no instability of gait, no memory/cognitive dysfunction  Musculoskeletal: + arthritis, no joint swelling, no myalgias, no difficulty walking, normal mobility   Skin:   no rash, no itching, no skin infections, no pressure sores or ulcerations  Psych:   no anxiety, no depression, no nervousness, no unusual recent stress  Eyes:   no blurry vision, no floaters, no recent vision changes, + wears glasses   ENT:   no hearing loss, no loose or painful teeth, no dentures, last saw  dentist few  months ago  Hematologic:  no easy bruising, no abnormal bleeding, no clotting disorder, no frequent epistaxis  Endocrine:  no diabetes, does not check CBG's at home     Physical Exam:   BP (!) 158/89 (BP Location: Left Arm)   Pulse 77   Resp 18   Ht 5' 7 (1.702 m)   Wt 196 lb (88.9 kg)   SpO2 94% Comment: ra  BMI 30.70 kg/m   General:  Elderly woman,   well-appearing  HEENT:  Unremarkable, NCAT, PERLA, EOMI  Neck:   no JVD, no bruits, no adenopathy   Chest:   clear to auscultation, symmetrical breath sounds, no wheezes, no rhonchi   CV:   RRR, 3/6 systolic murmur RSB, no diastolic murmur  Abdomen:  soft, non-tender, no masses   Extremities:  warm, well-perfused, pedal pulses palpable,  lower extremity edema  Rectal/GU  Deferred  Neuro:   Grossly non-focal and symmetrical throughout  Skin:   Clean and dry, no rashes, no breakdown  Diagnostic Tests:  ECHOCARDIOGRAM REPORT       Patient Name:   Samantha Wood Date of Exam: 05/03/2023  Medical Rec #:  [id number]     Height:       67.0 in  Accession #:    [id number]    Weight:       198.0 lb  Date of Birth:  [birth year]     BSA:          2.014 m  Patient Age:    77 years      BP:           142/70 mmHg  Patient Gender: F             HR:           64 bpm.  Exam Location:  [address]   Procedure: 2D Echo, 3D Echo, Cardiac Doppler, Color Doppler and Strain  Analysis   Indications:   Aortic stenosis I35.0    History:        Patient has prior history of Echocardiogram examinations,  most                 recent 09/26/2022. Aortic Valve Disease; Risk                  Factors:Hypertension. Sarcoidosis.    Sonographer:    Lauraine Pilot RDCS  Referring Phys: [id number] ARUN K THUKKANI     Sonographer Comments: Global longitudinal strain was attempted.  IMPRESSIONS     1. Left ventricular ejection fraction, by estimation, is 55%. The left  ventricle has normal function. The left ventricle has no regional Sperl   motion abnormalities. Left ventricular diastolic parameters are consistent  with Grade I diastolic dysfunction  (impaired relaxation). The average left ventricular global longitudinal  strain is -14.7 %. The global longitudinal strain is abnormal.   2. Right ventricular systolic function is normal. The right ventricular  size is normal. There is normal pulmonary artery systolic pressure. The  estimated right ventricular systolic pressure is 24.3 mmHg.   3. Left atrial size was mildly dilated.   4. The mitral valve is normal in structure. Mild mitral valve  regurgitation. Mild mitral stenosis. The mean mitral valve gradient is 3.0  mmHg.   5. The aortic valve is tricuspid. There is severe calcifcation of the  aortic valve. Aortic valve regurgitation is trivial. Aortic valve mean  gradient measures 34.0 mmHg.   6. The inferior vena cava  is normal in size with greater than 50%  respiratory variability, suggesting right atrial pressure of 3 mmHg.   FINDINGS   Left Ventricle: Left ventricular ejection fraction, by estimation, is  55%. The left ventricle has normal function. The left ventricle has no  regional Richman motion abnormalities. The average left ventricular global  longitudinal strain is -14.7 %. The  global longitudinal strain is abnormal. The left ventricular internal  cavity size was normal in size. There is no left ventricular hypertrophy.  Left ventricular diastolic parameters are consistent with Grade I  diastolic dysfunction (impaired relaxation).   Right Ventricle: The right ventricular size is normal. No increase in  right ventricular Eckstein thickness. Right ventricular systolic function is  normal. There is normal pulmonary artery systolic pressure. The tricuspid  regurgitant velocity is 2.31 m/s, and   with an assumed right atrial pressure of 3 mmHg, the estimated right  ventricular systolic pressure is 24.3 mmHg.   Left Atrium: Left atrial size was mildly dilated.    Right Atrium: Right atrial size was normal in size.   Pericardium: There is no evidence of pericardial effusion.   Mitral Valve: The mitral valve is normal in structure. There is moderate  calcification of the mitral valve leaflet(s). Mild mitral annular  calcification. Mild mitral valve regurgitation. Mild mitral valve  stenosis. MV peak gradient, 7.4 mmHg. The mean  mitral valve gradient is 3.0 mmHg.   Tricuspid Valve: The tricuspid valve is normal in structure. Tricuspid  valve regurgitation is mild.   Aortic Valve: The aortic valve is tricuspid. There is severe calcifcation  of the aortic valve. Aortic valve regurgitation is trivial. Aortic valve  mean gradient measures 34.0 mmHg. Aortic valve peak gradient measures 48.6  mmHg. Aortic valve area, by VTI  measures 0.92 cm.   Pulmonic Valve: The pulmonic valve was normal in structure. Pulmonic valve  regurgitation is not visualized.   Aorta: The aortic root is normal in size and structure.   Venous: The inferior vena cava is normal in size with greater than 50%  respiratory variability, suggesting right atrial pressure of 3 mmHg.   IAS/Shunts: No atrial level shunt detected by color flow Doppler.     LEFT VENTRICLE  PLAX 2D  LVIDd:         4.36 cm   Diastology  LVIDs:         3.10 cm   LV e' medial:    6.53 cm/s  LV PW:         0.92 cm   LV E/e' medial:  18.8  LV IVS:        1.03 cm   LV e' lateral:   6.00 cm/s  LVOT diam:     1.90 cm   LV E/e' lateral: 20.5  LV SV:         90  LV SV Index:   45        2D Longitudinal Strain  LVOT Area:     2.84 cm  2D Strain GLS Avg:     -14.7 %                             3D Volume EF:                           3D EF:        51 %  LV EDV:       149 ml                           LV ESV:       74 ml                           LV SV:        75 ml   RIGHT VENTRICLE  RV S prime:     19.30 cm/s  TAPSE (M-mode): 2.2 cm   LEFT ATRIUM             Index         RIGHT ATRIUM          Index  LA diam:        4.40 cm 2.19 cm/m   RA Area:     9.84 cm  LA Vol (A2C):   62.6 ml 31.09 ml/m  RA Volume:   18.90 ml 9.39 ml/m  LA Vol (A4C):   62.6 ml 31.09 ml/m  LA Biplane Vol: 62.7 ml 31.14 ml/m   AORTIC VALVE  AV Area (Vmax):    1.16 cm  AV Area (Vmean):   0.98 cm  AV Area (VTI):     0.92 cm  AV Vmax:           348.50 cm/s  AV Vmean:          275.000 cm/s  AV VTI:            0.977 m  AV Peak Grad:      48.6 mmHg  AV Mean Grad:      34.0 mmHg  LVOT Vmax:         142.00 cm/s  LVOT Vmean:        95.000 cm/s  LVOT VTI:          0.318 m  LVOT/AV VTI ratio: 0.33    AORTA  Ao Root diam: 2.90 cm  Ao Asc diam:  3.20 cm   MITRAL VALVE                TRICUSPID VALVE  MV Area (PHT): 2.50 cm     TR Peak grad:   21.3 mmHg  MV Area VTI:   1.84 cm     TR Vmax:        231.00 cm/s  MV Peak grad:  7.4 mmHg  MV Mean grad:  3.0 mmHg     SHUNTS  MV Vmax:       1.36 m/s     Systemic VTI:  0.32 m  MV Vmean:      83.2 cm/s    Systemic Diam: 1.90 cm  MV E velocity: 123.00 cm/s  MV A velocity: 128.00 cm/s  MV E/A ratio:  0.96   Dalton McleanMD  Electronically signed by Ezra Kanner  Signature Date/Time: 05/03/2023/12:19:21 PM        Final      Physicians  Panel Physicians Referring Physician Case Authorizing Physician  Thukkani, Arun K, MD (Primary)     Procedures  CORONARY STENT INTERVENTION  RIGHT HEART CATH AND CORONARY ANGIOGRAPHY   Conclusion      Mid RCA lesion is 90% stenosed.   A stent was successfully placed.   Post intervention, there is a 0% residual stenosis.   1.  High-grade mid right coronary artery lesion treated with 1 drug-eluting stent. 2.  Fick cardiac output of 10.1 L/min and Fick cardiac index of 5.0 L/min/m with the following hemodynamics:            Right atrial pressure mean of 18 mmHg.            RV 48/10 with an end-diastolic pressure of 23 mmHg.            Wedge pressure mean of 27 mmHg with V waves to 34  mmHg.            PA pressure 50/25 with a mean of 34 mmHg            PVR of 0.7 Woods units            PA pulsatility index of 1.4   Recommendation: Dual antiplatelet therapy for 6 months followed by indefinite Plavix  monotherapy; start Lasix  20 mg twice daily due to elevated filling pressures and check BMP in 1 week.  Will determine whether patient remains a candidate for PROGRESS CAP research program.   Indications  Angina pectoris (HCC) [I20.9 (ICD-10-CM)]  Nonrheumatic aortic valve stenosis [I35.0 (ICD-10-CM)]   Procedural Details  Technical Details The patient is a 78 year old female with a history of moderate aortic stenosis, mild coronary artery disease on a cardiac catheterization a few years ago, hypertension, hyperlipidemia, and CKD stage III who was seen in the outpatient setting due to chest pain and dyspnea.  She was enrolled in the progress trial and is referred for coronary angiography and right heart catheterization for preprocedural evaluation prior to aortic valve intervention.  After obtaining consent the patient brought to the cardiac catheterization laboratory and prepped in the sterile fashion.  Xylocaine  was used to anesthetize the right wrist and a 6 French femoral glide sheath was placed.  5000 units heparin  and 5 mg verapamil  were administered through the sheath. A previously placed antecubital IV was exchanged for a 5 French Terumo glide sheath.  5 French balloontipped catheter was used for right heart catheterization.  A 6 French TIG catheter was used for selective coronary angiography.  After review of the angiogram and considering the patient's chest pain symptoms.  PCI of the mid right coronary artery lesion was pursued.  The patient was loaded with 600 mg of Plavix .  After heparinizing to an ACT of greater than 250, a 6 French JR4 guiding catheter was used to cannulate the right coronary artery.  A [place] blue wire was used to cross the lesion.  It was predilated  with a 2.0 mm balloon and a 2.5 mm balloon.  This allowed entry and positioning of a 2.5 x 16 mm Synergy XD stent that was postdilated to high pressures with a 2.5 mm Victoria balloon.  The end result was good with no evidence of dissection, perforation, or thrombosis.  A TR band was placed and manual pressure was applied to the antecubital site.  There were no acute complications.   Estimated blood loss <50 mL.   During this procedure medications were administered to achieve and maintain moderate conscious sedation while the patient's heart rate, blood pressure, and oxygen  saturation were continuously monitored and I was present face-to-face 100% of this time.   Medications (Filter: Administrations occurring from 1252 to 1410 on 05/17/23) Heparin  (Porcine) in NaCl 1000-0.9 UT/500ML-% SOLN (mL)  Total volume: 1,000 mL Date/Time Rate/Dose/Volume Action   05/17/23 1257 500 mL Given   1257 500 mL Given   fentaNYL  (SUBLIMAZE ) injection (mcg)  Total dose: 50 mcg Date/Time Rate/Dose/Volume Action  05/17/23 1304 25 mcg Given   1336 25 mcg Given   midazolam  (VERSED ) injection (mg)  Total dose: 2 mg Date/Time Rate/Dose/Volume Action   05/17/23 1304 1 mg Given   1336 1 mg Given   lidocaine  (PF) (XYLOCAINE ) 1 % injection (mL)  Total volume: 5 mL Date/Time Rate/Dose/Volume Action   05/17/23 1306 5 mL Given   Radial Cocktail/Verapamil  only (mL)  Total volume: 10 mL Date/Time Rate/Dose/Volume Action   05/17/23 1309 10 mL Given   heparin  sodium (porcine) injection (Units)  Total dose: 9,000 Units Date/Time Rate/Dose/Volume Action   05/17/23 1309 5,000 Units Given   1320 4,000 Units Given   clopidogrel  (PLAVIX ) tablet (mg)  Total dose: 600 mg Date/Time Rate/Dose/Volume Action   05/17/23 1325 600 mg Given   nitroGLYCERIN  1 mg/10 mL (100 mcg/mL) - IR/CATH LAB (mcg)  Total dose: 150 mcg Date/Time Rate/Dose/Volume Action   05/17/23 1350 150 mcg Given   iohexol  (OMNIPAQUE ) 350 MG/ML injection  (mL)  Total volume: 110 mL Date/Time Rate/Dose/Volume Action   05/17/23 1357 110 mL Given    Sedation Time  Sedation Time Physician-1: 47 minutes 50 seconds Contrast     Administrations occurring from 1252 to 1410 on 05/17/23:  Medication Name Total Dose  iohexol  (OMNIPAQUE ) 350 MG/ML injection 110 mL   Radiation/Fluoro  Fluoro time: 12.7 (min) DAP: 35974 (mGycm2) Cumulative Air Kerma: 568 (mGy) Complications  Complications documented before study signed (05/17/2023  2:10 PM)   No complications were associated with this study.  Documented by Janit Corean SAUNDERS, RT - 05/17/2023  1:52 PM     Coronary Findings  Diagnostic Dominance: Co-dominant Right Coronary Artery  Mid RCA lesion is 90% stenosed.    Intervention   Mid RCA lesion  Stent  A stent was successfully placed.  Post-Intervention Lesion Assessment  The intervention was successful. Pre-interventional TIMI flow is 3. Post-intervention TIMI flow is 3.  There is a 0% residual stenosis post intervention.     Coronary Diagrams  Diagnostic Dominance: Co-dominant  Intervention    Implants   Permanent Stent  Synergy Xd 2.50x16 - [id number] - Implanted  Inventory item: SYNERGY XD 2.50X16 Model/Cat number: [id number]  Manufacturer: [employer] Lot number: [id number]  Device identifier: [id number] Device identifier type: GS1  GUDID Information  Request status Successful    Brand name: LORRIN STAPLER Version/Model: [id number]  Company name: [employer] MRI safety info as of 05/17/23: MR Conditional  Contains dry or latex rubber: No    GMDN P.T. name: Drug-eluting coronary artery stent, non-bioabsorbable-polymer-coated    As of 05/17/2023  Status: Implanted     Syngo Images   Show images for CARDIAC CATHETERIZATION Images on Long Term Storage   Show images for Espe, Rudell DELENA Hoover Link to Procedure Log  Procedure Log    Hemodynamics  Pressures Phases Resting   Right     RA Mean  mmHg 18    RA A-Wave  mmHg 22    RA V-Wave  mmHg 20  Pulmonary     PA  mmHg 50/25 (34)    PCW Mean  mmHg 27.0    PCW A-Wave  mmHg 29.0    PCW V-Wave  mmHg 34.0    PAPi   1.4    Saturations Phases Resting    PA  % 69    Arterial  % 89    Hemo Data  Flowsheet Row Most Recent Value  Fick Cardiac Output 10.05 L/min  Fick Cardiac Output Index 4.99 (L/min)/BSA  RA A Wave 22 mmHg  RA V Wave 20 mmHg  RA Mean 18 mmHg  RV Systolic Pressure 48 mmHg  RV Diastolic Pressure 10 mmHg  RV EDP 23 mmHg  PA Systolic Pressure 50 mmHg  PA Diastolic Pressure 25 mmHg  PA Mean 34 mmHg  PW A Wave 29 mmHg  PW V Wave 34 mmHg  PW Mean 27 mmHg  AO Systolic Pressure 154 mmHg  AO Diastolic Pressure 74 mmHg  AO Mean 107 mmHg  QP/QS 0.7  TPVR Index 9.73 HRUI  TSVR Index 22.25 HRUI  PVR SVR Ratio 0.11  TPVR/TSVR Ratio 0.44   ADDENDUM REPORT: 06/10/2023 16:15   EXAM: OVER-READ INTERPRETATION  CT CHEST   The following report is an over-read performed by radiologist Dr. Andrea Gasman of [facility] on 06/10/2023. This over-read does not include interpretation of cardiac or coronary anatomy or pathology. The coronary CTA interpretation by the cardiologist is attached.   COMPARISON:  Concurrent full field of view chest CTA, reported separately.   FINDINGS: Vascular: Aortic atherosclerosis. The included aorta is normal in caliber.   Mediastinum/nodes: No adenopathy or mass.  Decompressed esophagus.   Lungs: No focal airspace disease. 8 mm a subpleural left upper lobe nodule series 6, image 25 4 mm perifissural left upper lobe nodule series 6, image 29, typical of an intrapulmonary lymph node. No pleural fluid. The included airways are patent.   Upper abdomen: No acute or unexpected findings.   Musculoskeletal: There are no acute or suspicious osseous abnormalities.   IMPRESSION: 1.  Aortic Atherosclerosis (ICD10-I70.0). 2. Pleural based 8 mm left  upper lobe nodule. Per Fleischner Society guidelines, noncontrast chest CT at 6-12 months is recommended.     Electronically Signed   By: Andrea Gasman M.D.   On: 06/10/2023 16:15    Addended by Gasman Andrea NOVAK, MD on 06/10/2023  4:17 PM    Study Result  Narrative & Impression  CLINICAL DATA:  Severe Aortic Stenosis.   EXAM: Cardiac TAVR CT   TECHNIQUE: A non-contrast, gated CT scan was obtained with axial slices of 3 mm through the heart for aortic valve calcium scoring. A 110 kV retrospective, gated, contrast cardiac scan was obtained. Gantry rotation speed was 250 msecs and collimation was 0.6 mm. Nitroglycerin  was not given. The 3D data set was reconstructed in 5% intervals of the 0-95% of the R-R cycle. Systolic and diastolic phases were analyzed on a dedicated workstation using MPR, MIP, and VRT modes. The patient received 100 cc of contrast.   FINDINGS: Image quality: Excellent.   Noise artifact is: Limited.   Valve Morphology: Tricuspid aortic valve with severely calcified leaflets and severely restricted leaflet motion in systole. Bulky calcification of the NCC.   Aortic Valve Calcium score: 1958   Aortic annular dimension:   Phase assessed: 35%   Annular area: 450 mm2   Annular perimeter: 76.3 mm   Max diameter: 26.6 mm   Min diameter: 21.8 mm   Annular and subannular calcification: None.   Membranous septum length: 4.8 mm   Optimal coplanar projection: LAO 26 CRA 6   Coronary Artery Height above Annulus:   Left Main: 16.0 mm   Right Coronary: 13.3 mm   Sinus of Valsalva Measurements:   Non-coronary: 31 mm   Right-coronary: 30 mm   Left-coronary: 30 mm   Sinus of Valsalva Height:   Non-coronary: 20.7 mm   Right-coronary: 21.7 mm   Left-coronary: 22.4 mm   Sinotubular Junction: 26 mm  Ascending Thoracic Aorta: 31 mm   Coronary Arteries: Normal coronary origin. Right dominance. The study was performed without use of NTG  and is insufficient for plaque evaluation. Please refer to recent cardiac catheterization for coronary assessment.   Cardiac Morphology:   Right Atrium: Right atrial size is within normal limits.   Right Ventricle: The right ventricular cavity is within normal limits.   Left Atrium: Left atrial size is dilated with no left atrial appendage filling defect.   Left Ventricle: The ventricular cavity size is within normal limits.   Pulmonary arteries: Normal in size without proximal filling defect.   Pulmonary veins: Normal pulmonary venous drainage.   Pericardium: Normal thickness with no significant effusion or calcium present.   Mitral Valve: The mitral valve is normal structure with mild annular calcium.   Extra-cardiac findings: See attached radiology report for non-cardiac structures.   IMPRESSION: 1. Annular measurements support a 26 mm S3 or 29 mm Evolut Pro TAVR.   2. No significant annular or subannular calcifications.   3. Sufficient coronary to annulus distance.   4. Optimal Fluoroscopic Angle for Delivery: LAO 26 CRA 6   Ridgway T. Barbaraann, MD   Electronically Signed: By: Darryle Barbaraann M.D. On: 06/03/2023 15:09       Narrative & Impression  CLINICAL DATA:  Preoperative evaluation prior to TAVR   EXAM: CT ANGIOGRAPHY CHEST, ABDOMEN AND PELVIS   TECHNIQUE: Non-contrast CT of the chest was initially obtained.   Multidetector CT imaging through the chest, abdomen and pelvis was performed using the standard protocol during bolus administration of intravenous contrast. Multiplanar reconstructed images and MIPs were obtained and reviewed to evaluate the vascular anatomy.   RADIATION DOSE REDUCTION: This exam was performed according to the departmental dose-optimization program which includes automated exposure control, adjustment of the mA and/or kV according to patient size and/or use of iterative reconstruction technique.   CONTRAST:  95mL OMNIPAQUE   IOHEXOL  350 MG/ML SOLN   COMPARISON:  Prior CT scan of the abdomen and pelvis 02/27/2023   FINDINGS: CTA CHEST FINDINGS   Cardiovascular: Thickened and calcified aortic valve. The heart is normal in size. No pericardial effusion. Two vessel arch anatomy. The right brachiocephalic and left common carotid artery share a common origin. No evidence of aortic aneurysm or dissection. Trace scattered atherosclerotic plaque. Calcifications along the left anterior descending, circumflex and right coronary arteries.   Mediastinum/Nodes: No mediastinal mass or suspicious adenopathy.   Lungs/Pleura: 8 mm subpleural pulmonary nodule in the periphery of the left upper lobe (image 183 series 4). 4 mm subpleural pulmonary nodule affiliated with the left major fissure (image 160 series 4). No focal airspace infiltrate, pleural effusion or pneumothorax.   Musculoskeletal: No acute fracture or aggressive appearing lytic or blastic osseous lesion.   Review of the MIP images confirms the above findings.   CTA ABDOMEN AND PELVIS FINDINGS   VASCULAR   Aorta: No aneurysm or dissection. Scattered calcified atherosclerotic plaque.   Celiac: Patent without evidence of aneurysm, dissection, vasculitis or significant stenosis.   SMA: Calcified atherosclerotic plaque at the origin results in mild stenosis.   Renals: Solitary renal arteries bilaterally. Calcified plaque results in mild to moderate stenosis of both renal artery origins.   IMA: Patent without evidence of aneurysm, dissection, vasculitis or significant stenosis.   Inflow: Patent without evidence of aneurysm, dissection, vasculitis or significant stenosis. Outflow arteries are also widely patent and robust. There is a high bifurcation of the left common femoral artery.  Veins: No obvious venous abnormality within the limitations of this arterial phase study.   Review of the MIP images confirms the above findings.    NON-VASCULAR   Hepatobiliary: Normal hepatic contour and morphology. No discrete hepatic lesion. The gallbladder is surgically absent. Diffuse mild enlargement of the common bile duct to 1.5 cm. No evidence of choledocholithiasis or distal obstructing mass.   Pancreas: Unremarkable. No pancreatic ductal dilatation or surrounding inflammatory changes.   Spleen: Normal in size without focal abnormality.   Adrenals/Urinary Tract: Adrenal glands are unremarkable. Kidneys are normal, without renal calculi, focal lesion, or hydronephrosis. Bladder is unremarkable.   Stomach/Bowel: Unremarkable appearance of the stomach and duodenum. Colonic diverticular disease without CT evidence of active inflammation. No evidence of obstruction or focal bowel [place] thickening. Normal appendix in the right lower quadrant. The terminal ileum is unremarkable.   Lymphatic: No suspicious lymphadenopathy.   Reproductive: Status post hysterectomy. No adnexal masses.   Other: No abdominal Derderian hernia or abnormality. No abdominopelvic ascites.   Musculoskeletal: No acute fracture or aggressive appearing lytic or blastic osseous lesion.   Review of the MIP images confirms the above findings.   IMPRESSION: VASCULAR   1. Thickened and calcified aortic valve consistent with the clinical history of aortic valvular disease. 2. No evidence of aortic dissection or aneurysm. Scattered atherosclerotic calcified plaque. 3. High bifurcation of the left common femoral artery. 4. Normal anatomic bifurcation of the right common femoral artery. 5. Femoral and iliac access vessels are robust with minimal atherosclerotic plaque and tortuosity. 6. 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 7. Calcified atherosclerotic plaque along the coronary arteries.   NON VASCULAR   1. Approximately 8 mm subpleural pulmonary nodule along the lateral aspect of the left upper lobe. Non-contrast chest CT at 6-12  months is recommended. If the nodule is stable at time of repeat CT, then future CT at 18-24 months (from today's scan) is considered optional for low-risk patients, but is recommended for high-risk patients. This recommendation follows the consensus statement: Guidelines for Management of Incidental Pulmonary Nodules Detected on CT Images: From the Fleischner Society 2017; Radiology 2017; 284:228-243. 2. Colonic diverticular disease without CT evidence of active inflammation. 3. Biliary ductal dilatation without visible choledocholithiasis or obstructing mass. The gallbladder is surgically absent. Findings may be normal given patient age and prior cholecystectomy status. Recommend correlation with serum LFTs and bilirubin.     Electronically Signed   By: Wilkie Lent M.D.   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  She is still having occasional episodes of dizziness with standing up.  She was having chest discomfort which appears to have resolved since her stenting procedure.  I have personally reviewed her 2D echocardiogram, cardiac catheterization, and CTA studies.  Her echocardiogram shows a severely calcified trileaflet aortic valve with a mean gradient of 34 mmHg and a valve area by VTI of 0.92 cm.  Left ventricular ejection fraction is 55%.  Her catheterization shows diffuse coronary calcification with a high-grade mid RCA stenosis that was successfully treated with PCI/DES.  She had elevated filling pressures at the time of her catheterization.  I agree that aortic valve replacement is indicated in this patient for relief of her symptoms and to prevent progressive left ventricular dysfunction.  Given her age and comorbid risk  factors I think she would have significantly  increased risk for open surgical aortic valve replacement.  I think the best option for treating her would be transcatheter aortic valve replacement.  Her gated cardiac CTA shows anatomy suitable for TAVR using a 26 mm SAPIEN 3 valve possibly removing some volume since she has a small right sinus and smaller STJ of 24.5 mm.  Her abdominal and pelvic CTA shows adequate pelvic vascular anatomy to allow transfemoral insertion.    The patient was counseled at length regarding treatment alternatives for management of severe symptomatic aortic stenosis. The risks and benefits of surgical intervention has been discussed in detail. Long-term prognosis with medical therapy was discussed. Alternative approaches such as conventional surgical aortic valve replacement, transcatheter aortic valve replacement, and palliative medical therapy were compared and contrasted at length. This discussion was placed in the context of the patient's own specific clinical presentation and past medical history. All of her questions have been addressed.   Following the decision to proceed with transcatheter aortic valve replacement, a discussion was held regarding what types of management strategies would be attempted intraoperatively in the event of life-threatening complications, including whether or not the patient would be considered a candidate for the use of cardiopulmonary bypass and/or conversion to open sternotomy for attempted surgical intervention.  I think she would be a candidate for emergent sternotomy to manage any intraoperative complications.  Her risk of bleeding would be increased since her aspirin  and Plavix  cannot be stopped due to recent PCI.  The patient is aware of the fact that transient use of cardiopulmonary bypass may be necessary. The patient has been advised of a variety of complications that might develop including but not limited to risks of death, stroke, paravalvular leak, aortic dissection or other major  vascular complications, aortic annulus rupture, device embolization, cardiac rupture or perforation, mitral regurgitation, acute myocardial infarction, arrhythmia, heart block or bradycardia requiring permanent pacemaker placement, congestive heart failure, respiratory failure, renal failure, pneumonia, infection, other late complications related to structural valve deterioration or migration, or other complications that might ultimately cause a temporary or permanent loss of functional independence or other long term morbidity. The patient provides full informed consent for the procedure as described and all questions were answered.   She has been enrolled in the PROGRESS CAP TRIAL.     Plan:  She will tentatively be scheduled for transfemoral TAVR using a SAPIEN 3 valve in the PROGRESS CAP TRIAL on 07/09/2023.  I spent 60 minutes performing this consultation and > 50% of this time was spent face to face counseling and coordinating the care of this patient's moderate to severe aortic stenosis.   Dorise LOIS Fellers, MD 06/28/2023 3:55 PM

## 2023-07-02 ENCOUNTER — Other Ambulatory Visit: Payer: Self-pay

## 2023-07-02 DIAGNOSIS — I350 Nonrheumatic aortic (valve) stenosis: Secondary | ICD-10-CM

## 2023-07-05 ENCOUNTER — Encounter (HOSPITAL_COMMUNITY)
Admission: RE | Admit: 2023-07-05 | Discharge: 2023-07-05 | Disposition: A | Discharge status: Home / Self Care | Payer: Medicare Other | Source: Ambulatory Visit | Attending: Internal Medicine | Admitting: Internal Medicine

## 2023-07-05 ENCOUNTER — Other Ambulatory Visit: Payer: Self-pay

## 2023-07-05 ENCOUNTER — Ambulatory Visit (HOSPITAL_COMMUNITY)
Admission: RE | Admit: 2023-07-05 | Discharge: 2023-07-05 | Disposition: A | Discharge status: Home / Self Care | Payer: Medicare Other | Source: Ambulatory Visit | Attending: Internal Medicine | Admitting: Internal Medicine

## 2023-07-05 DIAGNOSIS — I350 Nonrheumatic aortic (valve) stenosis: Secondary | ICD-10-CM | POA: Insufficient documentation

## 2023-07-05 DIAGNOSIS — Z01818 Encounter for other preprocedural examination: Secondary | ICD-10-CM | POA: Insufficient documentation

## 2023-07-05 DIAGNOSIS — I700 Atherosclerosis of aorta: Secondary | ICD-10-CM | POA: Diagnosis not present

## 2023-07-05 LAB — COMPREHENSIVE METABOLIC PANEL
ALT: 17 U/L (ref 0–44)
AST: 19 U/L (ref 15–41)
Albumin: 3.9 g/dL (ref 3.5–5.0)
Alkaline Phosphatase: 53 U/L (ref 38–126)
Anion gap: 10 (ref 5–15)
BUN: 16 mg/dL (ref 8–23)
CO2: 29 mmol/L (ref 22–32)
Calcium: 9.6 mg/dL (ref 8.9–10.3)
Chloride: 101 mmol/L (ref 98–111)
Creatinine, Ser: 1.22 mg/dL — ABNORMAL HIGH (ref 0.44–1.00)
GFR, Estimated: 46 mL/min — ABNORMAL LOW (ref >60)
Glucose, Bld: 99 mg/dL (ref 70–99)
Potassium: 3.8 mmol/L (ref 3.5–5.1)
Sodium: 140 mmol/L (ref 135–145)
Total Bilirubin: 1.1 mg/dL (ref 0.0–1.2)
Total Protein: 6.9 g/dL (ref 6.5–8.1)

## 2023-07-05 LAB — PROTIME-INR
INR: 1 (ref 0.8–1.2)
Prothrombin Time: 13.7 s (ref 11.4–15.2)

## 2023-07-05 LAB — CBC
HCT: 42.5 % (ref 36.0–46.0)
Hemoglobin: 14.3 g/dL (ref 12.0–15.0)
MCH: 31.9 pg (ref 26.0–34.0)
MCHC: 33.6 g/dL (ref 30.0–36.0)
MCV: 94.9 fL (ref 80.0–100.0)
Platelets: 208 10*3/uL (ref 150–400)
RBC: 4.48 MIL/uL (ref 3.87–5.11)
RDW: 13.1 % (ref 11.5–15.5)
WBC: 7.4 10*3/uL (ref 4.0–10.5)
nRBC: 0 % (ref 0.0–0.2)

## 2023-07-05 LAB — URINALYSIS, ROUTINE W REFLEX MICROSCOPIC
Bilirubin Urine: NEGATIVE
Glucose, UA: NEGATIVE mg/dL
Hgb urine dipstick: NEGATIVE
Ketones, ur: NEGATIVE mg/dL
Leukocytes,Ua: NEGATIVE
Nitrite: NEGATIVE
Protein, ur: NEGATIVE mg/dL
Specific Gravity, Urine: 1.024 (ref 1.005–1.030)
pH: 5 (ref 5.0–8.0)

## 2023-07-05 LAB — TYPE AND SCREEN
ABO/RH(D): O POS
Antibody Screen: NEGATIVE

## 2023-07-05 LAB — SURGICAL PCR SCREEN
MRSA, PCR: NEGATIVE
Staphylococcus aureus: NEGATIVE

## 2023-07-05 NOTE — Progress Notes (Signed)
Patient signed all consents at PAT lab appointment. CHG soap and instructions were given to patient. CHG surgical prep reviewed with patient and all questions answered.  Patients chart send to anesthesia for review. Pt denies any respiratory illness/infection in the last two months.

## 2023-07-08 MED ORDER — POTASSIUM CHLORIDE 2 MEQ/ML IV SOLN
80.0000 meq | INTRAVENOUS | Status: DC
  Filled 2023-07-08 (×2): qty 40

## 2023-07-08 MED ORDER — CEFAZOLIN SODIUM-DEXTROSE 2-4 GM/100ML-% IV SOLN
2.0000 g | INTRAVENOUS | Status: AC
  Administered 2023-07-09: 2 g via INTRAVENOUS
  Filled 2023-07-08: qty 100

## 2023-07-08 MED ORDER — MAGNESIUM SULFATE 50 % IJ SOLN
40.0000 meq | INTRAMUSCULAR | Status: DC
  Filled 2023-07-08 (×2): qty 9.85

## 2023-07-08 MED ORDER — HEPARIN 30,000 UNITS/1000 ML (OHS) CELLSAVER SOLUTION
Status: DC
  Filled 2023-07-08 (×2): qty 1000

## 2023-07-08 MED ORDER — NOREPINEPHRINE 4 MG/250ML-% IV SOLN
0.0000 ug/min | INTRAVENOUS | Status: DC
  Filled 2023-07-08: qty 250

## 2023-07-08 MED ORDER — DEXMEDETOMIDINE HCL IN NACL 400 MCG/100ML IV SOLN
0.1000 ug/kg/h | INTRAVENOUS | Status: DC
  Filled 2023-07-08: qty 100

## 2023-07-08 NOTE — Anesthesia Preprocedure Evaluation (Signed)
 Anesthesia Evaluation  Patient identified by MRN, date of birth, ID band Patient awake    Reviewed: Allergy & Precautions, H&P , NPO status , Patient's Chart, lab work & pertinent test results  Airway Mallampati: II  TM Distance: >3 FB Neck ROM: Full    Dental no notable dental hx. (+) Teeth Intact, Dental Advisory Given   Pulmonary neg pulmonary ROS   Pulmonary exam normal breath sounds clear to auscultation       Cardiovascular Exercise Tolerance: Good hypertension, Pt. on medications + CAD and + Cardiac Stents  + Valvular Problems/Murmurs AS  Rhythm:Regular Rate:Normal + Systolic murmurs    Neuro/Psych negative neurological ROS  negative psych ROS   GI/Hepatic negative GI ROS, Neg liver ROS,,,  Endo/Other  Hypothyroidism    Renal/GU Renal disease  negative genitourinary   Musculoskeletal   Abdominal   Peds  Hematology negative hematology ROS (+)   Anesthesia Other Findings   Reproductive/Obstetrics negative OB ROS                             Anesthesia Physical Anesthesia Plan  ASA: 4  Anesthesia Plan: MAC   Post-op Pain Management: Tylenol PO (pre-op)*   Induction: Intravenous  PONV Risk Score and Plan: 3 and Propofol infusion, Ondansetron and Treatment may vary due to age or medical condition  Airway Management Planned: Simple Face Mask and Natural Airway  Additional Equipment: Arterial line  Intra-op Plan:   Post-operative Plan:   Informed Consent: I have reviewed the patients History and Physical, chart, labs and discussed the procedure including the risks, benefits and alternatives for the proposed anesthesia with the patient or authorized representative who has indicated his/her understanding and acceptance.     Dental advisory given  Plan Discussed with: CRNA  Anesthesia Plan Comments:        Anesthesia Quick Evaluation

## 2023-07-09 ENCOUNTER — Inpatient Hospital Stay (HOSPITAL_COMMUNITY)
Admission: RE | Admit: 2023-07-09 | Discharge: 2023-07-10 | DRG: 266 | Disposition: A | Discharge status: Home / Self Care | Payer: Medicare Other | Attending: Internal Medicine | Admitting: Internal Medicine

## 2023-07-09 ENCOUNTER — Inpatient Hospital Stay (HOSPITAL_COMMUNITY): Payer: Medicare Other | Admitting: Physician Assistant

## 2023-07-09 ENCOUNTER — Inpatient Hospital Stay (HOSPITAL_COMMUNITY): Payer: Medicare Other | Admitting: Anesthesiology

## 2023-07-09 ENCOUNTER — Encounter (HOSPITAL_COMMUNITY)
Admission: RE | Disposition: A | Discharge status: Home / Self Care | Payer: Self-pay | Source: Home / Self Care | Attending: Internal Medicine

## 2023-07-09 ENCOUNTER — Encounter (HOSPITAL_COMMUNITY): Payer: Self-pay | Admitting: Internal Medicine

## 2023-07-09 ENCOUNTER — Inpatient Hospital Stay (HOSPITAL_COMMUNITY): Payer: Medicare Other

## 2023-07-09 ENCOUNTER — Other Ambulatory Visit: Payer: Self-pay | Admitting: Physician Assistant

## 2023-07-09 DIAGNOSIS — I5030 Unspecified diastolic (congestive) heart failure: Secondary | ICD-10-CM | POA: Diagnosis not present

## 2023-07-09 DIAGNOSIS — L80 Vitiligo: Secondary | ICD-10-CM | POA: Diagnosis present

## 2023-07-09 DIAGNOSIS — Z7902 Long term (current) use of antithrombotics/antiplatelets: Secondary | ICD-10-CM

## 2023-07-09 DIAGNOSIS — Z823 Family history of stroke: Secondary | ICD-10-CM | POA: Diagnosis not present

## 2023-07-09 DIAGNOSIS — I350 Nonrheumatic aortic (valve) stenosis: Secondary | ICD-10-CM | POA: Diagnosis not present

## 2023-07-09 DIAGNOSIS — Z006 Encounter for examination for normal comparison and control in clinical research program: Secondary | ICD-10-CM | POA: Diagnosis not present

## 2023-07-09 DIAGNOSIS — Z90710 Acquired absence of both cervix and uterus: Secondary | ICD-10-CM

## 2023-07-09 DIAGNOSIS — I2510 Atherosclerotic heart disease of native coronary artery without angina pectoris: Secondary | ICD-10-CM | POA: Diagnosis not present

## 2023-07-09 DIAGNOSIS — I5020 Unspecified systolic (congestive) heart failure: Secondary | ICD-10-CM | POA: Diagnosis not present

## 2023-07-09 DIAGNOSIS — N1832 Chronic kidney disease, stage 3b: Secondary | ICD-10-CM | POA: Diagnosis present

## 2023-07-09 DIAGNOSIS — Z833 Family history of diabetes mellitus: Secondary | ICD-10-CM

## 2023-07-09 DIAGNOSIS — I5033 Acute on chronic diastolic (congestive) heart failure: Secondary | ICD-10-CM | POA: Diagnosis not present

## 2023-07-09 DIAGNOSIS — Z85850 Personal history of malignant neoplasm of thyroid: Secondary | ICD-10-CM | POA: Diagnosis not present

## 2023-07-09 DIAGNOSIS — Z6831 Body mass index (BMI) 31.0-31.9, adult: Secondary | ICD-10-CM | POA: Diagnosis not present

## 2023-07-09 DIAGNOSIS — Z7982 Long term (current) use of aspirin: Secondary | ICD-10-CM

## 2023-07-09 DIAGNOSIS — E890 Postprocedural hypothyroidism: Secondary | ICD-10-CM | POA: Diagnosis not present

## 2023-07-09 DIAGNOSIS — M1990 Unspecified osteoarthritis, unspecified site: Secondary | ICD-10-CM | POA: Diagnosis present

## 2023-07-09 DIAGNOSIS — Z952 Presence of prosthetic heart valve: Principal | ICD-10-CM

## 2023-07-09 DIAGNOSIS — Z79890 Hormone replacement therapy (postmenopausal): Secondary | ICD-10-CM | POA: Diagnosis not present

## 2023-07-09 DIAGNOSIS — R911 Solitary pulmonary nodule: Secondary | ICD-10-CM | POA: Diagnosis not present

## 2023-07-09 DIAGNOSIS — I130 Hypertensive heart and chronic kidney disease with heart failure and stage 1 through stage 4 chronic kidney disease, or unspecified chronic kidney disease: Secondary | ICD-10-CM | POA: Diagnosis not present

## 2023-07-09 DIAGNOSIS — I440 Atrioventricular block, first degree: Secondary | ICD-10-CM | POA: Diagnosis not present

## 2023-07-09 DIAGNOSIS — Z9049 Acquired absence of other specified parts of digestive tract: Secondary | ICD-10-CM

## 2023-07-09 DIAGNOSIS — D869 Sarcoidosis, unspecified: Secondary | ICD-10-CM | POA: Diagnosis not present

## 2023-07-09 DIAGNOSIS — Z8249 Family history of ischemic heart disease and other diseases of the circulatory system: Secondary | ICD-10-CM

## 2023-07-09 DIAGNOSIS — Z885 Allergy status to narcotic agent status: Secondary | ICD-10-CM

## 2023-07-09 DIAGNOSIS — Z79899 Other long term (current) drug therapy: Secondary | ICD-10-CM | POA: Diagnosis not present

## 2023-07-09 DIAGNOSIS — E669 Obesity, unspecified: Secondary | ICD-10-CM | POA: Diagnosis present

## 2023-07-09 DIAGNOSIS — E785 Hyperlipidemia, unspecified: Secondary | ICD-10-CM | POA: Diagnosis not present

## 2023-07-09 DIAGNOSIS — Z955 Presence of coronary angioplasty implant and graft: Secondary | ICD-10-CM

## 2023-07-09 DIAGNOSIS — C73 Malignant neoplasm of thyroid gland: Secondary | ICD-10-CM | POA: Diagnosis present

## 2023-07-09 DIAGNOSIS — N183 Chronic kidney disease, stage 3 unspecified: Secondary | ICD-10-CM | POA: Diagnosis present

## 2023-07-09 DIAGNOSIS — Z880 Allergy status to penicillin: Secondary | ICD-10-CM

## 2023-07-09 DIAGNOSIS — I10 Essential (primary) hypertension: Secondary | ICD-10-CM | POA: Diagnosis present

## 2023-07-09 DIAGNOSIS — Z888 Allergy status to other drugs, medicaments and biological substances status: Secondary | ICD-10-CM

## 2023-07-09 HISTORY — DX: Obesity, unspecified: E66.9

## 2023-07-09 HISTORY — DX: Unspecified diastolic (congestive) heart failure: I50.30

## 2023-07-09 HISTORY — PX: INTRAOPERATIVE TRANSTHORACIC ECHOCARDIOGRAM: SHX6523

## 2023-07-09 HISTORY — DX: Chronic kidney disease, stage 3b: N18.32

## 2023-07-09 LAB — ECHOCARDIOGRAM LIMITED
AR max vel: 3.98 cm2
AV Area VTI: 3.91 cm2
AV Area mean vel: 3.99 cm2
AV Mean grad: 5 mm[Hg]
AV Peak grad: 8 mm[Hg]
Ao pk vel: 1.41 m/s
S' Lateral: 3 cm

## 2023-07-09 LAB — POCT I-STAT, CHEM 8
BUN: 23 mg/dL (ref 8–23)
Calcium, Ion: 1.1 mmol/L — ABNORMAL LOW (ref 1.15–1.40)
Chloride: 103 mmol/L (ref 98–111)
Creatinine, Ser: 1.2 mg/dL — ABNORMAL HIGH (ref 0.44–1.00)
Glucose, Bld: 110 mg/dL — ABNORMAL HIGH (ref 70–99)
HCT: 34 % — ABNORMAL LOW (ref 36.0–46.0)
Hemoglobin: 11.6 g/dL — ABNORMAL LOW (ref 12.0–15.0)
Potassium: 3.8 mmol/L (ref 3.5–5.1)
Sodium: 141 mmol/L (ref 135–145)
TCO2: 27 mmol/L (ref 22–32)

## 2023-07-09 LAB — ABO/RH: ABO/RH(D): O POS

## 2023-07-09 SURGERY — TRANSCATHETER AORTIC VALVE REPLACEMENT, TRANSFEMORAL (CATHLAB)
Anesthesia: Monitor Anesthesia Care

## 2023-07-09 MED ORDER — HEPARIN (PORCINE) IN NACL 1000-0.9 UT/500ML-% IV SOLN
INTRAVENOUS | Status: DC | PRN
  Administered 2023-07-09: 1000 mL

## 2023-07-09 MED ORDER — ESTRADIOL 0.5 MG PO TABS
1.0000 mg | ORAL_TABLET | Freq: Every day | ORAL | Status: DC
  Administered 2023-07-09 – 2023-07-10 (×2): 1 mg via ORAL
  Filled 2023-07-09 (×2): qty 2

## 2023-07-09 MED ORDER — ACETAMINOPHEN 500 MG PO TABS
1000.0000 mg | ORAL_TABLET | Freq: Once | ORAL | Status: AC
  Administered 2023-07-09: 1000 mg via ORAL
  Filled 2023-07-09: qty 2

## 2023-07-09 MED ORDER — EZETIMIBE 10 MG PO TABS
10.0000 mg | ORAL_TABLET | Freq: Every day | ORAL | Status: DC
  Administered 2023-07-09 – 2023-07-10 (×2): 10 mg via ORAL
  Filled 2023-07-09 (×2): qty 1

## 2023-07-09 MED ORDER — CEFAZOLIN SODIUM-DEXTROSE 2-4 GM/100ML-% IV SOLN
2.0000 g | Freq: Three times a day (TID) | INTRAVENOUS | Status: AC
  Administered 2023-07-09 (×2): 2 g via INTRAVENOUS
  Filled 2023-07-09 (×2): qty 100

## 2023-07-09 MED ORDER — SODIUM CHLORIDE 0.9 % IV SOLN: INTRAVENOUS | Status: AC

## 2023-07-09 MED ORDER — POTASSIUM CHLORIDE CRYS ER 10 MEQ PO TBCR
10.0000 meq | EXTENDED_RELEASE_TABLET | Freq: Once | ORAL | Status: AC
  Administered 2023-07-09: 10 meq via ORAL
  Filled 2023-07-09: qty 1

## 2023-07-09 MED ORDER — LISINOPRIL 20 MG PO TABS
20.0000 mg | ORAL_TABLET | Freq: Every day | ORAL | Status: DC
  Administered 2023-07-09 – 2023-07-10 (×2): 20 mg via ORAL
  Filled 2023-07-09 (×2): qty 1

## 2023-07-09 MED ORDER — PHENYLEPHRINE HCL-NACL 20-0.9 MG/250ML-% IV SOLN
INTRAVENOUS | Status: AC
  Filled 2023-07-09: qty 1000

## 2023-07-09 MED ORDER — SODIUM CHLORIDE 0.9% FLUSH: 3.0000 mL | INTRAVENOUS | Status: DC | PRN

## 2023-07-09 MED ORDER — LIDOCAINE HCL (PF) 1 % IJ SOLN
INTRAMUSCULAR | Status: AC
  Filled 2023-07-09: qty 30

## 2023-07-09 MED ORDER — FENTANYL CITRATE (PF) 100 MCG/2ML IJ SOLN
INTRAMUSCULAR | Status: AC
  Filled 2023-07-09: qty 2

## 2023-07-09 MED ORDER — SODIUM CHLORIDE 0.9 % IV SOLN: INTRAVENOUS | Status: DC

## 2023-07-09 MED ORDER — LORAZEPAM 0.5 MG PO TABS: 0.2500 mg | ORAL_TABLET | Freq: Three times a day (TID) | ORAL | Status: DC | PRN

## 2023-07-09 MED ORDER — LACTATED RINGERS IV SOLN: INTRAVENOUS | Status: DC

## 2023-07-09 MED ORDER — CHLORHEXIDINE GLUCONATE 4 % EX SOLN: 60.0000 mL | Freq: Once | CUTANEOUS | Status: DC

## 2023-07-09 MED ORDER — HEPARIN SODIUM (PORCINE) 1000 UNIT/ML IJ SOLN
INTRAMUSCULAR | Status: DC | PRN
  Administered 2023-07-09: 13000 [IU] via INTRAVENOUS

## 2023-07-09 MED ORDER — CHLORHEXIDINE GLUCONATE 0.12 % MT SOLN
15.0000 mL | Freq: Once | OROMUCOSAL | Status: AC
  Administered 2023-07-09: 15 mL via OROMUCOSAL
  Filled 2023-07-09: qty 15

## 2023-07-09 MED ORDER — FENTANYL CITRATE (PF) 100 MCG/2ML IJ SOLN
INTRAMUSCULAR | Status: DC | PRN
  Administered 2023-07-09 (×3): 25 ug via INTRAVENOUS

## 2023-07-09 MED ORDER — NITROGLYCERIN IN D5W 200-5 MCG/ML-% IV SOLN: 0.0000 ug/min | INTRAVENOUS | Status: DC

## 2023-07-09 MED ORDER — IOHEXOL 350 MG/ML SOLN
INTRAVENOUS | Status: DC | PRN
Start: 2023-07-09 — End: 2023-07-09
  Administered 2023-07-09: 75 mL

## 2023-07-09 MED ORDER — DEXMEDETOMIDINE HCL IN NACL 400 MCG/100ML IV SOLN
INTRAVENOUS | Status: AC
  Filled 2023-07-09: qty 200

## 2023-07-09 MED ORDER — PROPOFOL 500 MG/50ML IV EMUL
INTRAVENOUS | Status: DC | PRN
  Administered 2023-07-09: 30 mg via INTRAVENOUS
  Administered 2023-07-09: 125 ug/kg/min via INTRAVENOUS

## 2023-07-09 MED ORDER — FUROSEMIDE 10 MG/ML IJ SOLN
20.0000 mg | Freq: Once | INTRAMUSCULAR | Status: AC
  Administered 2023-07-09: 20 mg via INTRAVENOUS
  Filled 2023-07-09: qty 2

## 2023-07-09 MED ORDER — ONDANSETRON HCL 4 MG/2ML IJ SOLN: 4.0000 mg | Freq: Four times a day (QID) | INTRAMUSCULAR | Status: DC | PRN

## 2023-07-09 MED ORDER — SODIUM CHLORIDE 0.9 % IV SOLN: INTRAVENOUS | Status: DC | PRN

## 2023-07-09 MED ORDER — ASPIRIN 81 MG PO TBEC
81.0000 mg | DELAYED_RELEASE_TABLET | Freq: Every day | ORAL | Status: DC
  Administered 2023-07-09 – 2023-07-10 (×2): 81 mg via ORAL
  Filled 2023-07-09 (×2): qty 1

## 2023-07-09 MED ORDER — CLEVIDIPINE BUTYRATE 0.5 MG/ML IV EMUL
INTRAVENOUS | Status: AC
Start: 2023-07-09 — End: 2023-07-09
  Filled 2023-07-09: qty 100

## 2023-07-09 MED ORDER — LEVOTHYROXINE SODIUM 25 MCG PO TABS
137.0000 ug | ORAL_TABLET | Freq: Every day | ORAL | Status: DC
  Administered 2023-07-10: 137 ug via ORAL
  Filled 2023-07-09: qty 1

## 2023-07-09 MED ORDER — HEPARIN SODIUM (PORCINE) 1000 UNIT/ML IJ SOLN
INTRAMUSCULAR | Status: AC
  Filled 2023-07-09: qty 60

## 2023-07-09 MED ORDER — PROTAMINE SULFATE 10 MG/ML IV SOLN
INTRAVENOUS | Status: AC
  Filled 2023-07-09: qty 5

## 2023-07-09 MED ORDER — OXYCODONE HCL 5 MG PO TABS: 5.0000 mg | ORAL_TABLET | ORAL | Status: DC | PRN

## 2023-07-09 MED ORDER — CLOPIDOGREL BISULFATE 75 MG PO TABS
75.0000 mg | ORAL_TABLET | Freq: Every day | ORAL | Status: DC
  Administered 2023-07-09 – 2023-07-10 (×2): 75 mg via ORAL
  Filled 2023-07-09 (×2): qty 1

## 2023-07-09 MED ORDER — PROTAMINE SULFATE 10 MG/ML IV SOLN
INTRAVENOUS | Status: DC | PRN
  Administered 2023-07-09: 40 mg via INTRAVENOUS
  Administered 2023-07-09 (×2): 10 mg via INTRAVENOUS
  Administered 2023-07-09: 30 mg via INTRAVENOUS
  Administered 2023-07-09: 10 mg via INTRAVENOUS

## 2023-07-09 MED ORDER — DIPHENHYDRAMINE HCL 25 MG PO CAPS
50.0000 mg | ORAL_CAPSULE | Freq: Every day | ORAL | Status: DC
  Administered 2023-07-09: 50 mg via ORAL
  Filled 2023-07-09 (×2): qty 2

## 2023-07-09 MED ORDER — SODIUM CHLORIDE 0.9 % IV SOLN: 250.0000 mL | INTRAVENOUS | Status: DC | PRN

## 2023-07-09 MED ORDER — ACETAMINOPHEN 650 MG RE SUPP: 650.0000 mg | Freq: Four times a day (QID) | RECTAL | Status: DC | PRN

## 2023-07-09 MED ORDER — CHLORHEXIDINE GLUCONATE 4 % EX SOLN: 30.0000 mL | CUTANEOUS | Status: DC

## 2023-07-09 MED ORDER — LIDOCAINE HCL (PF) 1 % IJ SOLN
INTRAMUSCULAR | Status: DC | PRN
  Administered 2023-07-09: 10 mL

## 2023-07-09 MED ORDER — ESMOLOL HCL 100 MG/10ML IV SOLN
INTRAVENOUS | Status: DC | PRN
  Administered 2023-07-09: 20 mg via INTRAVENOUS

## 2023-07-09 MED ORDER — ACETAMINOPHEN 325 MG PO TABS
650.0000 mg | ORAL_TABLET | Freq: Four times a day (QID) | ORAL | Status: DC | PRN
Start: 2023-07-09 — End: 2023-07-10

## 2023-07-09 MED ORDER — NOREPINEPHRINE 4 MG/250ML-% IV SOLN
INTRAVENOUS | Status: DC | PRN
  Administered 2023-07-09: 2 ug/min via INTRAVENOUS

## 2023-07-09 MED ORDER — SODIUM CHLORIDE 0.9% FLUSH
3.0000 mL | Freq: Two times a day (BID) | INTRAVENOUS | Status: DC
  Administered 2023-07-09 – 2023-07-10 (×2): 3 mL via INTRAVENOUS

## 2023-07-09 SURGICAL SUPPLY — 32 items
BAG SNAP BAND KOVER 36X36 (MISCELLANEOUS) ×2 IMPLANT
CABLE SURGICAL S-101-97-12 (CABLE) IMPLANT
CATH 26 ULTRA DELIVERY (CATHETERS) IMPLANT
CATH DIAG 6FR PIGTAIL ANGLED (CATHETERS) IMPLANT
CATH INFINITI 5FR ANG PIGTAIL (CATHETERS) IMPLANT
CATH INFINITI 6F AL1 (CATHETERS) IMPLANT
CLOSURE MYNX CONTROL 6F/7F (Vascular Products) IMPLANT
CLOSURE PERCLOSE PROSTYLE (VASCULAR PRODUCTS) IMPLANT
CRIMPER (MISCELLANEOUS) IMPLANT
DEVICE INFLATION ATRION QL2530 (MISCELLANEOUS) IMPLANT
KIT SINGLE USE MANIFOLD (KITS) IMPLANT
KIT SYRINGE INJ CVI SPIKEX1 (MISCELLANEOUS) IMPLANT
PACK CARDIAC CATHETERIZATION (CUSTOM PROCEDURE TRAY) ×1 IMPLANT
SET ATX-X65L (MISCELLANEOUS) IMPLANT
SHEATH 14X36 EDWARDS (SHEATH) IMPLANT
SHEATH INTRODUCER SET 20-26 (SHEATH) IMPLANT
SHEATH PINNACLE 6F 10CM (SHEATH) IMPLANT
SHEATH PINNACLE 8F 10CM (SHEATH) IMPLANT
SHEATH PROBE COVER 6X72 (BAG) IMPLANT
STOPCOCK MORSE 400PSI 3WAY (MISCELLANEOUS) ×2 IMPLANT
SYR CONTROL 10ML ANGIOGRAPHIC (SYRINGE) IMPLANT
TUBING ART PRESS 72 MALE/FEM (TUBING) IMPLANT
TUBING CIL FLEX 10 FLL-RA (TUBING) IMPLANT
VALV SAP RESI3 ULT 26 RESEARCH (Valve) ×1 IMPLANT
VALVE SAP RESI3 ULT 26 RESERCH (Valve) IMPLANT
VALVE SAPIEN3 26 RESEARCH (Valve) ×1 IMPLANT
WIRE AMPLATZ SS-J .035X180CM (WIRE) IMPLANT
WIRE EMERALD 3MM-J .035X150CM (WIRE) IMPLANT
WIRE EMERALD 3MM-J .035X260CM (WIRE) IMPLANT
WIRE EMERALD ST .035X260CM (WIRE) IMPLANT
WIRE MICRO SET SILHO 5FR 7 (SHEATH) IMPLANT
WIRE SAFARI SM CURVE 275 (WIRE) IMPLANT

## 2023-07-09 NOTE — Interval H&P Note (Signed)
 History and Physical Interval Note:  07/09/2023 7:04 AM  Samantha Wood  has presented today for surgery, with the diagnosis of Moderate Aortic Stenosis.  The various methods of treatment have been discussed with the patient and family. After consideration of risks, benefits and other options for treatment, the patient has consented to  Procedure(s): Transcatheter Aortic Valve Replacement, Transfemoral (N/A) INTRAOPERATIVE TRANSTHORACIC ECHOCARDIOGRAM (N/A) as a surgical intervention.  The patient's history has been reviewed, patient examined, no change in status, stable for surgery.  I have reviewed the patient's chart and labs.  Questions were answered to the patient's satisfaction.     Alleen Borne

## 2023-07-09 NOTE — Op Note (Signed)
 [facility]   MULTIDISCIPLINARY HEART VALVE TEAM   TAVR OPERATIVE NOTE   Date of Procedure:  07/09/2023  Preoperative Diagnosis: Moderate Aortic Stenosis   Postoperative Diagnosis: Same   Procedure:   Transcatheter Aortic Valve Replacement - Percutaneous Right Transfemoral Approach  Edwards Sapien 3 Ultra Resilia THV (size 26 mm, model # 9755RSL, serial # [id number])   Co-Surgeons:  Alleen Borne, MD and Alverda Skeans, MD   Anesthesiologist:  Marguerita Merles, MD  Echocardiographer:  Riley Lam, MD  Pre-operative Echo Findings: Moderate aortic stenosis Normal left ventricular systolic function  Post-operative Echo Findings: No paravalvular leak Normal left ventricular systolic function   BRIEF CLINICAL NOTE AND INDICATIONS FOR SURGERY  This 78 year old woman has stage D, symptomatic moderate aortic stenosis with NYHA class II symptoms of exertional fatigue and shortness of breath consistent with chronic diastolic congestive heart failure.  She is still having occasional episodes of dizziness with standing up.  She was having chest discomfort which appears to have resolved since her stenting procedure.  I have personally reviewed her 2D echocardiogram, cardiac catheterization, and CTA studies.  Her echocardiogram shows a severely calcified trileaflet aortic valve with a mean gradient of 34 mmHg and a valve area by VTI of 0.92 cm.  Left ventricular ejection fraction is 55%.  Her catheterization shows diffuse coronary calcification with a high-grade mid RCA stenosis that was successfully treated with PCI/DES.  She had elevated filling pressures at the time of her catheterization.  I agree that aortic valve replacement is indicated in this patient for relief of her symptoms and to prevent progressive left ventricular dysfunction.  Given her age and comorbid risk factors I think she would have significantly increased risk for open surgical aortic valve  replacement.  I think the best option for treating her would be transcatheter aortic valve replacement.  Her gated cardiac CTA shows anatomy suitable for TAVR using a 26 mm SAPIEN 3 valve possibly removing some volume since she has a small right sinus and smaller STJ of 24.5 mm.  Her abdominal and pelvic CTA shows adequate pelvic vascular anatomy to allow transfemoral insertion.     The patient was counseled at length regarding treatment alternatives for management of severe symptomatic aortic stenosis. The risks and benefits of surgical intervention has been discussed in detail. Long-term prognosis with medical therapy was discussed. Alternative approaches such as conventional surgical aortic valve replacement, transcatheter aortic valve replacement, and palliative medical therapy were compared and contrasted at length. This discussion was placed in the context of the patient's own specific clinical presentation and past medical history. All of her questions have been addressed.    Following the decision to proceed with transcatheter aortic valve replacement, a discussion was held regarding what types of management strategies would be attempted intraoperatively in the event of life-threatening complications, including whether or not the patient would be considered a candidate for the use of cardiopulmonary bypass and/or conversion to open sternotomy for attempted surgical intervention.  I think she would be a candidate for emergent sternotomy to manage any intraoperative complications.  Her risk of bleeding would be increased since her aspirin and Plavix cannot be stopped due to recent PCI.  The patient is aware of the fact that transient use of cardiopulmonary bypass may be necessary. The patient has been advised of a variety of complications that might develop including but not limited to risks of death, stroke, paravalvular leak, aortic dissection or other major vascular complications, aortic annulus  rupture,  device embolization, cardiac rupture or perforation, mitral regurgitation, acute myocardial infarction, arrhythmia, heart block or bradycardia requiring permanent pacemaker placement, congestive heart failure, respiratory failure, renal failure, pneumonia, infection, other late complications related to structural valve deterioration or migration, or other complications that might ultimately cause a temporary or permanent loss of functional independence or other long term morbidity. The patient provides full informed consent for the procedure as described and all questions were answered.   She has been enrolled in the PROGRESS CAP TRIAL.       DETAILS OF THE OPERATIVE PROCEDURE  PREPARATION:    The patient was brought to the operating room on the above mentioned date and appropriate monitoring was established by the anesthesia team. The patient was placed in the supine position on the operating table.  Intravenous antibiotics were administered. The patient was monitored closely throughout the procedure under conscious sedation.   Baseline transthoracic echocardiogram was performed. The patient's abdomen and both groins were prepped and draped in a sterile manner. A time out procedure was performed.   PERIPHERAL ACCESS:    Using the modified Seldinger technique, femoral arterial and venous access was obtained with placement of 6 Fr sheaths on the left and right sides respectively.  A pigtail diagnostic catheter was passed through the left arterial sheath under fluoroscopic guidance into the aortic root.  Aortic root angiography was performed in order to determine the optimal angiographic angle for valve deployment.   TRANSFEMORAL ACCESS:   Percutaneous transfemoral access and sheath placement was performed using ultrasound guidance.  The right common femoral artery was cannulated using a micropuncture needle and appropriate location was verified using hand injection angiogram.  A pair  of Abbott Perclose percutaneous closure devices were placed and a 6 French sheath replaced into the femoral artery.  The patient was heparinized systemically and ACT verified > 250 seconds.    A 14 Fr transfemoral E-sheath was introduced into the right common femoral artery after progressively dilating over an Amplatz superstiff wire. A 6 Fr pigtail catheter was used to direct a straight-tip exchange length wire across the native aortic valve into the left ventricle.  Simultaneous LV and Ao pressures were recorded.  The pigtail catheter was exchanged for a Safari wire in the LV apex. Direct LV pacing threshold was tested through the [facility] wire and was satisfactory.  BALLOON AORTIC VALVULOPLASTY:    Not performed.   TRANSCATHETER HEART VALVE DEPLOYMENT:   An Edwards Sapien 3 Ultra transcatheter heart valve (size 26 mm) was prepared and crimped per manufacturer's guidelines, and the proper orientation of the valve is confirmed on the [employer] delivery system. The valve was advanced through the introducer sheath using normal technique until in an appropriate position in the abdominal aorta beyond the sheath tip. The balloon was then retracted and using the fine-tuning wheel was centered on the valve. The valve was then advanced across the aortic arch using appropriate flexion of the catheter. The valve was carefully positioned across the aortic valve annulus. The Commander catheter was retracted using normal technique. Once final position of the valve has been confirmed by angiographic assessment, the valve is deployed during rapid ventricular pacing to maintain systolic blood pressure < 50 mmHg and pulse pressure < 10 mmHg. The balloon inflation is held for >3 seconds after reaching full deployment volume. Once the balloon has fully deflated the balloon is retracted into the ascending aorta and valve function is assessed using echocardiography. There is felt to be no paravalvular leak and  no  central aortic insufficiency. Post-procedure gradient was acceptable. The patient's hemodynamic recovery following valve deployment is good.  The deployment balloon and guidewire are both removed.    PROCEDURE COMPLETION:   The sheath was removed and femoral artery closure performed.  Protamine was administered once femoral arterial repair was complete. The temporary pacemaker, pigtail catheter and femoral sheaths were removed with manual pressure used for venous hemostasis.  A Mynx femoral closure device was utilized following removal of the diagnostic sheath in the left femoral artery.  The patient tolerated the procedure well and is transported to the cath lab recovery area in stable condition. There were no immediate intraoperative complications. All sponge instrument and needle counts are verified correct at completion of the operation.   No blood products were administered during the operation.  The patient received a total of 75 mL of intravenous contrast during the procedure.   Alleen Borne, MD 07/09/2023 9:43 AM

## 2023-07-09 NOTE — Transfer of Care (Signed)
 Immediate Anesthesia Transfer of Care Note  Patient: Samantha Wood  Procedure(s) Performed: Transcatheter Aortic Valve Replacement, Transfemoral INTRAOPERATIVE TRANSTHORACIC ECHOCARDIOGRAM  Patient Location: PACU and Cath Lab  Anesthesia Type:MAC  Level of Consciousness: awake, patient cooperative, and responds to stimulation  Airway & Oxygen Therapy: Patient Spontanous Breathing  Post-op Assessment: Report given to RN and Post -op Vital signs reviewed and stable  Post vital signs: Reviewed and stable  Last Vitals:  Vitals Value Taken Time  BP 137/71 07/09/23 0930  Temp    Pulse 61 07/09/23 0931  Resp 11 07/09/23 0931  SpO2 98 % 07/09/23 0931  Vitals shown include unfiled device data.  Last Pain:  Vitals:   07/09/23 0855  TempSrc:   PainSc: Asleep         Complications: There were no known notable events for this encounter.

## 2023-07-09 NOTE — Progress Notes (Signed)
  Echocardiogram 2D Echocardiogram has been performed.  Janalyn Harder 07/09/2023, 9:08 AM

## 2023-07-09 NOTE — H&P (Signed)
 [address]       [name] [address]             [phone]      Cardiothoracic Surgery Admission History and Physical   PCP is Richardean Chimera, MD Referring Provider is Alverda Skeans, MD Primary Cardiologist is Assar, Rayetta Pigg, MD   Reason for admission:  moderate to severe aortic stenosis   HPI:   The patient is a 78 year old woman with a history of hypertension, hyperlipidemia, stage IIIb chronic kidney disease, osteoarthritis, coronary artery disease status post DES to the RCA on 05/17/2023, and moderate to severe aortic stenosis who was referred for consideration of TAVR.  She was seen initially by Dr. Lynnette Caffey in June 2024 concerning her aortic stenosis.  Echocardiogram at that time showed moderate to severe aortic stenosis with normal left ventricular systolic function.  She was reporting increasing exertional chest discomfort, fatigue, and dizziness.  She decided that she did not want to proceed with further workup at that time.  She was seen back in December 2024 with a transthoracic echo showing an aortic valve mean gradient of 32 mmHg with a peak velocity of 3.5 m/s and aortic valve area of 0.91 cm with preserved left ventricular systolic function.  She reported being more symptomatic with exertional fatigue, shortness of breath, and chest tightness with exertion and at rest.  She also reported some dizziness when standing up and felt like she was going to lose her balance.  She subsequently underwent cardiac catheterization on 05/17/2023 which showed a 90% mid RCA stenosis that was successfully stented.  She was started on dual antiplatelet therapy.  Her PA pressure was 50/25 with a mean of 34 and a wedge pressure of 27 with V waves up to 34 mmHg.  She was started on Lasix 20 mg daily due to elevated filling pressures.  She had her electrolytes checked on 06/03/2023 and this showed an increase in her creatinine to 1.56 felt to be due to dehydration.  Her Lasix  was stopped.  She has continue to monitor her weight which has been stable.  She has had no further chest discomfort since her stent was placed.   She is widowed and lives with her friend Samantha Wood.  She had a son who had coronary bypass surgery and subsequently died last year.       Past Medical History:  Diagnosis Date   Aortic stenosis, moderate     Arthritis     Follicular thyroid carcinoma (HCC)     History of chicken pox     Hypertension     Hypocalcemia     Sarcoidosis     Vitiligo                 Past Surgical History:  Procedure Laterality Date   ABDOMINAL HYSTERECTOMY   10/1984   CHOLECYSTECTOMY   07/1992    three weeks after gallstone removal   CORONARY STENT INTERVENTION N/A 05/17/2023    Procedure: CORONARY STENT INTERVENTION;  Surgeon: Orbie Pyo, MD;  Location: [facility];  Service: Cardiovascular;  Laterality: N/A;   Gallstone removal from Bile Duct   07/1992   RIGHT HEART CATH AND CORONARY ANGIOGRAPHY N/A 05/17/2023    Procedure: RIGHT HEART CATH AND CORONARY ANGIOGRAPHY;  Surgeon: Orbie Pyo, MD;  Location: [facility];  Service: Cardiovascular;  Laterality: N/A;   TOTAL THYROIDECTOMY   12/2011   TUBAL LIGATION   10/1980  TUMOR EXCISION   05/1972    left side of thyroid               Family History  Problem Relation Age of Onset   Heart disease Mother     Stroke Mother     Hypertension Mother     Diabetes Mother     Heart disease Father     Diabetes Maternal Grandmother            Social History         Socioeconomic History   Marital status: Widowed      Spouse name: Not on file   Number of children: 1   Years of education: 14   Highest education level: Not on file  Occupational History   Occupation: Retired  Tobacco Use   Smoking status: Never   Smokeless tobacco: Never  Substance and Sexual Activity   Alcohol use: No   Drug use: Not on file   Sexual activity: Not on file  Other Topics Concern   Not on  file  Social History Narrative    Regular exercise-yes    Caffeine Use-no    Social Drivers of Health    Financial Resource Strain: Not on file  Food Insecurity: Not on file  Transportation Needs: Not on file  Physical Activity: Not on file  Stress: Not on file  Social Connections: Not on file  Intimate Partner Violence: Not on file             Prior to Admission medications   Medication Sig Start Date End Date Taking? Authorizing Provider  acetaminophen (TYLENOL) 650 MG CR tablet Take 1,300 mg by mouth 2 (two) times daily.     Yes [provider]  aspirin EC 81 MG tablet Take 1 tablet (81 mg total) by mouth daily. Swallow whole. 05/17/23   Yes Orbie Pyo, MD  calcitRIOL (ROCALTROL) 0.25 MCG capsule Take 0.25 mcg by mouth 2 (two) times daily.  01/23/12   Yes [provider]  clopidogrel (PLAVIX) 75 MG tablet Take 1 tablet (75 mg total) by mouth daily. 05/17/23   Yes Orbie Pyo, MD  Cranberry-Vitamin C-Probiotic (AZO CRANBERRY PO) Take 1 tablet by mouth 2 (two) times daily.     Yes [provider]  DIPHENHYDRAMINE HCL PO Take 2 tablets by mouth at bedtime.     Yes [provider]  estradiol (ESTRACE) 1 MG tablet Take 1 mg by mouth daily.  01/04/12   Yes [provider]  ezetimibe (ZETIA) 10 MG tablet Take 1 tablet (10 mg total) by mouth daily. 06/03/23 09/01/23 Yes Weaver, Scott T, PA-C  furosemide (LASIX) 20 MG tablet Take 1 tablet (20 mg total) by mouth daily as needed for fluid (WEIGHT GAIN 3 LBS IN 1 DAY). 06/03/23 09/01/23 Yes [name] T, PA-C  levothyroxine (SYNTHROID) 137 MCG tablet Take 137 mcg by mouth daily before breakfast. 04/29/23   Yes [provider]  lisinopril-hydrochlorothiazide (PRINZIDE,ZESTORETIC) 20-12.5 MG per tablet Take 1 tablet by mouth daily.  02/02/12   Yes [provider]  LORazepam (ATIVAN) 0.5 MG tablet Take 0.125 mg by mouth every 8 (eight) hours as needed for anxiety. 12/14/11   Yes  [provider]  Multiple Minerals-Vitamins (CALCIUM-MAGNESIUM-ZINC-D3 PO) Take 2 tablets by mouth 2 (two) times daily.     Yes [provider]  nitroGLYCERIN (NITROSTAT) 0.4 MG SL tablet Place 1 tablet (0.4 mg total) under the tongue every 5 (five) minutes as  needed for chest pain. 05/17/23 05/16/24 Yes Yvonna Alanis L, PA-C  trolamine salicylate (BLUE-EMU HEMP) 10 % cream Apply 1 Application topically at bedtime.     Yes [provider]            Current Outpatient Medications  Medication Sig Dispense Refill   acetaminophen (TYLENOL) 650 MG CR tablet Take 1,300 mg by mouth 2 (two) times daily.       aspirin EC 81 MG tablet Take 1 tablet (81 mg total) by mouth daily. Swallow whole. 90 tablet 1   calcitRIOL (ROCALTROL) 0.25 MCG capsule Take 0.25 mcg by mouth 2 (two) times daily.        clopidogrel (PLAVIX) 75 MG tablet Take 1 tablet (75 mg total) by mouth daily. 90 tablet 6   Cranberry-Vitamin C-Probiotic (AZO CRANBERRY PO) Take 1 tablet by mouth 2 (two) times daily.       DIPHENHYDRAMINE HCL PO Take 2 tablets by mouth at bedtime.       estradiol (ESTRACE) 1 MG tablet Take 1 mg by mouth daily.        ezetimibe (ZETIA) 10 MG tablet Take 1 tablet (10 mg total) by mouth daily. 90 tablet 3   furosemide (LASIX) 20 MG tablet Take 1 tablet (20 mg total) by mouth daily as needed for fluid (WEIGHT GAIN 3 LBS IN 1 DAY).       levothyroxine (SYNTHROID) 137 MCG tablet Take 137 mcg by mouth daily before breakfast.       lisinopril-hydrochlorothiazide (PRINZIDE,ZESTORETIC) 20-12.5 MG per tablet Take 1 tablet by mouth daily.        LORazepam (ATIVAN) 0.5 MG tablet Take 0.125 mg by mouth every 8 (eight) hours as needed for anxiety.       Multiple Minerals-Vitamins (CALCIUM-MAGNESIUM-ZINC-D3 PO) Take 2 tablets by mouth 2 (two) times daily.       nitroGLYCERIN (NITROSTAT) 0.4 MG SL tablet Place 1 tablet (0.4 mg total) under the tongue every 5 (five) minutes as needed for chest pain. 25  tablet 2   trolamine salicylate (BLUE-EMU HEMP) 10 % cream Apply 1 Application topically at bedtime.          No current facility-administered medications for this visit.        Allergies       Allergies  Allergen Reactions   Amoxicillin Nausea And Vomiting      Headache   Codeine Other (See Comments)      "Bad headache"   Diltiazem Other (See Comments)      "Bad headache"            Review of Systems:               General:                      normal appetite, + decreased energy, no weight gain, no weight loss, no fever             Cardiac:                       no chest pain with exertion, no chest pain at rest, +SOB with moderate exertion, no resting SOB, no PND, no orthopnea, no palpitations, no arrhythmia, no atrial fibrillation, no LE edema, + dizzy spells, no syncope             Respiratory:                 +  exertional shortness of breath, no home oxygen, no productive cough, no dry cough, no bronchitis, no wheezing, no hemoptysis, no asthma, no pain with inspiration or cough, no sleep apnea, no CPAP at night             GI:                               no difficulty swallowing, no reflux, no frequent heartburn, no hiatal hernia, no abdominal pain, no constipation, no diarrhea, no hematochezia, no hematemesis, no melena             GU:                              no dysuria,  no frequency, no urinary tract infection, no hematuria, no kidney stones, +  kidney disease             Vascular:                     no pain suggestive of claudication, no pain in feet, no leg cramps, no varicose veins, no DVT, no non-healing foot ulcer             Neuro:                         no stroke, no TIA's, no seizures, no headaches, no temporary blindness one eye,  no slurred speech, no peripheral neuropathy, no chronic pain, no instability of gait, no memory/cognitive dysfunction             Musculoskeletal:         + arthritis, no joint swelling, no myalgias, no difficulty walking, normal  mobility              Skin:                            no rash, no itching, no skin infections, no pressure sores or ulcerations             Psych:                         no anxiety, no depression, no nervousness, no unusual recent stress             Eyes:                           no blurry vision, no floaters, no recent vision changes, + wears glasses              ENT:                            no hearing loss, no loose or painful teeth, no dentures, last saw dentist few months ago             Hematologic:               no easy bruising, no abnormal bleeding, no clotting disorder, no frequent epistaxis             Endocrine:                   no diabetes, does not check CBG's  at home                            Physical Exam:               BP (!) 158/89 (BP Location: Left Arm)   Pulse 77   Resp 18   Ht 5\' 7"  (1.702 m)   Wt 196 lb (88.9 kg)   SpO2 94% Comment: ra  BMI 30.70 kg/m              General:                      Elderly woman,   well-appearing             HEENT:                       Unremarkable, NCAT, PERLA, EOMI             Neck:                           no JVD, no bruits, no adenopathy              Chest:                          clear to auscultation, symmetrical breath sounds, no wheezes, no rhonchi              CV:                              RRR, 3/6 systolic murmur RSB, no diastolic murmur             Abdomen:                    soft, non-tender, no masses              Extremities:                 warm, well-perfused, pedal pulses palpable,  lower extremity edema             Rectal/GU                   Deferred             Neuro:                         Grossly non-focal and symmetrical throughout             Skin:                            Clean and dry, no rashes, no breakdown   Diagnostic Tests:   ECHOCARDIOGRAM REPORT       Patient Name:   Samantha Wood Date of Exam: 05/03/2023  Medical Rec #:  [id number]     Height:       67.0 in  Accession #:     [id number]    Weight:       198.0 lb  Date of Birth:  November 16, 1945     BSA:          2.014 m  Patient Age:    72 years  BP:           142/70 mmHg  Patient Gender: F             HR:           64 bpm.  Exam Location:  [address]   Procedure: 2D Echo, 3D Echo, Cardiac Doppler, Color Doppler and Strain  Analysis   Indications:   Aortic stenosis I35.0    History:        Patient has prior history of Echocardiogram examinations,  most                 recent 09/26/2022. Aortic Valve Disease; Risk                  Factors:Hypertension. Sarcoidosis.    Sonographer:    Eulah Pont RDCS  Referring Phys: [id number] Orbie Pyo     Sonographer Comments: Global longitudinal strain was attempted.  IMPRESSIONS     1. Left ventricular ejection fraction, by estimation, is 55%. The left  ventricle has normal function. The left ventricle has no regional Somero  motion abnormalities. Left ventricular diastolic parameters are consistent  with Grade I diastolic dysfunction  (impaired relaxation). The average left ventricular global longitudinal  strain is -14.7 %. The global longitudinal strain is abnormal.   2. Right ventricular systolic function is normal. The right ventricular  size is normal. There is normal pulmonary artery systolic pressure. The  estimated right ventricular systolic pressure is 24.3 mmHg.   3. Left atrial size was mildly dilated.   4. The mitral valve is normal in structure. Mild mitral valve  regurgitation. Mild mitral stenosis. The mean mitral valve gradient is 3.0  mmHg.   5. The aortic valve is tricuspid. There is severe calcifcation of the  aortic valve. Aortic valve regurgitation is trivial. Aortic valve mean  gradient measures 34.0 mmHg.   6. The inferior vena cava is normal in size with greater than 50%  respiratory variability, suggesting right atrial pressure of 3 mmHg.   FINDINGS   Left Ventricle: Left ventricular ejection fraction, by estimation, is  55%.  The left ventricle has normal function. The left ventricle has no  regional Cortopassi motion abnormalities. The average left ventricular global  longitudinal strain is -14.7 %. The  global longitudinal strain is abnormal. The left ventricular internal  cavity size was normal in size. There is no left ventricular hypertrophy.  Left ventricular diastolic parameters are consistent with Grade I  diastolic dysfunction (impaired relaxation).   Right Ventricle: The right ventricular size is normal. No increase in  right ventricular Bensman thickness. Right ventricular systolic function is  normal. There is normal pulmonary artery systolic pressure. The tricuspid  regurgitant velocity is 2.31 m/s, and   with an assumed right atrial pressure of 3 mmHg, the estimated right  ventricular systolic pressure is 24.3 mmHg.   Left Atrium: Left atrial size was mildly dilated.   Right Atrium: Right atrial size was normal in size.   Pericardium: There is no evidence of pericardial effusion.   Mitral Valve: The mitral valve is normal in structure. There is moderate  calcification of the mitral valve leaflet(s). Mild mitral annular  calcification. Mild mitral valve regurgitation. Mild mitral valve  stenosis. MV peak gradient, 7.4 mmHg. The mean  mitral valve gradient is 3.0 mmHg.   Tricuspid Valve: The tricuspid valve is normal in structure. Tricuspid  valve regurgitation is mild.   Aortic Valve: The aortic valve is tricuspid. There  is severe calcifcation  of the aortic valve. Aortic valve regurgitation is trivial. Aortic valve  mean gradient measures 34.0 mmHg. Aortic valve peak gradient measures 48.6  mmHg. Aortic valve area, by VTI  measures 0.92 cm.   Pulmonic Valve: The pulmonic valve was normal in structure. Pulmonic valve  regurgitation is not visualized.   Aorta: The aortic root is normal in size and structure.   Venous: The inferior vena cava is normal in size with greater than 50%  respiratory  variability, suggesting right atrial pressure of 3 mmHg.   IAS/Shunts: No atrial level shunt detected by color flow Doppler.     LEFT VENTRICLE  PLAX 2D  LVIDd:         4.36 cm   Diastology  LVIDs:         3.10 cm   LV e' medial:    6.53 cm/s  LV PW:         0.92 cm   LV E/e' medial:  18.8  LV IVS:        1.03 cm   LV e' lateral:   6.00 cm/s  LVOT diam:     1.90 cm   LV E/e' lateral: 20.5  LV SV:         90  LV SV Index:   45        2D Longitudinal Strain  LVOT Area:     2.84 cm  2D Strain GLS Avg:     -14.7 %                             3D Volume EF:                           3D EF:        51 %                           LV EDV:       149 ml                           LV ESV:       74 ml                           LV SV:        75 ml   RIGHT VENTRICLE  RV S prime:     19.30 cm/s  TAPSE (M-mode): 2.2 cm   LEFT ATRIUM             Index        RIGHT ATRIUM          Index  LA diam:        4.40 cm 2.19 cm/m   RA Area:     9.84 cm  LA Vol (A2C):   62.6 ml 31.09 ml/m  RA Volume:   18.90 ml 9.39 ml/m  LA Vol (A4C):   62.6 ml 31.09 ml/m  LA Biplane Vol: 62.7 ml 31.14 ml/m   AORTIC VALVE  AV Area (Vmax):    1.16 cm  AV Area (Vmean):   0.98 cm  AV Area (VTI):     0.92 cm  AV Vmax:           348.50 cm/s  AV Vmean:  275.000 cm/s  AV VTI:            0.977 m  AV Peak Grad:      48.6 mmHg  AV Mean Grad:      34.0 mmHg  LVOT Vmax:         142.00 cm/s  LVOT Vmean:        95.000 cm/s  LVOT VTI:          0.318 m  LVOT/AV VTI ratio: 0.33    AORTA  Ao Root diam: 2.90 cm  Ao Asc diam:  3.20 cm   MITRAL VALVE                TRICUSPID VALVE  MV Area (PHT): 2.50 cm     TR Peak grad:   21.3 mmHg  MV Area VTI:   1.84 cm     TR Vmax:        231.00 cm/s  MV Peak grad:  7.4 mmHg  MV Mean grad:  3.0 mmHg     SHUNTS  MV Vmax:       1.36 m/s     Systemic VTI:  0.32 m  MV Vmean:      83.2 cm/s    Systemic Diam: 1.90 cm  MV E velocity: 123.00 cm/s  MV A velocity: 128.00 cm/s   MV E/A ratio:  0.96   Dalton McleanMD  Electronically signed by Wilfred Lacy  Signature Date/Time: 05/03/2023/12:19:21 PM        Final        Physicians   Panel Physicians Referring Physician Case Authorizing Physician  Orbie Pyo, MD (Primary)        Procedures   CORONARY STENT INTERVENTION  RIGHT HEART CATH AND CORONARY ANGIOGRAPHY    Conclusion       Mid RCA lesion is 90% stenosed.   A stent was successfully placed.   Post intervention, there is a 0% residual stenosis.   1.  High-grade mid right coronary artery lesion treated with 1 drug-eluting stent. 2.  Fick cardiac output of 10.1 L/min and Fick cardiac index of 5.0 L/min/m with the following hemodynamics:            Right atrial pressure mean of 18 mmHg.            RV 48/10 with an end-diastolic pressure of 23 mmHg.            Wedge pressure mean of 27 mmHg with V waves to 34 mmHg.            PA pressure 50/25 with a mean of 34 mmHg            PVR of 0.7 Woods units            PA pulsatility index of 1.4   Recommendation: Dual antiplatelet therapy for 6 months followed by indefinite Plavix monotherapy; start Lasix 20 mg twice daily due to elevated filling pressures and check BMP in 1 week.  Will determine whether patient remains a candidate for PROGRESS CAP research program.   Indications   Angina pectoris (HCC) [I20.9 (ICD-10-CM)]  Nonrheumatic aortic valve stenosis [I35.0 (ICD-10-CM)]    Procedural Details   Technical Details The patient is a 78 year old female with a history of moderate aortic stenosis, mild coronary artery disease on a cardiac catheterization a few years ago, hypertension, hyperlipidemia, and CKD stage III who was seen in the outpatient setting due to chest pain and dyspnea.  She was  enrolled in the progress trial and is referred for coronary angiography and right heart catheterization for preprocedural evaluation prior to aortic valve intervention.  After obtaining consent the  patient brought to the cardiac catheterization laboratory and prepped in the sterile fashion.  Xylocaine was used to anesthetize the right wrist and a 6 French femoral glide sheath was placed.  5000 units heparin and 5 mg verapamil were administered through the sheath. A previously placed antecubital IV was exchanged for a 5 [place] Terumo glide sheath.  5 [place] balloontipped catheter was used for right heart catheterization.  A 6 [place] TIG catheter was used for selective coronary angiography.  After review of the angiogram and considering the patient's chest pain symptoms.  PCI of the mid right coronary artery lesion was pursued.  The patient was loaded with 600 mg of Plavix.  After heparinizing to an ACT of greater than 250, a 6 [place] JR4 guiding catheter was used to cannulate the right coronary artery.  A [place] blue wire was used to cross the lesion.  It was predilated with a 2.0 mm balloon and a 2.5 mm balloon.  This allowed entry and positioning of a 2.5 x 16 mm Synergy XD stent that was postdilated to high pressures with a 2.5 mm  balloon.  The end result was good with no evidence of dissection, perforation, or thrombosis.  A TR band was placed and manual pressure was applied to the antecubital site.  There were no acute complications.   Estimated blood loss <50 mL.   During this procedure medications were administered to achieve and maintain moderate conscious sedation while the patient's heart rate, blood pressure, and oxygen saturation were continuously monitored and I was present face-to-face 100% of this time.    Medications (Filter: Administrations occurring from 1252 to 1410 on 05/17/23) Heparin (Porcine) in NaCl 1000-0.9 UT/500ML-% SOLN (mL)  Total volume: 1,000 mL Date/Time Rate/Dose/Volume Action    05/17/23 1257 500 mL Given    1257 500 mL Given    fentaNYL (SUBLIMAZE) injection (mcg)  Total dose: 50 mcg Date/Time Rate/Dose/Volume Action    05/17/23 1304 25 mcg Given     1336 25 mcg Given    midazolam (VERSED) injection (mg)  Total dose: 2 mg Date/Time Rate/Dose/Volume Action    05/17/23 1304 1 mg Given    1336 1 mg Given    lidocaine (PF) (XYLOCAINE) 1 % injection (mL)  Total volume: 5 mL Date/Time Rate/Dose/Volume Action    05/17/23 1306 5 mL Given    Radial Cocktail/Verapamil only (mL)  Total volume: 10 mL Date/Time Rate/Dose/Volume Action    05/17/23 1309 10 mL Given    heparin sodium (porcine) injection (Units)  Total dose: 9,000 Units Date/Time Rate/Dose/Volume Action    05/17/23 1309 5,000 Units Given    1320 4,000 Units Given    clopidogrel (PLAVIX) tablet (mg)  Total dose: 600 mg Date/Time Rate/Dose/Volume Action    05/17/23 1325 600 mg Given    nitroGLYCERIN 1 mg/10 mL (100 mcg/mL) - IR/CATH LAB (mcg)  Total dose: 150 mcg Date/Time Rate/Dose/Volume Action    05/17/23 1350 150 mcg Given    iohexol (OMNIPAQUE) 350 MG/ML injection (mL)  Total volume: 110 mL Date/Time Rate/Dose/Volume Action    05/17/23 1357 110 mL Given      Sedation Time   Sedation Time Physician-1: 47 minutes 50 seconds Contrast        Administrations occurring from 1252 to 1410 on 05/17/23:  Medication Name Total Dose  iohexol (  OMNIPAQUE) 350 MG/ML injection 110 mL    Radiation/Fluoro   Fluoro time: 12.7 (min) DAP: 35974 (mGycm2) Cumulative Air Kerma: 568 (mGy) Complications   Complications documented before study signed (05/17/2023  2:10 PM)    No complications were associated with this study.  Documented by Lucius Conn, RT - 05/17/2023  1:52 PM      Coronary Findings   Diagnostic Dominance: Co-dominant Right Coronary Artery  Mid RCA lesion is 90% stenosed.    Intervention    Mid RCA lesion  Stent  A stent was successfully placed.  Post-Intervention Lesion Assessment  The intervention was successful. Pre-interventional TIMI flow is 3. Post-intervention TIMI flow is 3.  There is a 0% residual stenosis post intervention.       Coronary Diagrams   Diagnostic Dominance: Co-dominant  Intervention     Implants    Permanent Stent   Synergy Xd 2.50x16 - [id number] - Implanted  Inventory item: SYNERGY XD 2.50X16 Model/Cat number: [id number]  Manufacturer: Norvel Richards Lot number: [id number]  Device identifier: [id number] Device identifier type: GS1  GUDID Information           Request status Successful      Brand name: Lacie Scotts Version/Model: [id number]  Company name: [employer] MRI safety info as of 05/17/23: MR Conditional  Contains dry or latex rubber: No      GMDN P.T. name: Drug-eluting coronary artery stent, non-bioabsorbable-polymer-coated      As of 05/17/2023   Status: Implanted        Syngo Images    Show images for CARDIAC CATHETERIZATION Images on Long Term Storage    Show images for Klump, Bosie Clos A "Darel Hong" Link to Procedure Log   Procedure Log    Hemodynamics   Pressures Phases Resting  Right      RA Mean  mmHg 18    RA A-Wave  mmHg 22    RA V-Wave  mmHg 20  Pulmonary      PA  mmHg 50/25 (34)    PCW Mean  mmHg 27.0    PCW A-Wave  mmHg 29.0    PCW V-Wave  mmHg 34.0    PAPi   1.4    Saturations Phases Resting    PA  % 69    Arterial  % 89    Hemo Data   Flowsheet Row Most Recent Value  Fick Cardiac Output 10.05 L/min  Fick Cardiac Output Index 4.99 (L/min)/BSA  RA A Wave 22 mmHg  RA V Wave 20 mmHg  RA Mean 18 mmHg  RV Systolic Pressure 48 mmHg  RV Diastolic Pressure 10 mmHg  RV EDP 23 mmHg  PA Systolic Pressure 50 mmHg  PA Diastolic Pressure 25 mmHg  PA Mean 34 mmHg  PW A Wave 29 mmHg  PW V Wave 34 mmHg  PW Mean 27 mmHg  AO Systolic Pressure 154 mmHg  AO Diastolic Pressure 74 mmHg  AO Mean 107 mmHg  QP/QS 0.7  TPVR Index 9.73 HRUI  TSVR Index 22.25 HRUI  PVR SVR Ratio 0.11  TPVR/TSVR Ratio 0.44    ADDENDUM REPORT: 06/10/2023 16:15   EXAM: OVER-READ INTERPRETATION  CT CHEST   The following report is an over-read  performed by radiologist Dr. Narda Rutherford of [facility] on 06/10/2023. This over-read does not include interpretation of cardiac or coronary anatomy or pathology. The coronary CTA interpretation by the cardiologist is attached.   COMPARISON:  Concurrent full field of view chest CTA, reported separately.  FINDINGS: Vascular: Aortic atherosclerosis. The included aorta is normal in caliber.   Mediastinum/nodes: No adenopathy or mass.  Decompressed esophagus.   Lungs: No focal airspace disease. 8 mm a subpleural left upper lobe nodule series 6, image 25 4 mm perifissural left upper lobe nodule series 6, image 29, typical of an intrapulmonary lymph node. No pleural fluid. The included airways are patent.   Upper abdomen: No acute or unexpected findings.   Musculoskeletal: There are no acute or suspicious osseous abnormalities.   IMPRESSION: 1.  Aortic Atherosclerosis (ICD10-I70.0). 2. Pleural based 8 mm left upper lobe nodule. Per Fleischner Society guidelines, noncontrast chest CT at 6-12 months is recommended.     Electronically Signed   By: Narda Rutherford M.D.   On: 06/10/2023 16:15    Addended by Courtney Heys, MD on 06/10/2023  4:17 PM    Study Result   Narrative & Impression  CLINICAL DATA:  Severe Aortic Stenosis.   EXAM: Cardiac TAVR CT   TECHNIQUE: A non-contrast, gated CT scan was obtained with axial slices of 3 mm through the heart for aortic valve calcium scoring. A 110 kV retrospective, gated, contrast cardiac scan was obtained. Gantry rotation speed was 250 msecs and collimation was 0.6 mm. Nitroglycerin was not given. The 3D data set was reconstructed in 5% intervals of the 0-95% of the R-R cycle. Systolic and diastolic phases were analyzed on a dedicated workstation using MPR, MIP, and VRT modes. The patient received 100 cc of contrast.   FINDINGS: Image quality: Excellent.   Noise artifact is: Limited.   Valve Morphology:  Tricuspid aortic valve with severely calcified leaflets and severely restricted leaflet motion in systole. Bulky calcification of the NCC.   Aortic Valve Calcium score: 1958   Aortic annular dimension:   Phase assessed: 35%   Annular area: 450 mm2   Annular perimeter: 76.3 mm   Max diameter: 26.6 mm   Min diameter: 21.8 mm   Annular and subannular calcification: None.   Membranous septum length: 4.8 mm   Optimal coplanar projection: LAO 26 CRA 6   Coronary Artery Height above Annulus:   Left Main: 16.0 mm   Right Coronary: 13.3 mm   Sinus of Valsalva Measurements:   Non-coronary: 31 mm   Right-coronary: 30 mm   Left-coronary: 30 mm   Sinus of Valsalva Height:   Non-coronary: 20.7 mm   Right-coronary: 21.7 mm   Left-coronary: 22.4 mm   Sinotubular Junction: 26 mm   Ascending Thoracic Aorta: 31 mm   Coronary Arteries: Normal coronary origin. Right dominance. The study was performed without use of NTG and is insufficient for plaque evaluation. Please refer to recent cardiac catheterization for coronary assessment.   Cardiac Morphology:   Right Atrium: Right atrial size is within normal limits.   Right Ventricle: The right ventricular cavity is within normal limits.   Left Atrium: Left atrial size is dilated with no left atrial appendage filling defect.   Left Ventricle: The ventricular cavity size is within normal limits.   Pulmonary arteries: Normal in size without proximal filling defect.   Pulmonary veins: Normal pulmonary venous drainage.   Pericardium: Normal thickness with no significant effusion or calcium present.   Mitral Valve: The mitral valve is normal structure with mild annular calcium.   Extra-cardiac findings: See attached radiology report for non-cardiac structures.   IMPRESSION: 1. Annular measurements support a 26 mm S3 or 29 mm Evolut Pro TAVR.   2. No significant annular or subannular calcifications.  3.  Sufficient coronary to annulus distance.   4. Optimal Fluoroscopic Angle for Delivery: LAO 26 CRA 6   Shively T. Flora Lipps, MD   Electronically Signed: By: Lennie Odor M.D. On: 06/03/2023 15:09          Narrative & Impression  CLINICAL DATA:  Preoperative evaluation prior to TAVR   EXAM: CT ANGIOGRAPHY CHEST, ABDOMEN AND PELVIS   TECHNIQUE: Non-contrast CT of the chest was initially obtained.   Multidetector CT imaging through the chest, abdomen and pelvis was performed using the standard protocol during bolus administration of intravenous contrast. Multiplanar reconstructed images and MIPs were obtained and reviewed to evaluate the vascular anatomy.   RADIATION DOSE REDUCTION: This exam was performed according to the departmental dose-optimization program which includes automated exposure control, adjustment of the mA and/or kV according to patient size and/or use of iterative reconstruction technique.   CONTRAST:  95mL OMNIPAQUE IOHEXOL 350 MG/ML SOLN   COMPARISON:  Prior CT scan of the abdomen and pelvis 02/27/2023   FINDINGS: CTA CHEST FINDINGS   Cardiovascular: Thickened and calcified aortic valve. The heart is normal in size. No pericardial effusion. Two vessel arch anatomy. The right brachiocephalic and left common carotid artery share a common origin. No evidence of aortic aneurysm or dissection. Trace scattered atherosclerotic plaque. Calcifications along the left anterior descending, circumflex and right coronary arteries.   Mediastinum/Nodes: No mediastinal mass or suspicious adenopathy.   Lungs/Pleura: 8 mm subpleural pulmonary nodule in the periphery of the left upper lobe (image 183 series 4). 4 mm subpleural pulmonary nodule affiliated with the left major fissure (image 160 series 4). No focal airspace infiltrate, pleural effusion or pneumothorax.   Musculoskeletal: No acute fracture or aggressive appearing lytic or blastic osseous lesion.    Review of the MIP images confirms the above findings.   CTA ABDOMEN AND PELVIS FINDINGS   VASCULAR   Aorta: No aneurysm or dissection. Scattered calcified atherosclerotic plaque.   Celiac: Patent without evidence of aneurysm, dissection, vasculitis or significant stenosis.   SMA: Calcified atherosclerotic plaque at the origin results in mild stenosis.   Renals: Solitary renal arteries bilaterally. Calcified plaque results in mild to moderate stenosis of both renal artery origins.   IMA: Patent without evidence of aneurysm, dissection, vasculitis or significant stenosis.   Inflow: Patent without evidence of aneurysm, dissection, vasculitis or significant stenosis. Outflow arteries are also widely patent and robust. There is a high bifurcation of the left common femoral artery.   Veins: No obvious venous abnormality within the limitations of this arterial phase study.   Review of the MIP images confirms the above findings.   NON-VASCULAR   Hepatobiliary: Normal hepatic contour and morphology. No discrete hepatic lesion. The gallbladder is surgically absent. Diffuse mild enlargement of the common bile duct to 1.5 cm. No evidence of choledocholithiasis or distal obstructing mass.   Pancreas: Unremarkable. No pancreatic ductal dilatation or surrounding inflammatory changes.   Spleen: Normal in size without focal abnormality.   Adrenals/Urinary Tract: Adrenal glands are unremarkable. Kidneys are normal, without renal calculi, focal lesion, or hydronephrosis. Bladder is unremarkable.   Stomach/Bowel: Unremarkable appearance of the stomach and duodenum. Colonic diverticular disease without CT evidence of active inflammation. No evidence of obstruction or focal bowel Keelan thickening. Normal appendix in the right lower quadrant. The terminal ileum is unremarkable.   Lymphatic: No suspicious lymphadenopathy.   Reproductive: Status post hysterectomy. No adnexal masses.    Other: No abdominal Shon hernia or abnormality. No abdominopelvic ascites.  Musculoskeletal: No acute fracture or aggressive appearing lytic or blastic osseous lesion.   Review of the MIP images confirms the above findings.   IMPRESSION: VASCULAR   1. Thickened and calcified aortic valve consistent with the clinical history of aortic valvular disease. 2. No evidence of aortic dissection or aneurysm. Scattered atherosclerotic calcified plaque. 3. High bifurcation of the left common femoral artery. 4. Normal anatomic bifurcation of the right common femoral artery. 5. Femoral and iliac access vessels are robust with minimal atherosclerotic plaque and tortuosity. 6. 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 7. Calcified atherosclerotic plaque along the coronary arteries.   NON VASCULAR   1. Approximately 8 mm subpleural pulmonary nodule along the lateral aspect of the left upper lobe. Non-contrast chest CT at 6-12 months is recommended. If the nodule is stable at time of repeat CT, then future CT at 18-24 months (from today's scan) is considered optional for low-risk patients, but is recommended for high-risk patients. This recommendation follows the consensus statement: Guidelines for Management of Incidental Pulmonary Nodules Detected on CT Images: From the Fleischner Society 2017; Radiology 2017; 284:228-243. 2. Colonic diverticular disease without CT evidence of active inflammation. 3. Biliary ductal dilatation without visible choledocholithiasis or obstructing mass. The gallbladder is surgically absent. Findings may be normal given patient age and prior cholecystectomy status. Recommend correlation with serum LFTs and bilirubin.     Electronically Signed   By: Malachy Moan M.D.   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  She is still having occasional episodes of dizziness with standing up.  She was having chest discomfort which appears to have resolved since her stenting procedure.  I have personally reviewed her 2D echocardiogram, cardiac catheterization, and CTA studies.  Her echocardiogram shows a severely calcified trileaflet aortic valve with a mean gradient of 34 mmHg and a valve area by VTI of 0.92 cm.  Left ventricular ejection fraction is 55%.  Her catheterization shows diffuse coronary calcification with a high-grade mid RCA stenosis that was successfully treated with PCI/DES.  She had elevated filling pressures at the time of her catheterization.  I agree that aortic valve replacement is indicated in this patient for relief of her symptoms and to prevent progressive left ventricular dysfunction.  Given her age and comorbid risk factors I think she would have significantly increased risk for open surgical aortic valve replacement.  I think the best option for treating her would be transcatheter aortic valve replacement.  Her gated cardiac CTA shows anatomy suitable for TAVR using a 26 mm SAPIEN 3 valve possibly removing some volume since she has a small right sinus and smaller STJ of 24.5 mm.  Her abdominal and pelvic CTA shows adequate pelvic vascular anatomy to allow transfemoral insertion.     The patient was counseled at length regarding treatment alternatives for management of severe symptomatic aortic stenosis. The risks and benefits of surgical intervention has been discussed in detail. Long-term prognosis with medical therapy was discussed. Alternative approaches such as conventional surgical aortic valve replacement, transcatheter aortic valve replacement, and palliative medical therapy were compared and contrasted at length. This discussion was placed in the context of the patient's own specific clinical presentation and past  medical history. All of her questions have been addressed.    Following the decision  to proceed with transcatheter aortic valve replacement, a discussion was held regarding what types of management strategies would be attempted intraoperatively in the event of life-threatening complications, including whether or not the patient would be considered a candidate for the use of cardiopulmonary bypass and/or conversion to open sternotomy for attempted surgical intervention.  I think she would be a candidate for emergent sternotomy to manage any intraoperative complications.  Her risk of bleeding would be increased since her aspirin and Plavix cannot be stopped due to recent PCI.  The patient is aware of the fact that transient use of cardiopulmonary bypass may be necessary. The patient has been advised of a variety of complications that might develop including but not limited to risks of death, stroke, paravalvular leak, aortic dissection or other major vascular complications, aortic annulus rupture, device embolization, cardiac rupture or perforation, mitral regurgitation, acute myocardial infarction, arrhythmia, heart block or bradycardia requiring permanent pacemaker placement, congestive heart failure, respiratory failure, renal failure, pneumonia, infection, other late complications related to structural valve deterioration or migration, or other complications that might ultimately cause a temporary or permanent loss of functional independence or other long term morbidity. The patient provides full informed consent for the procedure as described and all questions were answered.   She has been enrolled in the PROGRESS CAP TRIAL.        Plan:   Transfemoral TAVR using a SAPIEN 3 valve in the PROGRESS CAP TRIAL.       Alleen Borne, MD

## 2023-07-09 NOTE — Op Note (Signed)
 [facility]  TAVR OPERATIVE NOTE   Date of Procedure:  07/09/2023  Preoperative Diagnosis: Moderate Aortic Stenosis   Postoperative Diagnosis: Same   Procedure:   Transcatheter Aortic Valve Replacement - Transfemoral Approach  Edwards Sapien 3 Resilia THV (size 26 mm, model # [id number] )   Co-Surgeons:  Alleen Borne, MD and Alverda Skeans, MD Anesthesiologist:  Gaynelle Adu, MD  Echocardiographer:  Riley Lam, MD  Pre-operative Echo Findings: Moderate aortic stenosis Normal left ventricular systolic function  Post-operative Echo Findings: No paravalvular leak Normal left ventricular systolic function  Left Heart Catheterization Findings: Left ventricular end-diastolic pressure of   BRIEF CLINICAL NOTE AND INDICATIONS FOR SURGERY  The patient is a 78 year old female with a history of hypertension, hyperlipidemia, CKD stage IIIb, osteoarthritis, coronary artery disease status post PCI of mid right coronary artery in December 2024, BMI of 31, and symptomatic moderate aortic stenosis who is referred for elective transcatheter aortic valve replacement as part of the continuing access program for the PROGRESS research trial.  During the course of the patient's preoperative work up they have been evaluated comprehensively by a multidisciplinary team of specialists coordinated through the Multidisciplinary [facility]ve Clinic in the [facility].  They have been demonstrated to suffer from symptomatic severe aortic stenosis as noted above. The patient has been counseled extensively as to the relative risks and benefits of all options for the treatment of severe aortic stenosis including long term medical therapy, conventional surgery for aortic valve replacement, and transcatheter aortic valve replacement.  The patient has been independently evaluated by Dr. Laneta Simmers with CT surgery and they are felt to be at high risk for  conventional surgical aortic valve replacement. The surgeon indicated the patient would be a poor candidate for conventional surgery. Based upon review of all of the patient's preoperative diagnostic tests they are felt to be candidate for transcatheter aortic valve replacement using the transfemoral approach as an alternative to high risk conventional surgery.    Following the decision to proceed with transcatheter aortic valve replacement, a discussion has been held regarding what types of management strategies would be attempted intraoperatively in the event of life-threatening complications, including whether or not the patient would be considered a candidate for the use of cardiopulmonary bypass and/or conversion to open sternotomy for attempted surgical intervention.  The patient has been advised of a variety of complications that might develop peculiar to this approach including but not limited to risks of death, stroke, paravalvular leak, aortic dissection or other major vascular complications, aortic annulus rupture, device embolization, cardiac rupture or perforation, acute myocardial infarction, arrhythmia, heart block or bradycardia requiring permanent pacemaker placement, congestive heart failure, respiratory failure, renal failure, pneumonia, infection, other late complications related to structural valve deterioration or migration, or other complications that might ultimately cause a temporary or permanent loss of functional independence or other long term morbidity.  The patient provides full informed consent for the procedure as described and all questions were answered preoperatively.    DETAILS OF THE OPERATIVE PROCEDURE  PREPARATION:   The patient is brought to the operating room on the above mentioned date and central monitoring was established by the anesthesia team. The patient is placed in the supine position on the operating table.  Intravenous antibiotics are administered.  Conscious sedation is used.   Baseline transthoracic echocardiogram was performed. The patient's chest, abdomen, both groins, and both lower extremities are prepared and draped in a sterile manner. A  time out procedure is performed.   PERIPHERAL ACCESS:   Using the modified Seldinger technique, femoral arterial and venous access were obtained with placement of a 6 Fr sheath in the left common femoral artery and a 6 Fr sheath in the right femoral vein using u/s guidance.  A pigtail diagnostic catheter was passed through the femoral arterial sheath under fluoroscopic guidance into the aortic root.    Aortic root angiography was performed in order to determine the optimal angiographic angle for valve deployment.  TRANSFEMORAL ACCESS:  A micropuncture kit was used to gain access to the right common femoral artery using u/s guidance. Position confirmed with angiography. Pre-closure with double ProGlide closure devices. The patient was heparinized systemically and ACT verified > 250 seconds.    A 14 Fr transfemoral E-sheath was introduced into the right common femoral artery after progressively dilating over an Amplatz superstiff wire. A 6Fr pigtail catheter was used to direct a straight-tip exchange length wire across the native aortic valve into the left ventricle. This was exchanged out for a pigtail catheter and position was confirmed in the LV apex. Simultaneous left ventricular, aortic, and left ventricular end-diastolic pressures were recorded.  The pigtail catheter was then exchanged for an Safari wire in the LV apex.  Direct LV pacing thresholds were assessed and found to be adequate.   TRANSCATHETER HEART VALVE DEPLOYMENT:  An Edwards Sapien 3 THV (size 26 mm) was prepared and crimped per manufacturer's guidelines, and the proper orientation of the valve is confirmed on the [employer] delivery system. The valve was advanced through the introducer sheath using normal technique until in an  appropriate position in the abdominal aorta beyond the sheath tip. The balloon was then retracted and using the fine-tuning wheel was centered on the valve. The valve was then advanced across the aortic arch using appropriate flexion of the catheter. The valve was carefully positioned across the aortic valve annulus. The Commander catheter was retracted using normal technique. Once final position of the valve has been confirmed by angiographic assessment, the valve is deployed while temporarily holding ventilation and during rapid ventricular pacing to maintain systolic blood pressure < 50 mmHg and pulse pressure < 10 mmHg. The balloon inflation is held for >3 seconds after reaching full deployment volume. Once the balloon has fully deflated the balloon is retracted into the ascending aorta and valve function is assessed using TTE. There is felt to be no paravalvular leak and no central aortic insufficiency.  The patient's hemodynamic recovery following valve deployment is good.  The deployment balloon and guidewire are both removed. Echo demostrated acceptable post-procedural gradients, stable mitral valve function, and no AI.   PROCEDURE COMPLETION:  The sheath was then removed and closure devices were completed. Protamine was administered once femoral arterial repair was complete. The temporary pacemaker, pigtail catheters and femoral sheaths were removed with a Mynx closure device placed in the artery and manual pressure used for venous hemostasis.    The patient tolerated the procedure well and is transported to the surgical intensive care in stable condition. There were no immediate intraoperative complications. All sponge instrument and needle counts are verified correct at completion of the operation.   No blood products were administered during the operation.  The patient received a total of 75 mL of intravenous contrast during the procedure.  Orbie Pyo MD 07/09/2023 9:21 AM

## 2023-07-09 NOTE — Discharge Summary (Incomplete)
 [facility]   MULTIDISCIPLINARY HEART VALVE TEAM  Discharge Summary    Patient ID: Samantha Wood MRN: [id number]; DOB: March 22, 1946  Admit date: 07/09/2023 Discharge date: 07/10/2023  Primary Care Provider: Richardean Chimera, MD  Primary Cardiologist: Orbie Pyo, MD   Discharge Diagnoses    Principal Problem:   S/P TAVR (transcatheter aortic valve replacement) Active Problems:   Sarcoidosis   Postsurgical hypothyroidism   Follicular thyroid carcinoma (HCC)   Hypertension   Aortic stenosis, moderate   CAD (coronary artery disease)   HLD (hyperlipidemia)   CKD (chronic kidney disease) stage 3, GFR 30-59 ml/min (HCC)   Acute on chronic heart failure with preserved ejection fraction (HFpEF) (HCC)   Obesity   Allergies Allergies  Allergen Reactions   Amoxicillin Nausea And Vomiting    Headache   Codeine Other (See Comments)    "Bad headache"   Diltiazem Other (See Comments)    "Bad headache"    Diagnostic Studies/Procedures    [facility]  TAVR OPERATIVE NOTE     Date of Procedure:                07/09/2023   Preoperative Diagnosis:      Moderate Aortic Stenosis    Postoperative Diagnosis:    Same    Procedure:        Transcatheter Aortic Valve Replacement - Transfemoral Approach             Edwards Sapien 3 Resilia THV (size 26 mm, model # [id number] )              Co-Surgeons:                        Alleen Borne, MD and Alverda Skeans, MD Anesthesiologist:                  Gaynelle Adu, MD   Echocardiographer:              Riley Lam, MD   Pre-operative Echo Findings: Moderate aortic stenosis Normal left ventricular systolic function   Post-operative Echo Findings: No paravalvular leak Normal left ventricular systolic function   Left Heart Catheterization Findings: Left ventricular end-diastolic pressure of  _____________    Echo 07/10/23: completed but pending formal read at the time of  discharge   History of Present Illness     Samantha Wood is a 78 y.o. female with a history of obesity (BMI 31), HTN, HLD, CKD stage IIIb, OA, CAD s/p DES/PCI to mRCA (05/17/23) and mod aortic stenosis who presented to [facility] on 07[facility]09[facility]23 for planned TAVR through the Progress CAP trial.   She was seen initially by Dr. Lynnette Caffey in 10/2022 concerning her aortic stenosis.  Echocardiogram at that time showed moderate aortic stenosis with normal left ventricular systolic function.  She was reporting increasing exertional chest discomfort, fatigue, and dizziness.  She decided that she did not want to proceed with further workup at that time.  She was seen back in 04/2023 and echo showed EF 55%, and moderate AS with a mean grad 34 mmHg, AVA 0.98 cm2, DVI 0.33, SVI 45, and mild MR/MS. She reported being more symptomatic with exertional fatigue, shortness of breath, and chest tightness with exertion and at rest.  She subsequently underwent cardiac catheterization on 05/17/2023 which showed a 90% mid RCA stenosis that was successfully stented.  She was started on dual antiplatelet therapy.  Her PA pressure  was 50/25 with a mean of 34 and a wedge pressure of 27 with V waves up to 34 mmHg.  She was started on Lasix 20 mg daily due to elevated filling pressures.  She had her electrolytes checked on 06/03/2023 and this showed an increase in her creatinine to 1.56 felt to be due to dehydration. Her Lasix was stopped.   The patient was evaluated by the multidisciplinary valve team and felt to have moderate, symptomatic aortic stenosis and to be a suitable candidate for TAVR through the Progress CAP, which was set up for 07/09/23.   Hospital Course     Consultants: none   Moderate AS: s/p successful TAVR with a 26 mm Edwards Sapien 3 Ultra Resilia THV via the TF approach on 07/10/23 through the Progress CAP trial.  -- Post operative echo completed but pending formal read.  -- NYHA class I. CCS angina 1. -- Groin sites are  stable.  -- ECG with NSR and no high grade heart block.  -- Continue Asprin 81mg  daily and Plavix 75mg  daily.  -- Met with cardiac rehab to discuss CRP phase II.  -- Plan for discharge home today with close follow up in the outpatient setting.   CAD: s/p DES/PCI to mRCA (05/17/23).  -- Plan DAPT with aspirin 81mg  daily and Plavix 6 months followed by indefinite Plavix monotherapy. -- She declines statin Rx, but was willing to take Zetia.  -- Continue Zetia 10mg  daily.  Acute on chronic HFpEF: as evidenced by an elevated LVEDP of 21mm hg at the time of TAVR.  -- Treated with one dose of IV Lasix 20mg  and Kclor . -- Creat with mild bump. Plan to discontinue home Lasix and resume Lisinopril-HCT 20-12.5mg  on 07/12/23.  CKD stage IIIb: creat bumped to 1.56 and Lasix was discontinued. Creat improved to 1.22 at PAT labs. -- Creat 1.35 today. Plan to discontinue home Lasix and resume Lisinopril-HCT 20-12.5mg  on 07/12/23. -- Repeat BMET next week.  HTN: BP well controlled.  -- As above resume Lisinopril-HCT 20-12.5mg  on 07/12/23.  Pulmonary nodule: pre TAVR CT showed an "approximately 8 mm subpleural pulmonary nodule along the lateral aspect of the left upper lobe. Non-contrast chest CT at 6-12 months is recommended. If the nodule is stable at time of repeat CT, then future CT at 18-24 months (from today's scan) is considered optional for low-risk patients, but is recommended for high-risk patients." -- This will be discussed with the patient in the outpatient setting.   Biliary ductal dilation: pre TAVR CT showed a "biliary ductal dilatation without visible choledocholithiasis or obstructing mass. The gallbladder is surgically absent. Findings may be normal given patient age and prior cholecystectomy status. Recommend correlation with serum LFTs and bilirubin."  -- PAT labs showed normal LFTs and bilirubin. No further work up necessary.   _____________  Discharge Vitals Blood pressure  136/68, pulse 79, temperature 99.5 F (37.5 C), temperature source Oral, resp. rate (!) 21, height (P) 5\' 7"  (1.702 m), weight 88.9 kg, SpO2 100%.  Filed Weights   07/09/23 0543 07/10/23 0523  Weight: (P) 88.9 kg 88.9 kg   GEN: Well nourished, well developed, in no acute distress HEENT: normal Neck: no JVD or masses Cardiac: RRR; no murmurs, rubs, or gallops,no edema  Respiratory:  clear to auscultation bilaterally, normal work of breathing GI: soft, nontender, nondistended, + BS MS: no deformity or atrophy Skin: warm and dry, no rash.  Groin sites clear without hematoma or ecchymosis  Neuro:  Alert and Oriented  x 3, Strength and sensation are intact Psych: euthymic mood, full affect  Disposition   Pt is being discharged home today in good condition.  Follow-up Plans & Appointments     Follow-up Information     Janetta Hora, PA-C. Go on 07/17/2023.   Specialties: Cardiology, Radiology Why: @ 2:20pm, please arrive at least 10 minutes early. Contact information: [address] [place] [address] [phone]                Discharge Instructions     Amb Referral to Cardiac Rehabilitation   Complete by: As directed    Diagnosis: Valve Replacement   Valve: Aortic   After initial evaluation and assessments completed: Virtual Based Care may be provided alone or in conjunction with Phase 2 Cardiac Rehab based on patient barriers.: Yes   Intensive Cardiac Rehabilitation (ICR) [facility] location only OR Traditional Cardiac Rehabilitation (TCR) *If criteria for ICR are not met will enroll in TCR [facility] only): Yes       Discharge Medications   Allergies as of 07/10/2023       Reactions   Amoxicillin Nausea And Vomiting   Headache   Codeine Other (See Comments)   "Bad headache"   Diltiazem Other (See Comments)   "Bad headache"        Medication List     STOP taking these medications    furosemide 20 MG tablet Commonly known as: LASIX        TAKE these medications    acetaminophen 650 MG CR tablet Commonly known as: TYLENOL Take 1,300 mg by mouth 2 (two) times daily.   aspirin EC 81 MG tablet Take 1 tablet (81 mg total) by mouth daily. Swallow whole.   AZO CRANBERRY PO Take 1 tablet by mouth 2 (two) times daily.   Blue-Emu Hemp 10 % cream Generic drug: trolamine salicylate Apply 1 Application topically at bedtime.   calcitRIOL 0.25 MCG capsule Commonly known as: ROCALTROL Take 0.25 mcg by mouth 2 (two) times daily.   CALCIUM-MAGNESIUM-ZINC-D3 PO Take 2 tablets by mouth 2 (two) times daily.   clopidogrel 75 MG tablet Commonly known as: Plavix Take 1 tablet (75 mg total) by mouth daily.   diphenhydrAMINE 25 MG tablet Commonly known as: BENADRYL Take 50 mg by mouth at bedtime.   estradiol 1 MG tablet Commonly known as: ESTRACE Take 1 mg by mouth daily.   ezetimibe 10 MG tablet Commonly known as: ZETIA Take 1 tablet (10 mg total) by mouth daily.   levothyroxine 137 MCG tablet Commonly known as: SYNTHROID Take 137 mcg by mouth daily before breakfast.   lisinopril-hydrochlorothiazide 20-12.5 MG tablet Commonly known as: ZESTORETIC Take 1 tablet by mouth daily. Notes to patient: HOLD. OKAY TO RESUME ON 07/12/23.   LORazepam 0.5 MG tablet Commonly known as: ATIVAN Take 0.125 mg by mouth every 8 (eight) hours as needed for anxiety.   nitroGLYCERIN 0.4 MG SL tablet Commonly known as: Nitrostat Place 1 tablet (0.4 mg total) under the tongue every 5 (five) minutes as needed for chest pain.        Outstanding Labs/Studies   BMET ___________________  Duration of Discharge Encounter: APP Time: 35 minutes    Signed, Cline Crock, PA-C 07/10/2023, 10:07 AM [phone]   ATTENDING ATTESTATION:  After conducting a review of all available clinical information with the care team, interviewing the patient, and performing a physical exam, I agree with the findings and plan described in  this note.  GEN: No acute distress.   HEENT:  MMM, no JVD, no scleral icterus Cardiac: RRR, no murmurs, rubs, or gallops.  Respiratory: Clear to auscultation bilaterally. GI: Soft, nontender, non-distended  MS: No edema; No deformity. Neuro:  Nonfocal  Vasc:  +2 radial pulses; access sites intact  Patient doing well after TAVR with stable access sites, no evidence of stroke, and no conduction abnormalities.  Follow up echocardiogram with planned discharge and close hospital follow up.  APP discharge time:35 MD discharge time:10  Alverda Skeans, MD Pager [phone]

## 2023-07-09 NOTE — Plan of Care (Signed)

## 2023-07-09 NOTE — Progress Notes (Signed)
   07/09/23 1029  Vitals  Temp 97.8 F (36.6 C)  Temp Source Oral  BP (!) 162/71  MAP (mmHg) 89  BP Location Right Arm  BP Method Automatic  Patient Position (if appropriate) Lying  Pulse Rate 71  ECG Heart Rate 71  Resp 18  Oxygen Therapy  SpO2 98 %  O2 Device Room Air   Patient arrived from cath lab to 4e06, patient placed on telemetry monitor, groins level 0 no hematoma noted at this time. Vital signs obtained and CCMD made aware patient on boxy [id number]. Bedside RN and charge RN aware patient arrived. Briseida Gittings, [employer] R

## 2023-07-09 NOTE — Anesthesia Postprocedure Evaluation (Signed)
 Anesthesia Post Note  Patient: Samantha Wood  Procedure(s) Performed: Transcatheter Aortic Valve Replacement, Transfemoral INTRAOPERATIVE TRANSTHORACIC ECHOCARDIOGRAM     Patient location during evaluation: Other Anesthesia Type: MAC Level of consciousness: awake and alert Pain management: pain level controlled Vital Signs Assessment: post-procedure vital signs reviewed and stable Respiratory status: spontaneous breathing, nonlabored ventilation and respiratory function stable Cardiovascular status: stable and blood pressure returned to baseline Postop Assessment: no apparent nausea or vomiting Anesthetic complications: no  There were no known notable events for this encounter.  Last Vitals:  Vitals:   07/09/23 1115 07/09/23 1130  BP: 136/64 (!) 154/73  Pulse: 61 63  Resp: 15 16  Temp:    SpO2: 98% 94%    Last Pain:  Vitals:   07/09/23 1130  TempSrc:   PainSc: 0-No pain                 Jenefer Woerner,W. EDMOND

## 2023-07-09 NOTE — Discharge Instructions (Signed)

## 2023-07-09 NOTE — Progress Notes (Signed)
  [facility]   MULTIDISCIPLINARY HEART VALVE TEAM  Patient doing well s/p TAVR. She is hemodynamically stable. Groin sites stable. ECG with mild 1st deg AV block but no high grade block. Transferred from cath lab holding to 4E. Given elevated LVEDP at the time of TAVR, will treat with Lasix IV 20mg  x1 and Kclor x1. Resume home Lisinopril (on Zestoric but will hold off on hydrochlorothiazide given need for IV lasix)  Early ambulation after bedrest completed and hopeful discharge over the next 24-48 hours.   Cline Crock PA-C  [facility]  Pager [phone]

## 2023-07-10 ENCOUNTER — Inpatient Hospital Stay (HOSPITAL_COMMUNITY): Payer: Medicare Other

## 2023-07-10 DIAGNOSIS — Z952 Presence of prosthetic heart valve: Secondary | ICD-10-CM

## 2023-07-10 LAB — BASIC METABOLIC PANEL
Anion gap: 11 (ref 5–15)
BUN: 17 mg/dL (ref 8–23)
CO2: 25 mmol/L (ref 22–32)
Calcium: 8.2 mg/dL — ABNORMAL LOW (ref 8.9–10.3)
Chloride: 101 mmol/L (ref 98–111)
Creatinine, Ser: 1.35 mg/dL — ABNORMAL HIGH (ref 0.44–1.00)
GFR, Estimated: 40 mL/min — ABNORMAL LOW (ref >60)
Glucose, Bld: 107 mg/dL — ABNORMAL HIGH (ref 70–99)
Potassium: 3.9 mmol/L (ref 3.5–5.1)
Sodium: 137 mmol/L (ref 135–145)

## 2023-07-10 LAB — ECHOCARDIOGRAM COMPLETE
AR max vel: 1.66 cm2
AV Area VTI: 1.68 cm2
AV Area mean vel: 1.77 cm2
AV Mean grad: 8 mm[Hg]
AV Peak grad: 13.9 mm[Hg]
Ao pk vel: 1.87 m/s
Area-P 1/2: 2.24 cm2
Height: 67 in
MV VTI: 1.78 cm2
S' Lateral: 2.8 cm
Weight: 3135.82 [oz_av]

## 2023-07-10 LAB — CBC
HCT: 36.7 % (ref 36.0–46.0)
Hemoglobin: 12.7 g/dL (ref 12.0–15.0)
MCH: 31.9 pg (ref 26.0–34.0)
MCHC: 34.6 g/dL (ref 30.0–36.0)
MCV: 92.2 fL (ref 80.0–100.0)
Platelets: 186 10*3/uL (ref 150–400)
RBC: 3.98 MIL/uL (ref 3.87–5.11)
RDW: 12.8 % (ref 11.5–15.5)
WBC: 6.6 10*3/uL (ref 4.0–10.5)
nRBC: 0 % (ref 0.0–0.2)

## 2023-07-10 NOTE — Progress Notes (Signed)
 CARDIAC REHAB PHASE I     Post TAVR education including site care, restrictions, risk factors, heart healthy diet, exercise guidelines and CRP2 reviewed. All questions and concerns addressed. Will refer to AP for CRP2. Plan for discharge later today.  [id number] Woodroe Chen, RN BSN 07/10/2023 9:36 AM

## 2023-07-10 NOTE — Progress Notes (Signed)
 Discharge instructions reviewed with pt and her husband.  Copy of instructions given to pt. No new scripts. Pt instructed to stop lasix as per d/c instructions.  Pt will be d/c'd via wheelchair with belongings, with her husband and will be    escorted by staff.   Allye Hoyos,RN SWOT

## 2023-07-11 ENCOUNTER — Telehealth: Payer: Self-pay | Admitting: Physician Assistant

## 2023-07-11 NOTE — Telephone Encounter (Signed)
  [facility]   MULTIDISCIPLINARY HEART VALVE TEAM   Patient contacted regarding discharge from [facility] on 07/10/23  Patient understands to follow up with a structural heart APP on 07/17/23 at [facility].  Patient understands discharge instructions? yes Patient understands medications and regimen? yes Patient understands to bring all medications to this visit? yes  Cline Crock PA-C  [facility]

## 2023-07-12 ENCOUNTER — Telehealth (HOSPITAL_COMMUNITY): Payer: Self-pay

## 2023-07-12 NOTE — Telephone Encounter (Signed)
 Called patient regarding referral to cardiac rehab fro TAVR. Left VM to return call.

## 2023-07-12 NOTE — Progress Notes (Signed)
 [facility]   MULTIDISCIPLINARY HEART VALVE CLINIC                                     Cardiology Office Note:    Date:  07/17/2023   ID:  [name], DOB May 18, 1946, MRN [id number]  PCP:  Richardean Chimera, MD  [facility] Cardiologist:  Orbie Pyo, MD  [facility] Electrophysiologist:  None   Referring MD: Richardean Chimera, MD   [facility] s/p TAVR  History of Present Illness:    Samantha Wood is a 78 y.o. female with a hx of obesity (BMI 31), follicular thyroid cancer s/p resection (2013), HTN, HLD, CKD stage IIIb, OA, CAD s/p DES/PCI to mRCA (05/17/23) and moderate aortic stenosis s/p TAVR (05/16/24) though the PROGRESS CAP trial who presents to clinic for follow up.   She was seen initially by Dr. Lynnette Caffey in 10/2022 concerning her aortic stenosis.  Echocardiogram at that time showed moderate aortic stenosis with normal left ventricular systolic function.  She was reporting increasing exertional chest discomfort, fatigue, and dizziness.  She decided that she did not want to proceed with further workup at that time.  She was seen back in 04/2023 and echo showed EF 55%, and moderate AS with a mean grad 34 mmHg, AVA 0.98 cm2, DVI 0.33, SVI 45, and mild MR/MS. She reported being more symptomatic with exertional fatigue, shortness of breath, and chest tightness with exertion and at rest.  She subsequently underwent cardiac catheterization on 05/17/2023 which showed a 90% mid RCA stenosis that was successfully stented.  She was started on dual antiplatelet therapy.  Her PA pressure was 50/25 with a mean of 34 and a wedge pressure of 27 with V waves up to 34 mmHg.  She was started on Lasix 20 mg daily due to elevated filling pressures.  She had her electrolytes checked on 06/03/2023 and this showed an increase in her creatinine to 1.56 felt to be due to dehydration. Her Lasix was stopped.   S/p TAVR with a 26 mm Edwards Sapien 3 Ultra Resilia THV via the TF approach on 07/10/23  through the Progress CAP trial. Post op echo showed EF 60%, normally functioning TAVR with a mean gradient of 8 mmHg and no PVL as well as severe MAC with mild MS. She was treated with one dose of IV Lasix 20mg  for an elevated LVEDP ~21 at the time of TAVR. Creat 1.35 on the day of discharge and home Prinzide was held x 2 days. Home lasix was discontinued.   Today the patient presents to clinic for follow up. Here with her husband. No CP or SOB. Did have a tired aching in her chest one day that self resolved. No LE edema, orthopnea or PND. No dizziness or syncope. No blood in stool or urine. No palpitations. She did have one day where she gained 4 lbs and had abdominal distension and took a Lasix with resolution. She and her husband go dancing at the [employer] in [place].   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 Lynnette Caffey and Dr. Laneta Simmers   Sarcoidosis    Vitiligo      Current Medications: Current Meds  Medication Sig   acetaminophen (TYLENOL) 650 MG CR tablet Take 1,300 mg by mouth 2 (two) times daily.   aspirin EC 81 MG tablet Take 1 tablet (81 mg total) by mouth daily. Swallow whole.   azithromycin (ZITHROMAX) 500 MG tablet Take 1 tablet (500 mg total) by mouth as directed. Take one tablet 1 hour before any dental work including cleanings.   calcitRIOL (ROCALTROL) 0.25 MCG capsule Take 0.25 mcg by mouth 2 (two) times daily.    clopidogrel (PLAVIX) 75 MG tablet Take 1 tablet (75 mg total) by mouth daily.   Cranberry-Vitamin  C-Probiotic (AZO CRANBERRY PO) Take 1 tablet by mouth 2 (two) times daily.   diphenhydrAMINE (BENADRYL) 25 MG tablet Take 50 mg by mouth at bedtime.   estradiol (ESTRACE) 1 MG tablet Take 1 mg by mouth daily.    ezetimibe (ZETIA) 10 MG tablet Take 1 tablet (10 mg total) by mouth daily.   furosemide (LASIX) 20 MG tablet Take 20 mg by mouth daily as needed for fluid.   levothyroxine (SYNTHROID) 137 MCG tablet Take 137 mcg by mouth daily before breakfast.   lisinopril-hydrochlorothiazide (PRINZIDE,ZESTORETIC) 20-12.5 MG per tablet Take 1 tablet by mouth daily.    LORazepam (ATIVAN) 0.5 MG tablet Take 0.125 mg by mouth every 8 (eight) hours as needed for anxiety.   Multiple Minerals-Vitamins (CALCIUM-MAGNESIUM-ZINC-D3 PO) Take 2 tablets by mouth 2 (two) times daily.   nitroGLYCERIN (NITROSTAT) 0.4 MG SL tablet Place 1 tablet (0.4 mg total) under the tongue every 5 (five) minutes as needed for chest pain.   trolamine salicylate (BLUE-EMU HEMP) 10 % cream Apply 1 Application topically at bedtime.      ROS:   Please see the history of present illness.    All other systems reviewed and are negative.  EKGs   EKG Interpretation Date/Time:  Wednesday July 17 2023 14:41:26 EST Ventricular Rate:  61 PR Interval:  172 QRS Duration:  90 QT Interval:  420 QTC Calculation: 422 R Axis:   43  Text Interpretation: Normal sinus rhythm Normal ECG Nonspecific T wave abnormality has replaced inverted T waves in Inferior leads Confirmed by Cline Crock [phone]) on 07/17/2023 2:54:18 PM   Risk Assessment/Calculations:           Physical Exam:    VS:  BP 124/78   Pulse 67   Ht 5\' 7"  (1.702 m)   Wt 199 lb 3.2 oz (90.4 kg)   SpO2 98%   BMI 31.20 kg/m     Wt Readings from Last 3 Encounters:  07/17/23 199 lb 3.2 oz (90.4 kg)  07/10/23 195 lb 15.8 oz (88.9 kg)  06/28/23 196 lb (88.9 kg)     GEN: Well nourished, well developed in no acute distress NECK: No JVD CARDIAC: RRR, soft flow  murmur @ RUSB. No rubs, gallops RESPIRATORY:  Clear to auscultation without rales, wheezing or rhonchi  ABDOMEN: Soft, non-tender, non-distended EXTREMITIES:  No edema; No deformity.  Groin sites clear without hematoma or ecchymosis.   ASSESSMENT:    1. S/P TAVR (transcatheter aortic valve replacement)   2. Coronary artery disease involving native coronary artery of native  heart without angina pectoris   3. Chronic heart failure with preserved ejection fraction (HCC)   4. Stage 3b chronic kidney disease (HCC)   5. Medication management     PLAN:    In order of problems listed above:  Moderate AS s/p TAVR:  -- Pt doing great s/p TAVR through the Progress CAP Trial .  -- ECG with no HAVB.  -- Groin sites healing well.  -- SBE prophylaxis discussed; I have RX'd azithromycin due to a PCN allergy.  -- Continue Asprin 81mg  daily and Plavix 75mg  daily.  -- Cleared to resume all activities without restrictions.  -- I will see back for 1 month echo and OV.   CAD:  -- S/p DES/PCI to mRCA (05/17/23).  -- Plan DAPT with aspirin 81mg  daily and Plavix 6 months (11/15/23) followed by indefinite Plavix monotherapy. -- She declines statin Rx, but was willing to take Zetia.  -- Continue Zetia 10mg  daily.   Chronic HFpEF:  -- Continue Lisinopril-HCT 20-12.5mg   -- Okay to use PRN lasix. Lasix 20mg  as needed added back to med list.  -- BMET today   CKD stage IIIb:  -- Creat 1.35 at discharge. -- BMET today   HTN:  -- BP 124/78 today  -- Continue Lisinopril-HCT 20-12.5mg   Pulmonary nodule:  -- Pre TAVR CT showed an "approximately 8 mm subpleural pulmonary nodule along the lateral aspect of the left upper lobe. Non-contrast chest CT at 6-12 months is recommended. If the nodule is stable at time of repeat CT, then future CT at 18-24 months (from today's scan) is considered optional for low-risk patients, but is recommended for high-risk patients." -- Given history of follicular thyroid cancer,  I will get a non contrast CT set up in 11/2023 at the her next visit.    Biliary ductal dilation:  -- Pre TAVR CT showed a "biliary ductal dilatation without visible choledocholithiasis or obstructing mass. The gallbladder is surgically absent. Findings may be normal given patient age and prior cholecystectomy status. Recommend correlation with serum LFTs and bilirubin."  -- PAT labs showed normal LFTs and bilirubin.  -- No further work up necessary.      Cardiac Rehabilitation Eligibility Assessment  The patient is ready to start cardiac rehabilitation from a cardiac standpoint.    Medication Adjustments/Labs and Tests Ordered: Current medicines are reviewed at length with the patient today.  Concerns regarding medicines are outlined above.  Orders Placed This Encounter  Procedures   Basic metabolic panel   EKG 12-Lead   Meds ordered this encounter  Medications   azithromycin (ZITHROMAX) 500 MG tablet    Sig: Take 1 tablet (500 mg total) by mouth as directed. Take one tablet 1 hour before any dental work including cleanings.    Dispense:  6 tablet    Refill:  12    Supervising Provider:   Tonny Bollman [[id number]]    Patient Instructions  Medication Instructions:  The current medical regimen is effective;  continue present plan and medications.  *If you need a refill on your cardiac medications before your next appointment, please call your pharmacy*  Lab Work: Please have blood work today (BMP)  If you have labs (blood work) drawn today and your tests are completely normal, you will receive your results only by: MyChart Message (if you have MyChart) OR A paper copy in the mail If you have any lab test that is abnormal or we need to change your treatment, we will call you to review the  results.  Follow-Up: At [facility], you and your health needs are our priority.  As part of our continuing mission to provide you with exceptional heart care, we have created  designated Provider Care Teams.  These Care Teams include your primary Cardiologist (physician) and Advanced Practice Providers (APPs -  Physician Assistants and Nurse Practitioners) who all work together to provide you with the care you need, when you need it.  We recommend signing up for the patient portal called "MyChart".  Sign up information is provided on this After Visit Summary.  MyChart is used to connect with patients for Virtual Visits (Telemedicine).  Patients are able to view lab/test results, encounter notes, upcoming appointments, etc.  Non-urgent messages can be sent to your provider as well.   To learn more about what you can do with MyChart, go to [email or web address].    Your next appointment:   As scheduled    Signed, Cline Crock, PA-C  07/17/2023 2:59 PM    [facility]

## 2023-07-15 DIAGNOSIS — Z13220 Encounter for screening for lipoid disorders: Secondary | ICD-10-CM | POA: Diagnosis not present

## 2023-07-15 DIAGNOSIS — Z1329 Encounter for screening for other suspected endocrine disorder: Secondary | ICD-10-CM | POA: Diagnosis not present

## 2023-07-15 DIAGNOSIS — E559 Vitamin D deficiency, unspecified: Secondary | ICD-10-CM | POA: Diagnosis not present

## 2023-07-15 DIAGNOSIS — N183 Chronic kidney disease, stage 3 unspecified: Secondary | ICD-10-CM | POA: Diagnosis not present

## 2023-07-15 DIAGNOSIS — E1122 Type 2 diabetes mellitus with diabetic chronic kidney disease: Secondary | ICD-10-CM | POA: Diagnosis not present

## 2023-07-15 DIAGNOSIS — E1165 Type 2 diabetes mellitus with hyperglycemia: Secondary | ICD-10-CM | POA: Diagnosis not present

## 2023-07-16 ENCOUNTER — Encounter (HOSPITAL_COMMUNITY): Payer: Self-pay | Admitting: Internal Medicine

## 2023-07-17 ENCOUNTER — Ambulatory Visit: Payer: Medicare Other | Attending: Physician Assistant | Admitting: Physician Assistant

## 2023-07-17 VITALS — BP 124/78 | HR 67 | Ht 67.0 in | Wt 199.2 lb

## 2023-07-17 DIAGNOSIS — Z952 Presence of prosthetic heart valve: Secondary | ICD-10-CM | POA: Diagnosis not present

## 2023-07-17 DIAGNOSIS — Z79899 Other long term (current) drug therapy: Secondary | ICD-10-CM | POA: Diagnosis not present

## 2023-07-17 DIAGNOSIS — N1832 Chronic kidney disease, stage 3b: Secondary | ICD-10-CM

## 2023-07-17 DIAGNOSIS — I2510 Atherosclerotic heart disease of native coronary artery without angina pectoris: Secondary | ICD-10-CM

## 2023-07-17 DIAGNOSIS — I5032 Chronic diastolic (congestive) heart failure: Secondary | ICD-10-CM | POA: Diagnosis not present

## 2023-07-17 MED ORDER — AZITHROMYCIN 500 MG PO TABS: 500.0000 mg | ORAL_TABLET | ORAL | 12 refills | Status: AC

## 2023-07-17 NOTE — Patient Instructions (Signed)
 Medication Instructions:  The current medical regimen is effective;  continue present plan and medications.  *If you need a refill on your cardiac medications before your next appointment, please call your pharmacy*  Lab Work: Please have blood work today (BMP)  If you have labs (blood work) drawn today and your tests are completely normal, you will receive your results only by: MyChart Message (if you have MyChart) OR A paper copy in the mail If you have any lab test that is abnormal or we need to change your treatment, we will call you to review the results.  Follow-Up: At [facility], you and your health needs are our priority.  As part of our continuing mission to provide you with exceptional heart care, we have created designated Provider Care Teams.  These Care Teams include your primary Cardiologist (physician) and Advanced Practice Providers (APPs -  Physician Assistants and Nurse Practitioners) who all work together to provide you with the care you need, when you need it.  We recommend signing up for the patient portal called "MyChart".  Sign up information is provided on this After Visit Summary.  MyChart is used to connect with patients for Virtual Visits (Telemedicine).  Patients are able to view lab/test results, encounter notes, upcoming appointments, etc.  Non-urgent messages can be sent to your provider as well.   To learn more about what you can do with MyChart, go to [email or web address].    Your next appointment:   As scheduled

## 2023-07-18 ENCOUNTER — Encounter (HOSPITAL_COMMUNITY): Payer: Self-pay

## 2023-07-18 LAB — BASIC METABOLIC PANEL
BUN/Creatinine Ratio: 19 (ref 12–28)
BUN: 23 mg/dL (ref 8–27)
CO2: 27 mmol/L (ref 20–29)
Calcium: 10.5 mg/dL — ABNORMAL HIGH (ref 8.7–10.3)
Chloride: 101 mmol/L (ref 96–106)
Creatinine, Ser: 1.21 mg/dL — ABNORMAL HIGH (ref 0.57–1.00)
Glucose: 87 mg/dL (ref 70–99)
Potassium: 4.8 mmol/L (ref 3.5–5.2)
Sodium: 141 mmol/L (ref 134–144)
eGFR: 46 mL/min/{1.73_m2} — ABNORMAL LOW (ref >59)

## 2023-07-19 DIAGNOSIS — E1122 Type 2 diabetes mellitus with diabetic chronic kidney disease: Secondary | ICD-10-CM | POA: Diagnosis not present

## 2023-07-19 DIAGNOSIS — E209 Hypoparathyroidism, unspecified: Secondary | ICD-10-CM | POA: Diagnosis not present

## 2023-07-19 DIAGNOSIS — K2100 Gastro-esophageal reflux disease with esophagitis, without bleeding: Secondary | ICD-10-CM | POA: Diagnosis not present

## 2023-07-23 ENCOUNTER — Encounter (HOSPITAL_COMMUNITY)
Admission: RE | Admit: 2023-07-23 | Discharge: 2023-07-23 | Disposition: A | Discharge status: Home / Self Care | Payer: Medicare Other | Source: Ambulatory Visit | Attending: Internal Medicine | Admitting: Internal Medicine

## 2023-07-23 VITALS — Ht 63.0 in | Wt 200.2 lb

## 2023-07-23 DIAGNOSIS — Z952 Presence of prosthetic heart valve: Secondary | ICD-10-CM | POA: Diagnosis not present

## 2023-07-23 NOTE — Patient Instructions (Signed)
 Patient Instructions  Patient Details  Name: Samantha Wood MRN: [id number] Date of Birth: 1946/05/04 Referring Provider:  Orbie Pyo, MD  Below are your personal goals for exercise, nutrition, and risk factors. Our goal is to help you stay on track towards obtaining and maintaining these goals. We will be discussing your progress on these goals with you throughout the program.  Initial Exercise Prescription:  Initial Exercise Prescription - 07/23/23 0900       Date of Initial Exercise RX and Referring Provider   Date 07/23/23    Referring Provider Alverda Skeans MD      Oxygen   Maintain Oxygen Saturation 88% or higher      Treadmill   MPH 1.5    Grade 0.5    Minutes 15    METs 2.25      NuStep   Level 1    SPM 80    Minutes 15    METs 2.5      Prescription Details   Frequency (times per week) 2    Duration Progress to 30 minutes of continuous aerobic without signs/symptoms of physical distress      Intensity   THRR 40-80% of Max Heartrate 96-127    Ratings of Perceived Exertion 11-13    Perceived Dyspnea 0-4      Progression   Progression Continue to progress workloads to maintain intensity without signs/symptoms of physical distress.      Resistance Training   Training Prescription Yes    Weight 3 lb    Reps 10-15             Exercise Goals: Frequency: Be able to perform aerobic exercise two to three times per week in program working toward 2-5 days per week of home exercise.  Intensity: Work with a perceived exertion of 11 (fairly light) - 15 (hard) while following your exercise prescription.  We will make changes to your prescription with you as you progress through the program.   Duration: Be able to do 30 to 45 minutes of continuous aerobic exercise in addition to a 5 minute warm-up and a 5 minute cool-down routine.   Nutrition Goals: Your personal nutrition goals will be established when you do your nutrition analysis with the  dietician.  The following are general nutrition guidelines to follow: Cholesterol < 200mg /day Sodium < 1500mg /day Fiber: Women over 50 yrs - 21 grams per day  Personal Goals:  Personal Goals and Risk Factors at Admission - 07/23/23 0959       Core Components/Risk Factors/Patient Goals on Admission    Weight Management Yes;Obesity;Weight Loss    Intervention Weight Management: Develop a combined nutrition and exercise program designed to reach desired caloric intake, while maintaining appropriate intake of nutrient and fiber, sodium and fats, and appropriate energy expenditure required for the weight goal.;Weight Management: Provide education and appropriate resources to help participant work on and attain dietary goals.;Weight Management/Obesity: Establish reasonable short term and long term weight goals.;Obesity: Provide education and appropriate resources to help participant work on and attain dietary goals.    Admit Weight 200 lb 3.2 oz (90.8 kg)    Goal Weight: Short Term 195 lb (88.5 kg)    Goal Weight: Long Term 190 lb (86.2 kg)    Expected Outcomes Short Term: Continue to assess and modify interventions until short term weight is achieved;Long Term: Adherence to nutrition and physical activity/exercise program aimed toward attainment of established weight goal;Weight Loss: Understanding of general recommendations for a balanced  deficit meal plan, which promotes 1-2 lb weight loss per week and includes a negative energy balance of [phone] kcal/d;Understanding recommendations for meals to include 15-35% energy as protein, 25-35% energy from fat, 35-60% energy from carbohydrates, less than 200mg  of dietary cholesterol, 20-35 gm of total fiber daily;Understanding of distribution of calorie intake throughout the day with the consumption of 4-5 meals/snacks    Hypertension Yes    Intervention Provide education on lifestyle modifcations including regular physical activity/exercise, weight  management, moderate sodium restriction and increased consumption of fresh fruit, vegetables, and low fat dairy, alcohol moderation, and smoking cessation.;Monitor prescription use compliance.    Expected Outcomes Short Term: Continued assessment and intervention until BP is < 140/31mm HG in hypertensive participants. < 130/25mm HG in hypertensive participants with diabetes, heart failure or chronic kidney disease.;Long Term: Maintenance of blood pressure at goal levels.    Lipids Yes    Intervention Provide education and support for participant on nutrition & aerobic/resistive exercise along with prescribed medications to achieve LDL 70mg , HDL >40mg .    Expected Outcomes Short Term: Participant states understanding of desired cholesterol values and is compliant with medications prescribed. Participant is following exercise prescription and nutrition guidelines.;Long Term: Cholesterol controlled with medications as prescribed, with individualized exercise RX and with personalized nutrition plan. Value goals: LDL < 70mg , HDL > 40 mg.             Tobacco Use Initial Evaluation: Social History   Tobacco Use  Smoking Status Never  Smokeless Tobacco Never    Exercise Goals and Review:  Exercise Goals     Row Name 07/23/23 0953             Exercise Goals   Increase Physical Activity Yes       Intervention Provide advice, education, support and counseling about physical activity/exercise needs.;Develop an individualized exercise prescription for aerobic and resistive training based on initial evaluation findings, risk stratification, comorbidities and participant's personal goals.       Expected Outcomes Short Term: Attend rehab on a regular basis to increase amount of physical activity.;Long Term: Add in home exercise to make exercise part of routine and to increase amount of physical activity.;Long Term: Exercising regularly at least 3-5 days a week.       Increase Strength and Stamina  Yes       Intervention Provide advice, education, support and counseling about physical activity/exercise needs.;Develop an individualized exercise prescription for aerobic and resistive training based on initial evaluation findings, risk stratification, comorbidities and participant's personal goals.       Expected Outcomes Short Term: Increase workloads from initial exercise prescription for resistance, speed, and METs.;Short Term: Perform resistance training exercises routinely during rehab and add in resistance training at home;Long Term: Improve cardiorespiratory fitness, muscular endurance and strength as measured by increased METs and functional capacity ( )       Able to understand and use rate of perceived exertion (RPE) scale Yes       Intervention Provide education and explanation on how to use RPE scale       Expected Outcomes Short Term: Able to use RPE daily in rehab to express subjective intensity level;Long Term:  Able to use RPE to guide intensity level when exercising independently       Able to understand and use Dyspnea scale Yes       Intervention Provide education and explanation on how to use Dyspnea scale       Expected Outcomes Short Term:  Able to use Dyspnea scale daily in rehab to express subjective sense of shortness of breath during exertion;Long Term: Able to use Dyspnea scale to guide intensity level when exercising independently       Knowledge and understanding of Target Heart Rate Range (THRR) Yes       Intervention Provide education and explanation of THRR including how the numbers were predicted and where they are located for reference       Expected Outcomes Short Term: Able to state/look up THRR;Long Term: Able to use THRR to govern intensity when exercising independently;Short Term: Able to use daily as guideline for intensity in rehab       Able to check pulse independently Yes       Intervention Provide education and demonstration on how to check pulse in  carotid and radial arteries.;Review the importance of being able to check your own pulse for safety during independent exercise       Expected Outcomes Short Term: Able to explain why pulse checking is important during independent exercise;Long Term: Able to check pulse independently and accurately       Understanding of Exercise Prescription Yes       Intervention Provide education, explanation, and written materials on patient's individual exercise prescription       Expected Outcomes Short Term: Able to explain program exercise prescription;Long Term: Able to explain home exercise prescription to exercise independently              Copy of goals given to participant.

## 2023-07-23 NOTE — Progress Notes (Signed)
 Cardiac Individual Treatment Plan  Patient Details  Name: Samantha Wood MRN: [id number] Date of Birth: 09/04/1945 Referring Provider:   Flowsheet Row CARDIAC REHAB PHASE II ORIENTATION from 07/23/2023 in [facility] CARDIAC REHABILITATION  Referring Provider Alverda Skeans MD       Initial Encounter Date:  Flowsheet Row CARDIAC REHAB PHASE II ORIENTATION from 07/23/2023 in [place] CARDIAC REHABILITATION  Date 07/23/23       Visit Diagnosis: S/P TAVR (transcatheter aortic valve replacement)  Patient's Home Medications on Admission:  Current Outpatient Medications:    acetaminophen (TYLENOL) 650 MG CR tablet, Take 1,300 mg by mouth 2 (two) times daily., Disp: , Rfl:    aspirin EC 81 MG tablet, Take 1 tablet (81 mg total) by mouth daily. Swallow whole., Disp: 90 tablet, Rfl: 1   azithromycin (ZITHROMAX) 500 MG tablet, Take 1 tablet (500 mg total) by mouth as directed. Take one tablet 1 hour before any dental work including cleanings., Disp: 6 tablet, Rfl: 12   calcitRIOL (ROCALTROL) 0.25 MCG capsule, Take 0.25 mcg by mouth 2 (two) times daily. , Disp: , Rfl:    clopidogrel (PLAVIX) 75 MG tablet, Take 1 tablet (75 mg total) by mouth daily., Disp: 90 tablet, Rfl: 6   Cranberry-Vitamin C-Probiotic (AZO CRANBERRY PO), Take 1 tablet by mouth 2 (two) times daily., Disp: , Rfl:    diphenhydrAMINE (BENADRYL) 25 MG tablet, Take 50 mg by mouth at bedtime., Disp: , Rfl:    estradiol (ESTRACE) 1 MG tablet, Take 1 mg by mouth daily. , Disp: , Rfl:    ezetimibe (ZETIA) 10 MG tablet, Take 1 tablet (10 mg total) by mouth daily., Disp: 90 tablet, Rfl: 3   furosemide (LASIX) 20 MG tablet, Take 20 mg by mouth daily as needed for fluid., Disp: , Rfl:    levothyroxine (SYNTHROID) 137 MCG tablet, Take 137 mcg by mouth daily before breakfast., Disp: , Rfl:    lisinopril-hydrochlorothiazide (PRINZIDE,ZESTORETIC) 20-12.5 MG per tablet, Take 1 tablet by mouth daily. , Disp: , Rfl:    LORazepam (ATIVAN) 0.5 MG  tablet, Take 0.125 mg by mouth every 8 (eight) hours as needed for anxiety., Disp: , Rfl:    Multiple Minerals-Vitamins (CALCIUM-MAGNESIUM-ZINC-D3 PO), Take 2 tablets by mouth 2 (two) times daily., Disp: , Rfl:    nitroGLYCERIN (NITROSTAT) 0.4 MG SL tablet, Place 1 tablet (0.4 mg total) under the tongue every 5 (five) minutes as needed for chest pain., Disp: 25 tablet, Rfl: 2   trolamine salicylate (BLUE-EMU HEMP) 10 % cream, Apply 1 Application topically at bedtime., Disp: , Rfl:   Past Medical History: Past Medical History:  Diagnosis Date   (HFpEF) heart failure with preserved ejection fraction (HCC)    Aortic stenosis, moderate    Arthritis    CAD (coronary artery disease) 05/17/2023   s/p PCI/DES to [facility]   CKD stage 3b, GFR 30-44 ml/min (HCC)    Follicular thyroid carcinoma (HCC)    History of chicken pox    Hypertension    Hypocalcemia    Obesity    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 Lynnette Caffey and Dr. Laneta Simmers   Sarcoidosis    Vitiligo     Tobacco Use: Social History   Tobacco Use  Smoking Status Never  Smokeless Tobacco Never    Labs: Review Flowsheet       Latest Ref Rng &  Units 05/17/2023 07/09/2023  Labs for ITP Cardiac and Pulmonary Rehab  PH, Arterial 7.35 - 7.45 7.380  -  PCO2 arterial 32 - 48 mmHg 43.3  -  Bicarbonate 20.0 - 28.0 mmol/L 25.0  25.0  25.6  -  TCO2 22 - 32 mmol/L 26  26  27  27    Acid-base deficit 0.0 - 2.0 mmol/L 1.0  1.0  -  O2 Saturation % 69  75  89  -    Details       Multiple values from one day are sorted in reverse-chronological order         Capillary Blood Glucose: No results found for: "GLUCAP"   Exercise Target Goals: Exercise Program Goal: Individual exercise prescription set using results from initial 6 min walk test and THRR while considering  patient's activity barriers and safety.   Exercise  Prescription Goal: Starting with aerobic activity 30 plus minutes a day, 3 days per week for initial exercise prescription. Provide home exercise prescription and guidelines that participant acknowledges understanding prior to discharge.  Activity Barriers & Risk Stratification:  Activity Barriers & Cardiac Risk Stratification - 07/23/23 0843       Activity Barriers & Cardiac Risk Stratification   Activity Barriers Arthritis;Joint Problems;Left Knee Replacement;Deconditioning;Muscular Weakness;Other (comment);Back Problems;Balance Concerns    Comments arthrisits in R hip and wrist, TKR L knee 2006, occassional back pain by hip    Cardiac Risk Stratification Moderate             6 Minute Walk:  6 Minute Walk     Row Name 07/23/23 0951         6 Minute Walk   Phase Initial     Distance 1238 feet     Walk Time 6 minutes     # of Rest Breaks 0     MPH 2.34     METS 2.53     RPE 15     Perceived Dyspnea  1     VO2 Peak 8.85     Symptoms Yes (comment)     Comments slightly winded, fatgiued at end     Resting HR 64 bpm     Resting BP 126/70     Resting Oxygen Saturation  97 %     Exercise Oxygen Saturation  during 6 min walk 96 %     Max Ex. HR 119 bpm     Max Ex. BP 178/64     2 Minute Post BP 128/70              Oxygen Initial Assessment:   Oxygen Re-Evaluation:   Oxygen Discharge (Final Oxygen Re-Evaluation):   Initial Exercise Prescription:  Initial Exercise Prescription - 07/23/23 0900       Date of Initial Exercise RX and Referring Provider   Date 07/23/23    Referring Provider Alverda Skeans MD      Oxygen   Maintain Oxygen Saturation 88% or higher      Treadmill   MPH 1.5    Grade 0.5    Minutes 15    METs 2.25      NuStep   Level 1    SPM 80    Minutes 15    METs 2.5      Prescription Details   Frequency (times per week) 2    Duration Progress to 30 minutes of continuous aerobic without signs/symptoms of physical distress       Intensity   THRR 40-80% of  Max Heartrate [phone]    Ratings of Perceived Exertion 11-13    Perceived Dyspnea 0-4      Progression   Progression Continue to progress workloads to maintain intensity without signs/symptoms of physical distress.      Resistance Training   Training Prescription Yes    Weight 3 lb    Reps 10-15             Perform Capillary Blood Glucose checks as needed.  Exercise Prescription Changes:   Exercise Prescription Changes     Row Name 07/23/23 0900             Response to Exercise   Blood Pressure (Admit) 126/70       Blood Pressure (Exercise) 178/64       Blood Pressure (Exit) 128/70       Heart Rate (Admit) 64 bpm       Heart Rate (Exercise) 119 bpm       Heart Rate (Exit) 68 bpm       Oxygen Saturation (Admit) 97 %       Oxygen Saturation (Exercise) 96 %       Rating of Perceived Exertion (Exercise) 15       Perceived Dyspnea (Exercise) 1       Symptoms slightly SOB, fatigued at end       Comments walk test results                Exercise Comments:   Exercise Goals and Review:   Exercise Goals     Row Name 07/23/23 0953             Exercise Goals   Increase Physical Activity Yes       Intervention Provide advice, education, support and counseling about physical activity/exercise needs.;Develop an individualized exercise prescription for aerobic and resistive training based on initial evaluation findings, risk stratification, comorbidities and participant's personal goals.       Expected Outcomes Short Term: Attend rehab on a regular basis to increase amount of physical activity.;Long Term: Add in home exercise to make exercise part of routine and to increase amount of physical activity.;Long Term: Exercising regularly at least 3-5 days a week.       Increase Strength and Stamina Yes       Intervention Provide advice, education, support and counseling about physical activity/exercise needs.;Develop an individualized  exercise prescription for aerobic and resistive training based on initial evaluation findings, risk stratification, comorbidities and participant's personal goals.       Expected Outcomes Short Term: Increase workloads from initial exercise prescription for resistance, speed, and METs.;Short Term: Perform resistance training exercises routinely during rehab and add in resistance training at home;Long Term: Improve cardiorespiratory fitness, muscular endurance and strength as measured by increased METs and functional capacity ( )       Able to understand and use rate of perceived exertion (RPE) scale Yes       Intervention Provide education and explanation on how to use RPE scale       Expected Outcomes Short Term: Able to use RPE daily in rehab to express subjective intensity level;Long Term:  Able to use RPE to guide intensity level when exercising independently       Able to understand and use Dyspnea scale Yes       Intervention Provide education and explanation on how to use Dyspnea scale       Expected Outcomes Short Term: Able to use Dyspnea scale daily in  rehab to express subjective sense of shortness of breath during exertion;Long Term: Able to use Dyspnea scale to guide intensity level when exercising independently       Knowledge and understanding of Target Heart Rate Range (THRR) Yes       Intervention Provide education and explanation of THRR including how the numbers were predicted and where they are located for reference       Expected Outcomes Short Term: Able to state/look up THRR;Long Term: Able to use THRR to govern intensity when exercising independently;Short Term: Able to use daily as guideline for intensity in rehab       Able to check pulse independently Yes       Intervention Provide education and demonstration on how to check pulse in carotid and radial arteries.;Review the importance of being able to check your own pulse for safety during independent exercise       Expected  Outcomes Short Term: Able to explain why pulse checking is important during independent exercise;Long Term: Able to check pulse independently and accurately       Understanding of Exercise Prescription Yes       Intervention Provide education, explanation, and written materials on patient's individual exercise prescription       Expected Outcomes Short Term: Able to explain program exercise prescription;Long Term: Able to explain home exercise prescription to exercise independently                Exercise Goals Re-Evaluation :    Discharge Exercise Prescription (Final Exercise Prescription Changes):  Exercise Prescription Changes - 07/23/23 0900       Response to Exercise   Blood Pressure (Admit) 126/70    Blood Pressure (Exercise) 178/64    Blood Pressure (Exit) 128/70    Heart Rate (Admit) 64 bpm    Heart Rate (Exercise) 119 bpm    Heart Rate (Exit) 68 bpm    Oxygen Saturation (Admit) 97 %    Oxygen Saturation (Exercise) 96 %    Rating of Perceived Exertion (Exercise) 15    Perceived Dyspnea (Exercise) 1    Symptoms slightly SOB, fatigued at end    Comments walk test results             Nutrition:  Target Goals: Understanding of nutrition guidelines, daily intake of sodium 1500mg , cholesterol 200mg , calories 30% from fat and 7% or less from saturated fats, daily to have 5 or more servings of fruits and vegetables.  Biometrics:  Pre Biometrics - 07/23/23 0953       Pre Biometrics   Height 5\' 3"  (1.6 m)    Weight 90.8 kg    Waist Circumference 35 inches    Hip Circumference 46.5 inches    Waist to Hip Ratio 0.75 %    BMI (Calculated) 35.47    Grip Strength 17.1 kg    Single Leg Stand 4.8 seconds              Nutrition Therapy Plan and Nutrition Goals:  Nutrition Therapy & Goals - 07/23/23 0959       Intervention Plan   Intervention Prescribe, educate and counsel regarding individualized specific dietary modifications aiming towards targeted core  components such as weight, hypertension, lipid management, diabetes, heart failure and other comorbidities.;Nutrition handout(s) given to patient.    Expected Outcomes Short Term Goal: Understand basic principles of dietary content, such as calories, fat, sodium, cholesterol and nutrients.;Long Term Goal: Adherence to prescribed nutrition plan.  Nutrition Assessments:  MEDIFICTS Score Key: >=70 Need to make dietary changes  40-70 Heart Healthy Diet <= 40 Therapeutic Level Cholesterol Diet  Flowsheet Row CARDIAC REHAB PHASE II ORIENTATION from 07/23/2023 in [facility] CARDIAC RE[facility]ABILITATION  Picture Your Plate Total Score on Admission 59      Picture Your Plate Scores: <82 Unhealthy dietary pattern with much room for improvement. 41-50 Dietary pattern unlikely to meet recommendations for good health and room for improvement. 51-60 More healthful dietary pattern, with some room for improvement.  >60 Healthy dietary pattern, although there may be some specific behaviors that could be improved.    Nutrition Goals Re-Evaluation:   Nutrition Goals Discharge (Final Nutrition Goals Re-Evaluation):   Psychosocial: Target Goals: Acknowledge presence or absence of significant depression and/or stress, maximize coping skills, provide positive support system. Participant is able to verbalize types and ability to use techniques and skills needed for reducing stress and depression.  Initial Review & Psychosocial Screening:  Initial Psych Review & Screening - 07/23/23 0846       Initial Review   Current issues with Current Psychotropic Meds;Current Stress Concerns    Source of Stress Concerns Chronic Illness;Unable to participate in former interests or hobbies;Unable to perform yard/household activities    Comments has lorapam for anixety uses in small doses as needed      Family Dynamics   Good Support System? Yes   Willodean Rosenthal ([place]), daughter in law Clydie Braun (leans on her too)    Concerns Recent loss of child    Comments Only son passed last February      Barriers   Psychosocial barriers to participate in program The patient should benefit from training in stress management and relaxation.;Psychosocial barriers identified (see note)      Screening Interventions   Interventions Encouraged to exercise;Provide feedback about the scores to participant;To provide support and resources with identified psychosocial needs    Expected Outcomes Short Term goal: Utilizing psychosocial counselor, staff and physician to assist with identification of specific Stressors or current issues interfering with healing process. Setting desired goal for each stressor or current issue identified.;Long Term Goal: Stressors or current issues are controlled or eliminated.;Short Term goal: Identification and review with participant of any Quality of Life or Depression concerns found by scoring the questionnaire.;Long Term goal: The participant improves quality of Life and PHQ9 Scores as seen by post scores and/or verbalization of changes             Quality of Life Scores:  Quality of Life - 07/23/23 0958       Quality of Life   Select Quality of Life      Quality of Life Scores   Health/Function Pre 29.6 %    Socioeconomic Pre 30 %    Psych/Spiritual Pre 30 %    Family Pre 30 %    GLOBAL Pre 29.83 %            Scores of 19 and below usually indicate a poorer quality of life in these areas.  A difference of  2-3 points is a clinically meaningful difference.  A difference of 2-3 points in the total score of the Quality of Life Index has been associated with significant improvement in overall quality of life, self-image, physical symptoms, and general health in studies assessing change in quality of life.  PHQ-9: Review Flowsheet       07/23/2023  Depression screen PHQ 2/9  Decreased Interest 0  Down, Depressed, Hopeless 0  PHQ - 2 Score 0  Altered sleeping 0  Tired,  decreased energy 0  Change in appetite 0  Feeling bad or failure about yourself  0  Trouble concentrating 0  Moving slowly or fidgety/restless 0  Suicidal thoughts 0  PHQ-9 Score 0  Difficult doing work/chores Not difficult at all   Interpretation of Total Score  Total Score Depression Severity:  1-4 = Minimal depression, 5-9 = Mild depression, 10-14 = Moderate depression, 15-19 = Moderately severe depression, 20-27 = Severe depression   Psychosocial Evaluation and Intervention:  Psychosocial Evaluation - 07/23/23 0954       Psychosocial Evaluation & Interventions   Interventions Stress management education;Relaxation education;Encouraged to exercise with the program and follow exercise prescription    Comments Darel Hong is coming into cardiac rehab after a TAVR.  She also had a stent placed back in December and was unable to start rehab due to TAVR workup. She is eager to get going and has begun to regain her strength already. She wants to get all her strength back and to feel better overall. She lives with her friend Homero Fellers and relies heavily on him.  She does have a history of anxiety but tires to only take a 1/4 tab of lorazapam at a time.  She usually sleeps well but on occassion will wake at night and stress over the loss of her son again and then she will take a full dose to get back to sleep.  She lost her son a year ago in February.  It was unexpected and still hard on both her and her daughter in law.  They lean on each other for support.  She and Homero Fellers go dancing every week and weekend at the [facility] for exercise and to get out and about.  She is eager to get dancing without stopping again.  She also enjoys wood turning and painting in her down time.  She has no barriers to attending rehab and plans to come twice a week.    Expected Outcomes Short; Attend rehab regularly to build up stamina Long: Able to continue to go dancing every week    Continue Psychosocial Services  Follow up required by  staff             Psychosocial Re-Evaluation:   Psychosocial Discharge (Final Psychosocial Re-Evaluation):   Vocational Rehabilitation: Provide vocational rehab assistance to qualifying candidates.   Vocational Rehab Evaluation & Intervention:  Vocational Rehab - 07/23/23 0848       Initial Vocational Rehab Evaluation & Intervention   Assessment shows need for Vocational Rehabilitation No   retired            Education: Education Goals: Education classes will be provided on a weekly basis, covering required topics. Participant will state understanding/return demonstration of topics presented.  Learning Barriers/Preferences:  Learning Barriers/Preferences - 07/23/23 0844       Learning Barriers/Preferences   Learning Barriers Sight   glasses for reading   Learning Preferences None             Education Topics: Hypertension, Hypertension Reduction -Define heart disease and high blood pressure. Discus how high blood pressure affects the body and ways to reduce high blood pressure.   Exercise and Your Heart -Discuss why it is important to exercise, the FITT principles of exercise, normal and abnormal responses to exercise, and how to exercise safely.   Angina -Discuss definition of angina, causes of angina, treatment of angina, and how to decrease risk of  having angina.   Cardiac Medications -Review what the following cardiac medications are used for, how they affect the body, and side effects that may occur when taking the medications.  Medications include Aspirin, Beta blockers, calcium channel blockers, ACE Inhibitors, angiotensin receptor blockers, diuretics, digoxin, and antihyperlipidemics.   Congestive Heart Failure -Discuss the definition of CHF, how to live with CHF, the signs and symptoms of CHF, and how keep track of weight and sodium intake.   Heart Disease and Intimacy -Discus the effect sexual activity has on the heart, how changes occur  during intimacy as we age, and safety during sexual activity.   Smoking Cessation / COPD -Discuss different methods to quit smoking, the health benefits of quitting smoking, and the definition of COPD.   Nutrition I: Fats -Discuss the types of cholesterol, what cholesterol does to the heart, and how cholesterol levels can be controlled.   Nutrition II: Labels -Discuss the different components of food labels and how to read food label   Heart Parts/Heart Disease and PAD -Discuss the anatomy of the heart, the pathway of blood circulation through the heart, and these are affected by heart disease.   Stress I: Signs and Symptoms -Discuss the causes of stress, how stress may lead to anxiety and depression, and ways to limit stress.   Stress II: Relaxation -Discuss different types of relaxation techniques to limit stress.   Warning Signs of Stroke / TIA -Discuss definition of a stroke, what the signs and symptoms are of a stroke, and how to identify when someone is having stroke.   Knowledge Questionnaire Score:  Knowledge Questionnaire Score - 07/23/23 0959       Knowledge Questionnaire Score   Pre Score 22/26             Core Components/Risk Factors/Patient Goals at Admission:  Personal Goals and Risk Factors at Admission - 07/23/23 0959       Core Components/Risk Factors/Patient Goals on Admission    Weight Management Yes;Obesity;Weight Loss    Intervention Weight Management: Develop a combined nutrition and exercise program designed to reach desired caloric intake, while maintaining appropriate intake of nutrient and fiber, sodium and fats, and appropriate energy expenditure required for the weight goal.;Weight Management: Provide education and appropriate resources to help participant work on and attain dietary goals.;Weight Management/Obesity: Establish reasonable short term and long term weight goals.;Obesity: Provide education and appropriate resources to help  participant work on and attain dietary goals.    Admit Weight 200 lb 3.2 oz (90.8 kg)    Goal Weight: Short Term 195 lb (88.5 kg)    Goal Weight: Long Term 190 lb (86.2 kg)    Expected Outcomes Short Term: Continue to assess and modify interventions until short term weight is achieved;Long Term: Adherence to nutrition and physical activity/exercise program aimed toward attainment of established weight goal;Weight Loss: Understanding of general recommendations for a balanced deficit meal plan, which promotes 1-2 lb weight loss per week and includes a negative energy balance of [phone] kcal/d;Understanding recommendations for meals to include 15-35% energy as protein, 25-35% energy from fat, 35-60% energy from carbohydrates, less than 200mg  of dietary cholesterol, 20-35 gm of total fiber daily;Understanding of distribution of calorie intake throughout the day with the consumption of 4-5 meals/snacks    Hypertension Yes    Intervention Provide education on lifestyle modifcations including regular physical activity/exercise, weight management, moderate sodium restriction and increased consumption of fresh fruit, vegetables, and low fat dairy, alcohol moderation, and smoking cessation.;Monitor prescription  use compliance.    Expected Outcomes Short Term: Continued assessment and intervention until BP is < 140/21mm HG in hypertensive participants. < 130/53mm HG in hypertensive participants with diabetes, heart failure or chronic kidney disease.;Long Term: Maintenance of blood pressure at goal levels.    Lipids Yes    Intervention Provide education and support for participant on nutrition & aerobic/resistive exercise along with prescribed medications to achieve LDL 70mg , HDL >40mg .    Expected Outcomes Short Term: Participant states understanding of desired cholesterol values and is compliant with medications prescribed. Participant is following exercise prescription and nutrition guidelines.;Long Term:  Cholesterol controlled with medications as prescribed, with individualized exercise RX and with personalized nutrition plan. Value goals: LDL < 70mg , HDL > 40 mg.             Core Components/Risk Factors/Patient Goals Review:    Core Components/Risk Factors/Patient Goals at Discharge (Final Review):    ITP Comments:  ITP Comments     Row Name 07/23/23 0951           ITP Comments Patient attend orientation today.  Patient is attending Cardiac Rehabilitation Program.  Documentation for diagnosis [facility] be found in [facility] 07/10/[facility].  Reviewed medical chart, RPE/RPD, gym safety and program guidelines.  Patient was fitted to equipment they will be using during rehab.  Patient is scheduled to start exercise on Thursday 07/25/23 at 1330.   Initial ITP created and sent for review and signature by Dr. Dina Rich, Medical Director for Cardiac Rehabilitation Program.                Comments: Initial ITP

## 2023-07-25 ENCOUNTER — Encounter (HOSPITAL_COMMUNITY)
Admission: RE | Admit: 2023-07-25 | Discharge: 2023-07-25 | Disposition: A | Discharge status: Home / Self Care | Source: Ambulatory Visit | Attending: Internal Medicine | Admitting: Internal Medicine

## 2023-07-25 DIAGNOSIS — Z952 Presence of prosthetic heart valve: Secondary | ICD-10-CM | POA: Diagnosis not present

## 2023-07-25 NOTE — Progress Notes (Signed)
 Daily Session Note  Patient Details  Name: Samantha Wood MRN: [id number] Date of Birth: 1945-10-23 Referring Provider:   Flowsheet Row CARDIAC REHAB PHASE II ORIENTATION from 07/23/2023 in [facility] CARDIAC REHABILITATION  Referring Provider Alverda Skeans MD       Encounter Date: 07/25/2023  Check In:  Session Check In - 07/25/23 1335       Check-In   Supervising physician immediately available to respond to emergencies See telemetry face sheet for immediately available MD    Location [facility]    Staff Present Ross Ludwig, BS, Exercise Physiologist;Juvia Aerts Juanetta Gosling, MA, RCEP, CCRP, CCET;Hillary Troutman BSN, RN    Virtual Visit No    Medication changes reported     No    Fall or balance concerns reported    No    Warm-up and Cool-down Performed on first and last piece of equipment    Resistance Training Performed Yes    VAD Patient? No    PAD/SET Patient? No      Pain Assessment   Currently in Pain? No/denies             Capillary Blood Glucose: No results found for this or any previous visit (from the past 24 hours).    Social History   Tobacco Use  Smoking Status Never  Smokeless Tobacco Never    Goals Met:  Exercise tolerated well Personal goals reviewed No report of concerns or symptoms today Strength training completed today  Goals Unmet:  Not Applicable  Comments: First full day of exercise!  Patient was oriented to gym and equipment including functions, settings, policies, and procedures.  Patient's individual exercise prescription and treatment plan were reviewed.  All starting workloads were established based on the results of the 6 minute walk test done at initial orientation visit.  The plan for exercise progression was also introduced and progression will be customized based on patient's performance and goals.

## 2023-07-30 ENCOUNTER — Encounter (HOSPITAL_COMMUNITY)
Admission: RE | Admit: 2023-07-30 | Discharge: 2023-07-30 | Disposition: A | Discharge status: Home / Self Care | Source: Ambulatory Visit | Attending: Internal Medicine

## 2023-07-30 DIAGNOSIS — Z952 Presence of prosthetic heart valve: Secondary | ICD-10-CM | POA: Diagnosis not present

## 2023-07-30 NOTE — Progress Notes (Signed)
 Daily Session Note  Patient Details  Name: AVONLEA SIMA MRN: [id number] Date of Birth: 01/10/1946 Referring Provider:   Flowsheet Row CARDIAC REHAB PHASE II ORIENTATION from 07/23/2023 in [facility] CARDIAC REHABILITATION  Referring Provider Alverda Skeans MD       Encounter Date: 07/30/2023  Check In:  Session Check In - 07/30/23 1330       Check-In   Supervising physician immediately available to respond to emergencies See telemetry face sheet for immediately available MD    Location [facility]    Staff Present Ross Ludwig, BS, Exercise Physiologist;Jessica Juanetta Gosling, MA, RCEP, CCRP, CCET;Brittany Roseanne Reno, BSN, RN, WTA-C;Wing Schoch, RN    Virtual Visit No    Medication changes reported     No    Fall or balance concerns reported    No    Warm-up and Cool-down Performed on first and last piece of equipment    Resistance Training Performed Yes    VAD Patient? No    PAD/SET Patient? No      Pain Assessment   Currently in Pain? No/denies    Pain Score 0-No pain    Multiple Pain Sites No             Capillary Blood Glucose: No results found for this or any previous visit (from the past 24 hours).    Social History   Tobacco Use  Smoking Status Never  Smokeless Tobacco Never    Goals Met:  Independence with exercise equipment Exercise tolerated well No report of concerns or symptoms today Strength training completed today  Goals Unmet:  Not Applicable  Comments: Pt able to follow exercise prescription today without complaint.  Will continue to monitor for progression.

## 2023-08-01 ENCOUNTER — Encounter (HOSPITAL_COMMUNITY)
Admission: RE | Admit: 2023-08-01 | Discharge: 2023-08-01 | Disposition: A | Discharge status: Home / Self Care | Source: Ambulatory Visit | Attending: Internal Medicine | Admitting: Internal Medicine

## 2023-08-01 DIAGNOSIS — Z952 Presence of prosthetic heart valve: Secondary | ICD-10-CM | POA: Diagnosis not present

## 2023-08-01 NOTE — Progress Notes (Signed)
 Daily Session Note  Patient Details  Name: Samantha Wood MRN: [id number] Date of Birth: Jan 28, 1946 Referring Provider:   Flowsheet Row CARDIAC REHAB PHASE II ORIENTATION from 07/23/2023 in [facility] CARDIAC REHABILITATION  Referring Provider Alverda Skeans MD       Encounter Date: 08/01/2023  Check In:  Session Check In - 08/01/23 1328       Check-In   Supervising physician immediately available to respond to emergencies See telemetry face sheet for immediately available MD    Location [facility]    Staff Present Ross Ludwig, BS, Exercise Physiologist;Hillary [place] BSN, RN;Marvyn Torrez [place], MA, RCEP, CCRP, CCET    Virtual Visit No    Medication changes reported     No    Fall or balance concerns reported    No    Warm-up and Cool-down Performed on first and last piece of equipment    Resistance Training Performed Yes    VAD Patient? No    PAD/SET Patient? No      Pain Assessment   Currently in Pain? No/denies             Capillary Blood Glucose: No results found for this or any previous visit (from the past 24 hours).    Social History   Tobacco Use  Smoking Status Never  Smokeless Tobacco Never    Goals Met:  Exercise tolerated well No report of concerns or symptoms today Strength training completed today  Goals Unmet:  Not Applicable  Comments: Pt able to follow exercise prescription today without complaint.  Will continue to monitor for progression.

## 2023-08-06 ENCOUNTER — Encounter (HOSPITAL_COMMUNITY)
Admission: RE | Admit: 2023-08-06 | Discharge: 2023-08-06 | Disposition: A | Discharge status: Home / Self Care | Source: Ambulatory Visit | Attending: Internal Medicine | Admitting: Internal Medicine

## 2023-08-06 DIAGNOSIS — Z952 Presence of prosthetic heart valve: Secondary | ICD-10-CM | POA: Diagnosis not present

## 2023-08-06 NOTE — Progress Notes (Unsigned)
 [facility]   MULTIDISCIPLINARY HEART VALVE CLINIC                                     Cardiology Office Note:    Date:  08/09/2023   ID:  Samantha Wood, DOB 1945-12-04, MRN [id number]  PCP:  Richardean Chimera, MD  [facility] Cardiologist:  Orbie Pyo, MD  [facility] Electrophysiologist:  None   Referring MD: Richardean Chimera, MD   1 month s/p TAVR  History of Present Illness:    Samantha Wood is a 78 y.o. female with a hx of obesity (BMI 31), follicular thyroid cancer s/p resection (2013), sarcoidosis, HTN, HLD, CKD stage IIIb, OA, CAD s/p DES/PCI to mRCA (05/17/23) and moderate aortic stenosis s/p TAVR (05/16/24) though the PROGRESS CAP trial who presents to clinic for follow up.   She was seen initially by Dr. Lynnette Caffey in 10/2022 concerning her aortic stenosis. Echocardiogram at that time showed moderate aortic stenosis with normal left ventricular systolic function. She decided that she did not want to proceed with further workup. She was seen back in 04/2023 and echo showed EF 55%, and moderate AS with a mean grad 34 mmHg, AVA 0.98 cm2, DVI 0.33, SVI 45, and mild MR/MS. She reported being more symptomatic with exertional fatigue, shortness of breath, and chest tightness. She subsequently underwent cardiac catheterization on 05/17/2023 which showed a 90% mid RCA stenosis that was successfully stented. She was started on dual antiplatelet therapy.  Her PA pressure was 50/25 with a mean of 34 and a wedge pressure of 27 with V waves up to 34 mmHg.  She was started on Lasix 20 mg daily due to elevated filling pressures. Later this was discontinued due to worsening renal function. S/p TAVR with a 26 mm Edwards Sapien 3 Ultra Resilia THV via the TF approach on 07/10/23 through the Progress CAP trial. Post op echo showed EF 60%, normally functioning TAVR with a mean gradient of 8 mmHg and no PVL as well as severe MAC with mild MS.   Today the patient presents to clinic for  follow up. She is working with cardiac rehab and has a hard time on the treadmill. Hip limits her and has some mild SOB. No CP. No LE edema, orthopnea or PND. No dizziness or syncope. No blood in stool or urine. No palpitations. She thinks she has had an increase in exercise tolerance but hard to tell because she wasn't as active prior to surgery.  She and her husband go dancing at the [place] in [place].   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 Lynnette Caffey and Dr. Laneta Simmers   Sarcoidosis    Vitiligo      Current Medications: Current Meds  Medication Sig   acetaminophen (TYLENOL) 650 MG CR tablet Take 1,300 mg  by mouth 2 (two) times daily.   aspirin EC 81 MG tablet Take 1 tablet (81 mg total) by mouth daily. Swallow whole.   azithromycin (ZITHROMAX) 500 MG tablet Take 1 tablet (500 mg total) by mouth as directed. Take one tablet 1 hour before any dental work including cleanings.   calcitRIOL (ROCALTROL) 0.25 MCG capsule Take 0.25 mcg by mouth 2 (two) times daily.    clopidogrel (PLAVIX) 75 MG tablet Take 1 tablet (75 mg total) by mouth daily.   Cranberry-Vitamin C-Probiotic (AZO CRANBERRY PO) Take 1 tablet by mouth 2 (two) times daily.   diphenhydrAMINE (BENADRYL) 25 MG tablet Take 50 mg by mouth at bedtime.   estradiol (ESTRACE) 1 MG tablet Take 1 mg by mouth daily.    ezetimibe (ZETIA) 10 MG tablet Take 1 tablet (10 mg total) by mouth daily.   furosemide (LASIX) 20 MG tablet Take 20 mg by mouth daily as needed for fluid.    levothyroxine (SYNTHROID) 137 MCG tablet Take 137 mcg by mouth daily before breakfast.   lisinopril-hydrochlorothiazide (PRINZIDE,ZESTORETIC) 20-12.5 MG per tablet Take 1 tablet by mouth daily.    LORazepam (ATIVAN) 0.5 MG tablet Take 0.125 mg by mouth every 8 (eight) hours as needed for anxiety.   Multiple Minerals-Vitamins (CALCIUM-MAGNESIUM-ZINC-D3 PO) Take 2 tablets by mouth 2 (two) times daily.   nitroGLYCERIN (NITROSTAT) 0.4 MG SL tablet Place 1 tablet (0.4 mg total) under the tongue every 5 (five) minutes as needed for chest pain.   trolamine salicylate (BLUE-EMU HEMP) 10 % cream Apply 1 Application topically at bedtime.      ROS:   Please see the history of present illness.    All other systems reviewed and are negative.  EKGs       Risk Assessment/Calculations:           Physical Exam:    VS:  BP 120/60   Pulse 67   Ht 5\' 3"  (1.6 m)   Wt 199 lb 3.2 oz (90.4 kg)   SpO2 98%   BMI 35.29 kg/m     Wt Readings from Last 3 Encounters:  08/09/23 199 lb 3.2 oz (90.4 kg)  07/23/23 200 lb 3.2 oz (90.8 kg)  07/17/23 199 lb 3.2 oz (90.4 kg)     GEN: Well nourished, well developed in no acute distress NECK: No JVD CARDIAC: RRR, soft flow murmur @ RUSB. No rubs, gallops RESPIRATORY:  Clear to auscultation without rales, wheezing or rhonchi  ABDOMEN: Soft, non-tender, non-distended EXTREMITIES:  No edema; No deformity  ASSESSMENT:    1. S/P TAVR (transcatheter aortic valve replacement through PROGRESS CAP TRIAL   2. Coronary artery disease involving native coronary artery of native heart without angina pectoris   3. Chronic heart failure with preserved ejection fraction (HCC)   4. Stage 3b chronic kidney disease (HCC)   5. Primary hypertension   6. Pulmonary nodule   7. Bile duct abnormality     PLAN:    In order of problems listed above:  Moderate AS s/p TAVR (through Progress CAP trial):  -- Echo today shows EF 55%, normally functioning TAVR with a mean  gradient of 10.5 mm hg and no PVL as well as moderate MAC and mod TR. -- NYHA II. Still has some mild SOB with cardio at cardiac rehab.  -- CCS class I -- SBE prophylaxis discussed; I have RX'd azithromycin due to a PCN allergy.  -- Continue Asprin 81mg  daily and Plavix 75mg  daily.  -- I will see  back for 1 year echo and OV.   CAD:  -- S/p DES/PCI to mRCA (05/17/23).  -- Plan DAPT with aspirin 81mg  daily and Plavix 6 months followed by indefinite Plavix monotherapy. -- Stop aspirin 81mg  daily on 11/15/23. -- She declines statin Rx, but was willing to take Zetia.  -- Continue Zetia 10mg  daily.   Chronic HFpEF:  -- Appears euvolemic.  -- Continue Lisinopril-HCT 20-12.5mg   -- Okay to use PRN 20mg  Lasix - has not had to use this since 1st week post op.    CKD stage IIIb:  -- Creat 1.21 on labs 07/17/23   HTN:  -- BP 120/70 today  -- Continue Lisinopril-HCT 20-12.5mg   Pulmonary nodule:  -- Pre TAVR CT showed an "approximately 8 mm subpleural pulmonary nodule along the lateral aspect of the left upper lobe. Non-contrast chest CT at 6-12 months is recommended. If the nodule is stable at time of repeat CT, then future CT at 18-24 months (from today's scan) is considered optional for low-risk patients, but is recommended for high-risk patients." -- Given history of follicular thyroid cancer, I will get a non contrast CT set up in 12/2023.    Biliary ductal dilation:  -- Pre TAVR CT showed a "biliary ductal dilatation without visible choledocholithiasis or obstructing mass. The gallbladder is surgically absent. Findings may be normal given patient age and prior cholecystectomy status. Recommend correlation with serum LFTs and bilirubin."  -- PAT labs showed normal LFTs and bilirubin.  -- No further work up necessary.   Medication Adjustments/Labs and Tests Ordered: Current medicines are reviewed at length with the patient today.  Concerns regarding medicines are outlined above.  Orders Placed  This Encounter  Procedures   CT CHEST WO CONTRAST   No orders of the defined types were placed in this encounter.   Patient Instructions  Medication Instructions:  Your physician has recommended you make the following change in your medication:  1) On June 27th STOP taking Aspirin *If you need a refill on your cardiac medications before your next appointment, please call your pharmacy*   Testing/Procedures: Chest CT - August 2025 Non-Cardiac CT scanning, (CAT scanning), is a noninvasive, special x-ray that produces cross-sectional images of the body using x-rays and a computer. CT scans help physicians diagnose and treat medical conditions. For some CT exams, a contrast material is used to enhance visibility in the area of the body being studied. CT scans provide greater clarity and reveal more details than regular x-ray exams.  Follow-Up: At [facility], you and your health needs are our priority.  As part of our continuing mission to provide you with exceptional heart care, we have created designated Provider Care Teams.  These Care Teams include your primary Cardiologist (physician) and Advanced Practice Providers (APPs -  Physician Assistants and Nurse Practitioners) who all work together to provide you with the care you need, when you need it.  Your next appointment:   As scheduled       Signed, Cline Crock, PA-C  08/09/2023 12:04 PM    [facility]

## 2023-08-06 NOTE — Progress Notes (Signed)
 Daily Session Note  Patient Details  Name: Samantha Wood MRN: [id number] Date of Birth: 03-01-1946 Referring Provider:   Flowsheet Row CARDIAC REHAB PHASE II ORIENTATION from 07/23/2023 in [facility] CARDIAC REHABILITATION  Referring Provider Alverda Skeans MD       Encounter Date: 08/06/2023  Check In:  Session Check In - 08/06/23 1339       Check-In   Supervising physician immediately available to respond to emergencies See telemetry face sheet for immediately available MD    Location [facility]    Staff Present Ross Ludwig, BS, Exercise Physiologist;Brittany Roseanne Reno, BSN, RN, WTA-C;Zymir Napoli, RN;Jessica [place], MA, RCEP, CCRP, CCET    Virtual Visit No    Medication changes reported     No    Fall or balance concerns reported    No    Warm-up and Cool-down Performed on first and last piece of equipment    Resistance Training Performed Yes    VAD Patient? No    PAD/SET Patient? No      Pain Assessment   Currently in Pain? No/denies    Pain Score 0-No pain    Multiple Pain Sites No             Capillary Blood Glucose: No results found for this or any previous visit (from the past 24 hours).    Social History   Tobacco Use  Smoking Status Never  Smokeless Tobacco Never    Goals Met:  Independence with exercise equipment Exercise tolerated well No report of concerns or symptoms today Strength training completed today  Goals Unmet:  Not Applicable  Comments: Pt able to follow exercise prescription today without complaint.  Will continue to monitor for progression.

## 2023-08-08 ENCOUNTER — Encounter (HOSPITAL_COMMUNITY)
Admission: RE | Admit: 2023-08-08 | Discharge: 2023-08-08 | Disposition: A | Discharge status: Home / Self Care | Source: Ambulatory Visit | Attending: Internal Medicine | Admitting: Internal Medicine

## 2023-08-08 DIAGNOSIS — Z952 Presence of prosthetic heart valve: Secondary | ICD-10-CM | POA: Diagnosis not present

## 2023-08-08 NOTE — Progress Notes (Signed)
 Daily Session Note  Patient Details  Name: Samantha Wood MRN: [id number] Date of Birth: [birth year] Referring Provider:   Flowsheet Row CARDIAC REHAB PHASE II ORIENTATION from 07/23/2023 in [facility] CARDIAC REHABILITATION  Referring Provider Alverda Skeans MD       Encounter Date: 08/08/2023  Check In:  Session Check In - 08/08/23 1315       Check-In   Supervising physician immediately available to respond to emergencies See telemetry face sheet for immediately available MD    Location [facility]    Staff Present Avanell Shackleton BSN, RN;Heather Fredric Mare, BS, Exercise Physiologist;Jessica [place], MA, RCEP, CCRP, Dow Adolph, RN, BSN    Virtual Visit No    Medication changes reported     No    Fall or balance concerns reported    No    Tobacco Cessation No Change    Warm-up and Cool-down Performed on first and last piece of equipment    Resistance Training Performed Yes    VAD Patient? No    PAD/SET Patient? No      Pain Assessment   Currently in Pain? No/denies    Pain Score 0-No pain    Multiple Pain Sites No             Capillary Blood Glucose: No results found for this or any previous visit (from the past 24 hours).    Social History   Tobacco Use  Smoking Status Never  Smokeless Tobacco Never    Goals Met:  Independence with exercise equipment Exercise tolerated well No report of concerns or symptoms today Strength training completed today  Goals Unmet:  Not Applicable  Comments: .Pt able to follow exercise prescription today without complaint.  Will continue to monitor for progression.

## 2023-08-09 ENCOUNTER — Encounter

## 2023-08-09 ENCOUNTER — Ambulatory Visit: Payer: Medicare Other | Attending: Cardiology

## 2023-08-09 ENCOUNTER — Ambulatory Visit: Payer: Medicare Other

## 2023-08-09 VITALS — BP 120/60 | HR 67 | Resp 18

## 2023-08-09 VITALS — BP 120/60 | HR 67 | Ht 63.0 in | Wt 199.2 lb

## 2023-08-09 DIAGNOSIS — N1832 Chronic kidney disease, stage 3b: Secondary | ICD-10-CM | POA: Diagnosis not present

## 2023-08-09 DIAGNOSIS — I2510 Atherosclerotic heart disease of native coronary artery without angina pectoris: Secondary | ICD-10-CM | POA: Diagnosis not present

## 2023-08-09 DIAGNOSIS — I10 Essential (primary) hypertension: Secondary | ICD-10-CM | POA: Diagnosis not present

## 2023-08-09 DIAGNOSIS — I5032 Chronic diastolic (congestive) heart failure: Secondary | ICD-10-CM | POA: Insufficient documentation

## 2023-08-09 DIAGNOSIS — K839 Disease of biliary tract, unspecified: Secondary | ICD-10-CM

## 2023-08-09 DIAGNOSIS — Z952 Presence of prosthetic heart valve: Secondary | ICD-10-CM | POA: Insufficient documentation

## 2023-08-09 DIAGNOSIS — R911 Solitary pulmonary nodule: Secondary | ICD-10-CM

## 2023-08-09 DIAGNOSIS — Z006 Encounter for examination for normal comparison and control in clinical research program: Secondary | ICD-10-CM

## 2023-08-09 LAB — ECHOCARDIOGRAM COMPLETE
AR max vel: 2.25 cm2
AV Area VTI: 2.71 cm2
AV Area mean vel: 1.98 cm2
AV Mean grad: 10.5 mmHg
AV Peak grad: 20.5 mmHg
Ao pk vel: 2.27 m/s
Area-P 1/2: 2.32 cm2
S' Lateral: 3.2 cm

## 2023-08-09 NOTE — Research (Signed)
 Progress CAP 30 Day Post TAVR   Current Outpatient Medications:    acetaminophen (TYLENOL) 650 MG CR tablet, Take 1,300 mg by mouth 2 (two) times daily., Disp: , Rfl:    aspirin EC 81 MG tablet, Take 1 tablet (81 mg total) by mouth daily. Swallow whole., Disp: 90 tablet, Rfl: 1   azithromycin (ZITHROMAX) 500 MG tablet, Take 1 tablet (500 mg total) by mouth as directed. Take one tablet 1 hour before any dental work including cleanings., Disp: 6 tablet, Rfl: 12   calcitRIOL (ROCALTROL) 0.25 MCG capsule, Take 0.25 mcg by mouth 2 (two) times daily. , Disp: , Rfl:    clopidogrel (PLAVIX) 75 MG tablet, Take 1 tablet (75 mg total) by mouth daily., Disp: 90 tablet, Rfl: 6   Cranberry-Vitamin C-Probiotic (AZO CRANBERRY PO), Take 1 tablet by mouth 2 (two) times daily., Disp: , Rfl:    diphenhydrAMINE (BENADRYL) 25 MG tablet, Take 50 mg by mouth at bedtime., Disp: , Rfl:    estradiol (ESTRACE) 1 MG tablet, Take 1 mg by mouth daily. , Disp: , Rfl:    ezetimibe (ZETIA) 10 MG tablet, Take 1 tablet (10 mg total) by mouth daily., Disp: 90 tablet, Rfl: 3   furosemide (LASIX) 20 MG tablet, Take 20 mg by mouth daily as needed for fluid., Disp: , Rfl:    levothyroxine (SYNTHROID) 137 MCG tablet, Take 137 mcg by mouth daily before breakfast., Disp: , Rfl:    lisinopril-hydrochlorothiazide (PRINZIDE,ZESTORETIC) 20-12.5 MG per tablet, Take 1 tablet by mouth daily. , Disp: , Rfl:    LORazepam (ATIVAN) 0.5 MG tablet, Take 0.125 mg by mouth every 8 (eight) hours as needed for anxiety., Disp: , Rfl:    Multiple Minerals-Vitamins (CALCIUM-MAGNESIUM-ZINC-D3 PO), Take 2 tablets by mouth 2 (two) times daily., Disp: , Rfl:    nitroGLYCERIN (NITROSTAT) 0.4 MG SL tablet, Place 1 tablet (0.4 mg total) under the tongue every 5 (five) minutes as needed for chest pain., Disp: 25 tablet, Rfl: 2   trolamine salicylate (BLUE-EMU HEMP) 10 % cream, Apply 1 Application topically at bedtime., Disp: , Rfl:    Transthoracic echocardiogram  (TTE), including DSE as applicable: 08/09/2023  New York Heart Association  (NYHA) ll   [place] Cardiovascular Society Score (CCS) angina grade: 1  12- lead electrocardiogram:      Labs: See labs   KCCQ: See worksheet     [id number]: See worksheet     SF-36:  See worksheet

## 2023-08-09 NOTE — Patient Instructions (Addendum)
 Medication Instructions:  Your physician has recommended you make the following change in your medication:  1) On June 27th STOP taking Aspirin *If you need a refill on your cardiac medications before your next appointment, please call your pharmacy*   Testing/Procedures: Chest CT - August 2025 Non-Cardiac CT scanning, (CAT scanning), is a noninvasive, special x-ray that produces cross-sectional images of the body using x-rays and a computer. CT scans help physicians diagnose and treat medical conditions. For some CT exams, a contrast material is used to enhance visibility in the area of the body being studied. CT scans provide greater clarity and reveal more details than regular x-ray exams.  Follow-Up: At [facility], you and your health needs are our priority.  As part of our continuing mission to provide you with exceptional heart care, we have created designated Provider Care Teams.  These Care Teams include your primary Cardiologist (physician) and Advanced Practice Providers (APPs -  Physician Assistants and Nurse Practitioners) who all work together to provide you with the care you need, when you need it.  Your next appointment:   As scheduled

## 2023-08-10 ENCOUNTER — Encounter: Payer: Self-pay | Admitting: Internal Medicine

## 2023-08-10 LAB — BASIC METABOLIC PANEL
BUN/Creatinine Ratio: 16 (ref 12–28)
BUN: 20 mg/dL (ref 8–27)
CO2: 24 mmol/L (ref 20–29)
Calcium: 10 mg/dL (ref 8.7–10.3)
Chloride: 100 mmol/L (ref 96–106)
Creatinine, Ser: 1.27 mg/dL — ABNORMAL HIGH (ref 0.57–1.00)
Glucose: 89 mg/dL (ref 70–99)
Potassium: 4.6 mmol/L (ref 3.5–5.2)
Sodium: 141 mmol/L (ref 134–144)
eGFR: 44 mL/min/{1.73_m2} — ABNORMAL LOW (ref >59)

## 2023-08-10 LAB — PRO B NATRIURETIC PEPTIDE: NT-Pro BNP: 434 pg/mL (ref 0–738)

## 2023-08-10 LAB — CBC
Hematocrit: 41.9 % (ref 34.0–46.6)
Hemoglobin: 14.2 g/dL (ref 11.1–15.9)
MCH: 31.6 pg (ref 26.6–33.0)
MCHC: 33.9 g/dL (ref 31.5–35.7)
MCV: 93 fL (ref 79–97)
Platelets: 194 10*3/uL (ref 150–450)
RBC: 4.49 x10E6/uL (ref 3.77–5.28)
RDW: 12.2 % (ref 11.7–15.4)
WBC: 5.6 10*3/uL (ref 3.4–10.8)

## 2023-08-13 ENCOUNTER — Encounter (HOSPITAL_COMMUNITY)
Admission: RE | Admit: 2023-08-13 | Discharge: 2023-08-13 | Disposition: A | Discharge status: Home / Self Care | Source: Ambulatory Visit | Attending: Internal Medicine | Admitting: Internal Medicine

## 2023-08-13 DIAGNOSIS — Z952 Presence of prosthetic heart valve: Secondary | ICD-10-CM | POA: Diagnosis not present

## 2023-08-13 NOTE — Progress Notes (Signed)
 Daily Session Note  Patient Details  Name: Samantha Wood MRN: [id number] Date of Birth: 04-27-1946 Referring Provider:   Flowsheet Row CARDIAC REHAB PHASE II ORIENTATION from 07/23/2023 in [facility] CARDIAC REHABILITATION  Referring Provider Alverda Skeans MD       Encounter Date: 08/13/2023  Check In:  Session Check In - 08/13/23 1352       Check-In   Supervising physician immediately available to respond to emergencies See telemetry face sheet for immediately available MD    Location [facility]    Staff Present Fabio Pierce, MA, RCEP, CCRP, CCET;Heather Fredric Mare, BS, Exercise Physiologist;Brittany Roseanne Reno, BSN, RN, WTA-C;Phyllis Billingsley, RN    Virtual Visit No    Medication changes reported     No    Fall or balance concerns reported    No    Warm-up and Cool-down Performed on first and last piece of equipment    Resistance Training Performed Yes    VAD Patient? No    PAD/SET Patient? No      Pain Assessment   Currently in Pain? No/denies             Capillary Blood Glucose: No results found for this or any previous visit (from the past 24 hours).    Social History   Tobacco Use  Smoking Status Never  Smokeless Tobacco Never    Goals Met:  Independence with exercise equipment Exercise tolerated well No report of concerns or symptoms today Strength training completed today  Goals Unmet:  Not Applicable  Comments: Pt able to follow exercise prescription today without complaint.  Will continue to monitor for progression.

## 2023-08-14 ENCOUNTER — Encounter (HOSPITAL_COMMUNITY): Payer: Self-pay | Admitting: *Deleted

## 2023-08-14 DIAGNOSIS — Z952 Presence of prosthetic heart valve: Secondary | ICD-10-CM

## 2023-08-14 NOTE — Progress Notes (Signed)
 Cardiac Individual Treatment Plan  Patient Details  Name: Samantha Wood MRN: [id number] Date of Birth: Jan 23, 1946 Referring Provider:   Flowsheet Row CARDIAC REHAB PHASE II ORIENTATION from 07/23/2023 in [facility] CARDIAC REHABILITATION  Referring Provider Alverda Skeans MD       Initial Encounter Date:  Flowsheet Row CARDIAC REHAB PHASE II ORIENTATION from 07/23/2023 in [place] CARDIAC REHABILITATION  Date 07/23/23       Visit Diagnosis: S/P TAVR (transcatheter aortic valve replacement)  Patient's Home Medications on Admission:  Current Outpatient Medications:    acetaminophen (TYLENOL) 650 MG CR tablet, Take 1,300 mg by mouth 2 (two) times daily., Disp: , Rfl:    aspirin EC 81 MG tablet, Take 1 tablet (81 mg total) by mouth daily. Swallow whole., Disp: 90 tablet, Rfl: 1   azithromycin (ZITHROMAX) 500 MG tablet, Take 1 tablet (500 mg total) by mouth as directed. Take one tablet 1 hour before any dental work including cleanings., Disp: 6 tablet, Rfl: 12   calcitRIOL (ROCALTROL) 0.25 MCG capsule, Take 0.25 mcg by mouth 2 (two) times daily. , Disp: , Rfl:    clopidogrel (PLAVIX) 75 MG tablet, Take 1 tablet (75 mg total) by mouth daily., Disp: 90 tablet, Rfl: 6   Cranberry-Vitamin C-Probiotic (AZO CRANBERRY PO), Take 1 tablet by mouth 2 (two) times daily., Disp: , Rfl:    diphenhydrAMINE (BENADRYL) 25 MG tablet, Take 50 mg by mouth at bedtime., Disp: , Rfl:    estradiol (ESTRACE) 1 MG tablet, Take 1 mg by mouth daily. , Disp: , Rfl:    ezetimibe (ZETIA) 10 MG tablet, Take 1 tablet (10 mg total) by mouth daily., Disp: 90 tablet, Rfl: 3   furosemide (LASIX) 20 MG tablet, Take 20 mg by mouth daily as needed for fluid., Disp: , Rfl:    levothyroxine (SYNTHROID) 137 MCG tablet, Take 137 mcg by mouth daily before breakfast., Disp: , Rfl:    lisinopril-hydrochlorothiazide (PRINZIDE,ZESTORETIC) 20-12.5 MG per tablet, Take 1 tablet by mouth daily. , Disp: , Rfl:    LORazepam (ATIVAN) 0.5 MG  tablet, Take 0.125 mg by mouth every 8 (eight) hours as needed for anxiety., Disp: , Rfl:    Multiple Minerals-Vitamins (CALCIUM-MAGNESIUM-ZINC-D3 PO), Take 2 tablets by mouth 2 (two) times daily., Disp: , Rfl:    nitroGLYCERIN (NITROSTAT) 0.4 MG SL tablet, Place 1 tablet (0.4 mg total) under the tongue every 5 (five) minutes as needed for chest pain., Disp: 25 tablet, Rfl: 2   trolamine salicylate (BLUE-EMU HEMP) 10 % cream, Apply 1 Application topically at bedtime., Disp: , Rfl:   Past Medical History: Past Medical History:  Diagnosis Date   (HFpEF) heart failure with preserved ejection fraction (HCC)    Aortic stenosis, moderate    Arthritis    CAD (coronary artery disease) 05/17/2023   s/p PCI/DES to [name]   CKD stage 3b, GFR 30-44 ml/min (HCC)    Follicular thyroid carcinoma (HCC)    History of chicken pox    Hypertension    Hypocalcemia    Obesity    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 Lynnette Caffey and Dr. Laneta Simmers   Sarcoidosis    Vitiligo     Tobacco Use: Social History   Tobacco Use  Smoking Status Never  Smokeless Tobacco Never    Labs: Review Flowsheet       Latest Ref Rng &  Units 05/17/2023 07/09/2023  Labs for ITP Cardiac and Pulmonary Rehab  PH, Arterial 7.35 - 7.45 7.380  -  PCO2 arterial 32 - 48 mmHg 43.3  -  Bicarbonate 20.0 - 28.0 mmol/L 25.0  25.0  25.6  -  TCO2 22 - 32 mmol/L 26  26  27  27    Acid-base deficit 0.0 - 2.0 mmol/L 1.0  1.0  -  O2 Saturation % 69  75  89  -    Details       Multiple values from one day are sorted in reverse-chronological order         Capillary Blood Glucose: No results found for: "GLUCAP"   Exercise Target Goals: Exercise Program Goal: Individual exercise prescription set using results from initial 6 min walk test and THRR while considering  patient's activity barriers and safety.   Exercise  Prescription Goal: Starting with aerobic activity 30 plus minutes a day, 3 days per week for initial exercise prescription. Provide home exercise prescription and guidelines that participant acknowledges understanding prior to discharge.  Activity Barriers & Risk Stratification:  Activity Barriers & Cardiac Risk Stratification - 07/23/23 0843       Activity Barriers & Cardiac Risk Stratification   Activity Barriers Arthritis;Joint Problems;Left Knee Replacement;Deconditioning;Muscular Weakness;Other (comment);Back Problems;Balance Concerns    Comments arthrisits in R hip and wrist, TKR L knee 2006, occassional back pain by hip    Cardiac Risk Stratification Moderate             6 Minute Walk:  6 Minute Walk     Row Name 07/23/23 0951         6 Minute Walk   Phase Initial     Distance 1238 feet     Walk Time 6 minutes     # of Rest Breaks 0     MPH 2.34     METS 2.53     RPE 15     Perceived Dyspnea  1     VO2 Peak 8.85     Symptoms Yes (comment)     Comments slightly winded, fatgiued at end     Resting HR 64 bpm     Resting BP 126/70     Resting Oxygen Saturation  97 %     Exercise Oxygen Saturation  during 6 min walk 96 %     Max Ex. HR 119 bpm     Max Ex. BP 178/64     2 Minute Post BP 128/70              Oxygen Initial Assessment:   Oxygen Re-Evaluation:   Oxygen Discharge (Final Oxygen Re-Evaluation):   Initial Exercise Prescription:  Initial Exercise Prescription - 07/23/23 0900       Date of Initial Exercise RX and Referring Provider   Date 07/23/23    Referring Provider Alverda Skeans MD      Oxygen   Maintain Oxygen Saturation 88% or higher      Treadmill   MPH 1.5    Grade 0.5    Minutes 15    METs 2.25      NuStep   Level 1    SPM 80    Minutes 15    METs 2.5      Prescription Details   Frequency (times per week) 2    Duration Progress to 30 minutes of continuous aerobic without signs/symptoms of physical distress       Intensity   THRR 40-80% of  Max Heartrate [phone]    Ratings of Perceived Exertion 11-13    Perceived Dyspnea 0-4      Progression   Progression Continue to progress workloads to maintain intensity without signs/symptoms of physical distress.      Resistance Training   Training Prescription Yes    Weight 3 lb    Reps 10-15             Perform Capillary Blood Glucose checks as needed.  Exercise Prescription Changes:   Exercise Prescription Changes     Row Name 07/23/23 0900 07/25/23 1200           Response to Exercise   Blood Pressure (Admit) 126/70 138/74      Blood Pressure (Exercise) 178/64 142/70      Blood Pressure (Exit) 128/70 120/68      Heart Rate (Admit) 64 bpm 86 bpm      Heart Rate (Exercise) 119 bpm 108 bpm      Heart Rate (Exit) 68 bpm 76 bpm      Oxygen Saturation (Admit) 97 % --      Oxygen Saturation (Exercise) 96 % --      Rating of Perceived Exertion (Exercise) 15 14      Perceived Dyspnea (Exercise) 1 --      Symptoms slightly SOB, fatigued at end --      Comments walk test results --      Duration -- Continue with 30 min of aerobic exercise without signs/symptoms of physical distress.      Intensity -- THRR unchanged        Progression   Progression -- Continue to progress workloads to maintain intensity without signs/symptoms of physical distress.        Resistance Training   Training Prescription -- Yes      Weight -- 3      Reps -- 10-15        Treadmill   MPH -- 0.7      Grade -- 0      Minutes -- 15      METs -- 1.57        NuStep   Level -- 1      SPM -- 55      Minutes -- 10      METs -- 1.8               Exercise Comments:   Exercise Comments     Row Name 07/25/23 1336           Exercise Comments First full day of exercise!  Patient was oriented to gym and equipment including functions, settings, policies, and procedures.  Patient's individual exercise prescription and treatment plan were reviewed.  All  starting workloads were established based on the results of the 6 minute walk test done at initial orientation visit.  The plan for exercise progression was also introduced and progression will be customized based on patient's performance and goals.                Exercise Goals and Review:   Exercise Goals     Row Name 07/23/23 0953             Exercise Goals   Increase Physical Activity Yes       Intervention Provide advice, education, support and counseling about physical activity/exercise needs.;Develop an individualized exercise prescription for aerobic and resistive training based on initial evaluation findings, risk stratification, comorbidities and participant's personal goals.  Expected Outcomes Short Term: Attend rehab on a regular basis to increase amount of physical activity.;Long Term: Add in home exercise to make exercise part of routine and to increase amount of physical activity.;Long Term: Exercising regularly at least 3-5 days a week.       Increase Strength and Stamina Yes       Intervention Provide advice, education, support and counseling about physical activity/exercise needs.;Develop an individualized exercise prescription for aerobic and resistive training based on initial evaluation findings, risk stratification, comorbidities and participant's personal goals.       Expected Outcomes Short Term: Increase workloads from initial exercise prescription for resistance, speed, and METs.;Short Term: Perform resistance training exercises routinely during rehab and add in resistance training at home;Long Term: Improve cardiorespiratory fitness, muscular endurance and strength as measured by increased METs and functional capacity ( )       Able to understand and use rate of perceived exertion (RPE) scale Yes       Intervention Provide education and explanation on how to use RPE scale       Expected Outcomes Short Term: Able to use RPE daily in rehab to express  subjective intensity level;Long Term:  Able to use RPE to guide intensity level when exercising independently       Able to understand and use Dyspnea scale Yes       Intervention Provide education and explanation on how to use Dyspnea scale       Expected Outcomes Short Term: Able to use Dyspnea scale daily in rehab to express subjective sense of shortness of breath during exertion;Long Term: Able to use Dyspnea scale to guide intensity level when exercising independently       Knowledge and understanding of Target Heart Rate Range (THRR) Yes       Intervention Provide education and explanation of THRR including how the numbers were predicted and where they are located for reference       Expected Outcomes Short Term: Able to state/look up THRR;Long Term: Able to use THRR to govern intensity when exercising independently;Short Term: Able to use daily as guideline for intensity in rehab       Able to check pulse independently Yes       Intervention Provide education and demonstration on how to check pulse in carotid and radial arteries.;Review the importance of being able to check your own pulse for safety during independent exercise       Expected Outcomes Short Term: Able to explain why pulse checking is important during independent exercise;Long Term: Able to check pulse independently and accurately       Understanding of Exercise Prescription Yes       Intervention Provide education, explanation, and written materials on patient's individual exercise prescription       Expected Outcomes Short Term: Able to explain program exercise prescription;Long Term: Able to explain home exercise prescription to exercise independently                Exercise Goals Re-Evaluation :  Exercise Goals Re-Evaluation     Row Name 07/25/23 1336 07/29/23 1231           Exercise Goal Re-Evaluation   Exercise Goals Review Able to understand and use rate of perceived exertion (RPE) scale;Knowledge and  understanding of Target Heart Rate Range (THRR);Able to understand and use Dyspnea scale;Understanding of Exercise Prescription Increase Physical Activity;Increase Strength and Stamina;Understanding of Exercise Prescription      Comments Reviewed RPE and dyspnea scale, THR  and program prescription with pt today.  Pt voiced understanding and was given a copy of goals to take home. Samantha Wood just started the rehab program and had a good fist sesson. She is getting use to using the treadmill and nustep. She is exercising at an RPE of 14. Will continue to montior and progress as able,      Expected Outcomes Short: Use RPE daily to regulate intensity.  Long: Follow program prescription in THR. Continue to attend rehab                Discharge Exercise Prescription (Final Exercise Prescription Changes):  Exercise Prescription Changes - 07/25/23 1200       Response to Exercise   Blood Pressure (Admit) 138/74    Blood Pressure (Exercise) 142/70    Blood Pressure (Exit) 120/68    Heart Rate (Admit) 86 bpm    Heart Rate (Exercise) 108 bpm    Heart Rate (Exit) 76 bpm    Rating of Perceived Exertion (Exercise) 14    Duration Continue with 30 min of aerobic exercise without signs/symptoms of physical distress.    Intensity THRR unchanged      Progression   Progression Continue to progress workloads to maintain intensity without signs/symptoms of physical distress.      Resistance Training   Training Prescription Yes    Weight 3    Reps 10-15      Treadmill   MPH 0.7    Grade 0    Minutes 15    METs 1.57      NuStep   Level 1    SPM 55    Minutes 10    METs 1.8             Nutrition:  Target Goals: Understanding of nutrition guidelines, daily intake of sodium 1500mg , cholesterol 200mg , calories 30% from fat and 7% or less from saturated fats, daily to have 5 or more servings of fruits and vegetables.  Biometrics:  Pre Biometrics - 07/23/23 0953       Pre Biometrics   Height  5\' 3"  (1.6 m)    Weight 200 lb 3.2 oz (90.8 kg)    Waist Circumference 35 inches    Hip Circumference 46.5 inches    Waist to Hip Ratio 0.75 %    BMI (Calculated) 35.47    Grip Strength 17.1 kg    Single Leg Stand 4.8 seconds              Nutrition Therapy Plan and Nutrition Goals:  Nutrition Therapy & Goals - 07/23/23 0959       Intervention Plan   Intervention Prescribe, educate and counsel regarding individualized specific dietary modifications aiming towards targeted core components such as weight, hypertension, lipid management, diabetes, heart failure and other comorbidities.;Nutrition handout(s) given to patient.    Expected Outcomes Short Term Goal: Understand basic principles of dietary content, such as calories, fat, sodium, cholesterol and nutrients.;Long Term Goal: Adherence to prescribed nutrition plan.             Nutrition Assessments:  MEDIFICTS Score Key: >=70 Need to make dietary changes  40-70 Heart Healthy Diet <= 40 Therapeutic Level Cholesterol Diet  Flowsheet Row CARDIAC REHAB PHASE II ORIENTATION from 07/23/2023 in [facility] CARDIAC REHABILITATION  Picture Your Plate Total Score on Admission 59      Picture Your Plate Scores: <16 Unhealthy dietary pattern with much room for improvement. 41-50 Dietary pattern unlikely to meet recommendations for good health  and room for improvement. 51-60 More healthful dietary pattern, with some room for improvement.  >60 Healthy dietary pattern, although there may be some specific behaviors that could be improved.    Nutrition Goals Re-Evaluation:   Nutrition Goals Discharge (Final Nutrition Goals Re-Evaluation):   Psychosocial: Target Goals: Acknowledge presence or absence of significant depression and/or stress, maximize coping skills, provide positive support system. Participant is able to verbalize types and ability to use techniques and skills needed for reducing stress and depression.  Initial  Review & Psychosocial Screening:  Initial Psych Review & Screening - 07/23/23 0846       Initial Review   Current issues with Current Psychotropic Meds;Current Stress Concerns    Source of Stress Concerns Chronic Illness;Unable to participate in former interests or hobbies;Unable to perform yard/household activities    Comments has lorapam for anixety uses in small doses as needed      Family Dynamics   Good Support System? Yes   Willodean Rosenthal ([place]), daughter in law Clydie Braun (leans on her too)   Concerns Recent loss of child    Comments Only son passed last February      Barriers   Psychosocial barriers to participate in program The patient should benefit from training in stress management and relaxation.;Psychosocial barriers identified (see note)      Screening Interventions   Interventions Encouraged to exercise;Provide feedback about the scores to participant;To provide support and resources with identified psychosocial needs    Expected Outcomes Short Term goal: Utilizing psychosocial counselor, staff and physician to assist with identification of specific Stressors or current issues interfering with healing process. Setting desired goal for each stressor or current issue identified.;Long Term Goal: Stressors or current issues are controlled or eliminated.;Short Term goal: Identification and review with participant of any Quality of Life or Depression concerns found by scoring the questionnaire.;Long Term goal: The participant improves quality of Life and PHQ9 Scores as seen by post scores and/or verbalization of changes             Quality of Life Scores:  Quality of Life - 07/23/23 0958       Quality of Life   Select Quality of Life      Quality of Life Scores   Health/Function Pre 29.6 %    Socioeconomic Pre 30 %    Psych/Spiritual Pre 30 %    Family Pre 30 %    GLOBAL Pre 29.83 %            Scores of 19 and below usually indicate a poorer quality of life in these  areas.  A difference of  2-3 points is a clinically meaningful difference.  A difference of 2-3 points in the total score of the Quality of Life Index has been associated with significant improvement in overall quality of life, self-image, physical symptoms, and general health in studies assessing change in quality of life.  PHQ-9: Review Flowsheet       07/23/2023  Depression screen PHQ 2/9  Decreased Interest 0  Down, Depressed, Hopeless 0  PHQ - 2 Score 0  Altered sleeping 0  Tired, decreased energy 0  Change in appetite 0  Feeling bad or failure about yourself  0  Trouble concentrating 0  Moving slowly or fidgety/restless 0  Suicidal thoughts 0  PHQ-9 Score 0  Difficult doing work/chores Not difficult at all   Interpretation of Total Score  Total Score Depression Severity:  1-4 = Minimal depression, 5-9 = Mild depression,  10-14 = Moderate depression, 15-19 = Moderately severe depression, 20-27 = Severe depression   Psychosocial Evaluation and Intervention:  Psychosocial Evaluation - 07/23/23 0954       Psychosocial Evaluation & Interventions   Interventions Stress management education;Relaxation education;Encouraged to exercise with the program and follow exercise prescription    Comments Samantha Wood is coming into cardiac rehab after a TAVR.  She also had a stent placed back in December and was unable to start rehab due to TAVR workup. She is eager to get going and has begun to regain her strength already. She wants to get all her strength back and to feel better overall. She lives with her friend Homero Fellers and relies heavily on him.  She does have a history of anxiety but tires to only take a 1/4 tab of lorazapam at a time.  She usually sleeps well but on occassion will wake at night and stress over the loss of her son again and then she will take a full dose to get back to sleep.  She lost her son a year ago in February.  It was unexpected and still hard on both her and her daughter in law.   They lean on each other for support.  She and Homero Fellers go dancing every week and weekend at the [facility] for exercise and to get out and about.  She is eager to get dancing without stopping again.  She also enjoys wood turning and painting in her down time.  She has no barriers to attending rehab and plans to come twice a week.    Expected Outcomes Short; Attend rehab regularly to build up stamina Long: Able to continue to go dancing every week    Continue Psychosocial Services  Follow up required by staff             Psychosocial Re-Evaluation:   Psychosocial Discharge (Final Psychosocial Re-Evaluation):   Vocational Rehabilitation: Provide vocational rehab assistance to qualifying candidates.   Vocational Rehab Evaluation & Intervention:  Vocational Rehab - 07/23/23 0848       Initial Vocational Rehab Evaluation & Intervention   Assessment shows need for Vocational Rehabilitation No   retired            Education: Education Goals: Education classes will be provided on a weekly basis, covering required topics. Participant will state understanding/return demonstration of topics presented.  Learning Barriers/Preferences:  Learning Barriers/Preferences - 07/23/23 0844       Learning Barriers/Preferences   Learning Barriers Sight   glasses for reading   Learning Preferences None             Education Topics: Hypertension, Hypertension Reduction -Define heart disease and high blood pressure. Discus how high blood pressure affects the body and ways to reduce high blood pressure.   Exercise and Your Heart -Discuss why it is important to exercise, the FITT principles of exercise, normal and abnormal responses to exercise, and how to exercise safely.   Angina -Discuss definition of angina, causes of angina, treatment of angina, and how to decrease risk of having angina.   Cardiac Medications -Review what the following cardiac medications are used for, how they affect  the body, and side effects that may occur when taking the medications.  Medications include Aspirin, Beta blockers, calcium channel blockers, ACE Inhibitors, angiotensin receptor blockers, diuretics, digoxin, and antihyperlipidemics.   Congestive Heart Failure -Discuss the definition of CHF, how to live with CHF, the signs and symptoms of CHF, and how keep track of  weight and sodium intake. Flowsheet Row CARDIAC REHAB PHASE II EXERCISE from 08/08/2023 in [place] CARDIAC REHABILITATION  Date 08/08/23  Educator [facility]  Instruction Review Code 1- Verbalizes Understanding       Heart Disease and Intimacy -Discus the effect sexual activity has on the heart, how changes occur during intimacy as we age, and safety during sexual activity.   Smoking Cessation / COPD -Discuss different methods to quit smoking, the health benefits of quitting smoking, and the definition of COPD.   Nutrition I: Fats -Discuss the types of cholesterol, what cholesterol does to the heart, and how cholesterol levels can be controlled. Flowsheet Row CARDIAC REHAB PHASE II EXERCISE from 08/08/2023 in [place] CARDIAC REHABILITATION  Date 07/25/23  Educator [facility]  Instruction Review Code 1- Verbalizes Understanding       Nutrition II: Labels -Discuss the different components of food labels and how to read food label   Heart Parts/Heart Disease and PAD -Discuss the anatomy of the heart, the pathway of blood circulation through the heart, and these are affected by heart disease.   Stress I: Signs and Symptoms -Discuss the causes of stress, how stress may lead to anxiety and depression, and ways to limit stress. Flowsheet Row CARDIAC REHAB PHASE II EXERCISE from 08/08/2023 in [place] CARDIAC REHABILITATION  Date 08/01/23  Educator DJ  Instruction Review Code 1- Verbalizes Understanding       Stress II: Relaxation -Discuss different types of relaxation techniques to limit stress.   Warning Signs of Stroke  / TIA -Discuss definition of a stroke, what the signs and symptoms are of a stroke, and how to identify when someone is having stroke.   Knowledge Questionnaire Score:  Knowledge Questionnaire Score - 07/23/23 0959       Knowledge Questionnaire Score   Pre Score 22/26             Core Components/Risk Factors/Patient Goals at Admission:  Personal Goals and Risk Factors at Admission - 07/23/23 0959       Core Components/Risk Factors/Patient Goals on Admission    Weight Management Yes;Obesity;Weight Loss    Intervention Weight Management: Develop a combined nutrition and exercise program designed to reach desired caloric intake, while maintaining appropriate intake of nutrient and fiber, sodium and fats, and appropriate energy expenditure required for the weight goal.;Weight Management: Provide education and appropriate resources to help participant work on and attain dietary goals.;Weight Management/Obesity: Establish reasonable short term and long term weight goals.;Obesity: Provide education and appropriate resources to help participant work on and attain dietary goals.    Admit Weight 200 lb 3.2 oz (90.8 kg)    Goal Weight: Short Term 195 lb (88.5 kg)    Goal Weight: Long Term 190 lb (86.2 kg)    Expected Outcomes Short Term: Continue to assess and modify interventions until short term weight is achieved;Long Term: Adherence to nutrition and physical activity/exercise program aimed toward attainment of established weight goal;Weight Loss: Understanding of general recommendations for a balanced deficit meal plan, which promotes 1-2 lb weight loss per week and includes a negative energy balance of [phone] kcal/d;Understanding recommendations for meals to include 15-35% energy as protein, 25-35% energy from fat, 35-60% energy from carbohydrates, less than 200mg  of dietary cholesterol, 20-35 gm of total fiber daily;Understanding of distribution of calorie intake throughout the day with the  consumption of 4-5 meals/snacks    Hypertension Yes    Intervention Provide education on lifestyle modifcations including regular physical activity/exercise, weight management, moderate  sodium restriction and increased consumption of fresh fruit, vegetables, and low fat dairy, alcohol moderation, and smoking cessation.;Monitor prescription use compliance.    Expected Outcomes Short Term: Continued assessment and intervention until BP is < 140/37mm HG in hypertensive participants. < 130/40mm HG in hypertensive participants with diabetes, heart failure or chronic kidney disease.;Long Term: Maintenance of blood pressure at goal levels.    Lipids Yes    Intervention Provide education and support for participant on nutrition & aerobic/resistive exercise along with prescribed medications to achieve LDL 70mg , HDL >40mg .    Expected Outcomes Short Term: Participant states understanding of desired cholesterol values and is compliant with medications prescribed. Participant is following exercise prescription and nutrition guidelines.;Long Term: Cholesterol controlled with medications as prescribed, with individualized exercise RX and with personalized nutrition plan. Value goals: LDL < 70mg , HDL > 40 mg.             Core Components/Risk Factors/Patient Goals Review:    Core Components/Risk Factors/Patient Goals at Discharge (Final Review):    ITP Comments:  ITP Comments     Row Name 07/23/23 0951 07/25/23 1336 08/14/23 1159       ITP Comments Patient attend orientation today.  Patient is attending Cardiac Rehabilitation Program.  Documentation for diagnosis can be found in [facility] 07/10/23.  Reviewed medical chart, RPE/RPD, gym safety and program guidelines.  Patient was fitted to equipment they will be using during rehab.  Patient is scheduled to start exercise on Thursday 07/25/23 at 1330.   Initial ITP created and sent for review and signature by Dr. Dina Rich, Medical Director for Cardiac  Rehabilitation Program. First full day of exercise!  Patient was oriented to gym and equipment including functions, settings, policies, and procedures.  Patient's individual exercise prescription and treatment plan were reviewed.  All starting workloads were established based on the results of the 6 minute walk test done at initial orientation visit.  The plan for exercise progression was also introduced and progression will be customized based on patient's performance and goals. 30 day review completed. ITP sent to Dr. Dina Rich, Medical Director of Cardiac Rehab. Continue with ITP unless changes are made by physician.  Newer to program              Comments: 30 day review

## 2023-08-15 ENCOUNTER — Encounter (HOSPITAL_COMMUNITY)
Admission: RE | Admit: 2023-08-15 | Discharge: 2023-08-15 | Disposition: A | Discharge status: Home / Self Care | Source: Ambulatory Visit | Attending: Internal Medicine

## 2023-08-15 DIAGNOSIS — Z952 Presence of prosthetic heart valve: Secondary | ICD-10-CM

## 2023-08-15 NOTE — Progress Notes (Signed)
 Daily Session Note  Patient Details  Name: GENESIS NOVOSAD MRN: [id number] Date of Birth: November 29, 1945 Referring Provider:   Flowsheet Row CARDIAC REHAB PHASE II ORIENTATION from 07/23/2023 in [facility] CARDIAC REHABILITATION  Referring Provider Alverda Skeans MD       Encounter Date: 08/15/2023  Check In:  Session Check In - 08/15/23 1330       Check-In   Supervising physician immediately available to respond to emergencies See telemetry face sheet for immediately available MD    Location [facility]    Staff Present Ross Ludwig, BS, Exercise Physiologist;Hillary Leonidas Romberg BSN, RN    Virtual Visit No    Medication changes reported     No    Fall or balance concerns reported    No    Tobacco Cessation No Change    Warm-up and Cool-down Performed on first and last piece of equipment    Resistance Training Performed Yes    VAD Patient? No    PAD/SET Patient? No      Pain Assessment   Currently in Pain? No/denies    Pain Score 0-No pain    Multiple Pain Sites No             Capillary Blood Glucose: No results found for this or any previous visit (from the past 24 hours).    Social History   Tobacco Use  Smoking Status Never  Smokeless Tobacco Never    Goals Met:  Independence with exercise equipment Exercise tolerated well No report of concerns or symptoms today Strength training completed today  Goals Unmet:  Not Applicable  Comments: Pt able to follow exercise prescription today without complaint.  Will continue to monitor for progression.

## 2023-08-20 ENCOUNTER — Encounter (HOSPITAL_COMMUNITY)
Admission: RE | Admit: 2023-08-20 | Discharge: 2023-08-20 | Disposition: A | Discharge status: Home / Self Care | Source: Ambulatory Visit | Attending: Internal Medicine | Admitting: Internal Medicine

## 2023-08-20 DIAGNOSIS — Z952 Presence of prosthetic heart valve: Secondary | ICD-10-CM | POA: Insufficient documentation

## 2023-08-20 NOTE — Progress Notes (Signed)
 Daily Session Note  Patient Details  Name: TAIMI TOWE MRN: [id number] Date of Birth: 1945-10-27 Referring Provider:   Flowsheet Row CARDIAC REHAB PHASE II ORIENTATION from 07/23/2023 in [facility] CARDIAC REHABILITATION  Referring Provider Alverda Skeans MD       Encounter Date: 08/20/2023  Check In:  Session Check In - 08/20/23 1329       Check-In   Supervising physician immediately available to respond to emergencies See telemetry face sheet for immediately available MD    Location [facility]    Staff Present Desiree Lucy, BSN, RN, WTA-C;Brooke Booth, RN;Heather Hilda, BS, Exercise Physiologist;Jessica [place], MA, RCEP, CCRP, CCET    Virtual Visit No    Medication changes reported     No    Fall or balance concerns reported    No    Tobacco Cessation No Change    Current number of cigarettes/nicotine per day     0    Warm-up and Cool-down Performed on first and last piece of equipment    Resistance Training Performed Yes    VAD Patient? No    PAD/SET Patient? No      Pain Assessment   Currently in Pain? No/denies             Capillary Blood Glucose: No results found for this or any previous visit (from the past 24 hours).    Social History   Tobacco Use  Smoking Status Never  Smokeless Tobacco Never    Goals Met:  Independence with exercise equipment No report of concerns or symptoms today Strength training completed today  Goals Unmet:  Not Applicable  Comments: Pt able to follow exercise prescription today without complaint.  Will continue to monitor for progression.

## 2023-08-22 ENCOUNTER — Encounter (HOSPITAL_COMMUNITY)
Admission: RE | Admit: 2023-08-22 | Discharge: 2023-08-22 | Disposition: A | Discharge status: Home / Self Care | Source: Ambulatory Visit | Attending: Internal Medicine

## 2023-08-22 DIAGNOSIS — Z952 Presence of prosthetic heart valve: Secondary | ICD-10-CM

## 2023-08-22 DIAGNOSIS — L989 Disorder of the skin and subcutaneous tissue, unspecified: Secondary | ICD-10-CM | POA: Diagnosis not present

## 2023-08-22 DIAGNOSIS — C44529 Squamous cell carcinoma of skin of other part of trunk: Secondary | ICD-10-CM | POA: Diagnosis not present

## 2023-08-22 NOTE — Progress Notes (Signed)
 Daily Session Note  Patient Details  Name: Samantha Wood MRN: [id number] Date of Birth: Apr 03, 1946 Referring Provider:   Flowsheet Row CARDIAC REHAB PHASE II ORIENTATION from 07/23/2023 in [facility] CARDIAC REHABILITATION  Referring Provider Alverda Skeans MD       Encounter Date: 08/22/2023  Check In:  Session Check In - 08/22/23 1315       Check-In   Supervising physician immediately available to respond to emergencies See telemetry face sheet for immediately available MD    Location [facility]    Staff Present Avanell Shackleton BSN, RN;Heather Fredric Mare, [place], Exercise Physiologist;Tina Jake Shark, RN    Virtual Visit No    Medication changes reported     No    Fall or balance concerns reported    No    Tobacco Cessation No Change    Warm-up and Cool-down Performed on first and last piece of equipment    Resistance Training Performed Yes    VAD Patient? No    PAD/SET Patient? No      Pain Assessment   Currently in Pain? No/denies    Pain Score 0-No pain    Multiple Pain Sites No             Capillary Blood Glucose: No results found for this or any previous visit (from the past 24 hours).    Social History   Tobacco Use  Smoking Status Never  Smokeless Tobacco Never    Goals Met:  Independence with exercise equipment Exercise tolerated well No report of concerns or symptoms today Strength training completed today  Goals Unmet:  Not Applicable  Comments: Marland KitchenMarland KitchenPt able to follow exercise prescription today without complaint.  Will continue to monitor for progression.

## 2023-08-23 DIAGNOSIS — C4442 Squamous cell carcinoma of skin of scalp and neck: Secondary | ICD-10-CM | POA: Diagnosis not present

## 2023-08-27 ENCOUNTER — Encounter (HOSPITAL_COMMUNITY)
Admission: RE | Admit: 2023-08-27 | Discharge: 2023-08-27 | Disposition: A | Discharge status: Home / Self Care | Source: Ambulatory Visit | Attending: Internal Medicine

## 2023-08-27 DIAGNOSIS — Z952 Presence of prosthetic heart valve: Secondary | ICD-10-CM

## 2023-08-27 NOTE — Progress Notes (Signed)
 Daily Session Note  Patient Details  Name: Samantha Wood MRN: [id number] Date of Birth: Sep 22, 1945 Referring Provider:   Flowsheet Row CARDIAC REHAB PHASE II ORIENTATION from 07/23/2023 in [facility] CARDIAC REHABILITATION  Referring Provider Alverda Skeans MD       Encounter Date: 08/27/2023  Check In:  Session Check In - 08/27/23 1342       Check-In   Supervising physician immediately available to respond to emergencies See telemetry face sheet for immediately available MD    Location [facility]    Staff Present Fabio Pierce, MA, RCEP, CCRP, CCET;Jaydan Meidinger Roseanne Reno, BSN, RN, WTA-C    Virtual Visit No    Medication changes reported     No    Fall or balance concerns reported    No    Tobacco Cessation No Change    Warm-up and Cool-down Performed on first and last piece of equipment    Resistance Training Performed Yes    VAD Patient? No    PAD/SET Patient? No      Pain Assessment   Currently in Pain? No/denies    Pain Score 0-No pain             Capillary Blood Glucose: No results found for this or any previous visit (from the past 24 hours).    Social History   Tobacco Use  Smoking Status Never  Smokeless Tobacco Never    Goals Met:  Independence with exercise equipment Exercise tolerated well No report of concerns or symptoms today Strength training completed today  Goals Unmet:  Not Applicable  Comments: Pt able to follow exercise prescription today without complaint.  Will continue to monitor for progression.

## 2023-08-29 ENCOUNTER — Encounter (HOSPITAL_COMMUNITY)
Admission: RE | Admit: 2023-08-29 | Discharge: 2023-08-29 | Disposition: A | Discharge status: Home / Self Care | Source: Ambulatory Visit | Attending: Internal Medicine

## 2023-08-29 DIAGNOSIS — Z952 Presence of prosthetic heart valve: Secondary | ICD-10-CM | POA: Diagnosis not present

## 2023-08-29 NOTE — Progress Notes (Signed)
 Daily Session Note  Patient Details  Name: JUANITA STREIGHT MRN: [id number] Date of Birth: 03-06-1946 Referring Provider:   Flowsheet Row CARDIAC REHAB PHASE II ORIENTATION from 07/23/2023 in [facility] CARDIAC REHABILITATION  Referring Provider Alverda Skeans MD       Encounter Date: 08/29/2023  Check In:  Session Check In - 08/29/23 1315       Check-In   Supervising physician immediately available to respond to emergencies See telemetry face sheet for immediately available MD    Location [facility]    Staff Present Avanell Shackleton BSN, RN;Heather Fredric Mare, BS, Exercise Physiologist;Jessica [place], MA, RCEP, CCRP, Dow Adolph, RN, BSN    Virtual Visit No    Medication changes reported     No    Fall or balance concerns reported    No    Tobacco Cessation No Change    Warm-up and Cool-down Performed on first and last piece of equipment    Resistance Training Performed Yes    VAD Patient? No    PAD/SET Patient? No      Pain Assessment   Currently in Pain? No/denies    Pain Score 0-No pain    Multiple Pain Sites No             Capillary Blood Glucose: No results found for this or any previous visit (from the past 24 hours).    Social History   Tobacco Use  Smoking Status Never  Smokeless Tobacco Never    Goals Met:  Independence with exercise equipment Exercise tolerated well No report of concerns or symptoms today Strength training completed today  Goals Unmet:  Not Applicable  Comments: Marland KitchenMarland KitchenPt able to follow exercise prescription today without complaint.  Will continue to monitor for progression.

## 2023-09-03 ENCOUNTER — Encounter (HOSPITAL_COMMUNITY)
Admission: RE | Admit: 2023-09-03 | Discharge: 2023-09-03 | Disposition: A | Discharge status: Home / Self Care | Source: Ambulatory Visit | Attending: Internal Medicine

## 2023-09-03 DIAGNOSIS — Z952 Presence of prosthetic heart valve: Secondary | ICD-10-CM | POA: Diagnosis not present

## 2023-09-03 NOTE — Progress Notes (Signed)
 Daily Session Note  Patient Details  Name: AZELIA REIGER MRN: [id number] Date of Birth: 09/23/1945 Referring Provider:   Flowsheet Row CARDIAC REHAB PHASE II ORIENTATION from 07/23/2023 in [facility] CARDIAC REHABILITATION  Referring Provider Alyssa Backbone MD       Encounter Date: 09/03/2023  Check In:  Session Check In - 09/03/23 1330       Check-In   Supervising physician immediately available to respond to emergencies See telemetry face sheet for immediately available MD    Location [facility]    Staff Present Clotilda Danish, BS, Exercise Physiologist;Amair Shrout, RN;Brittany Annette Barters, BSN, RN, Octavia Belton, BS, RRT, CPFT    Virtual Visit No    Medication changes reported     No    Fall or balance concerns reported    No    Warm-up and Cool-down Performed on first and last piece of equipment    Resistance Training Performed Yes    VAD Patient? No    PAD/SET Patient? No      Pain Assessment   Currently in Pain? No/denies    Pain Score 0-No pain    Multiple Pain Sites No             Capillary Blood Glucose: No results found for this or any previous visit (from the past 24 hours).    Social History   Tobacco Use  Smoking Status Never  Smokeless Tobacco Never    Goals Met:  Independence with exercise equipment Exercise tolerated well No report of concerns or symptoms today Strength training completed today  Goals Unmet:  Not Applicable  Comments: Pt able to follow exercise prescription today without complaint.  Will continue to monitor for progression.

## 2023-09-04 DIAGNOSIS — I5032 Chronic diastolic (congestive) heart failure: Secondary | ICD-10-CM | POA: Diagnosis not present

## 2023-09-04 DIAGNOSIS — I350 Nonrheumatic aortic (valve) stenosis: Secondary | ICD-10-CM | POA: Diagnosis not present

## 2023-09-04 DIAGNOSIS — I2510 Atherosclerotic heart disease of native coronary artery without angina pectoris: Secondary | ICD-10-CM | POA: Diagnosis not present

## 2023-09-04 DIAGNOSIS — N1831 Chronic kidney disease, stage 3a: Secondary | ICD-10-CM | POA: Diagnosis not present

## 2023-09-04 DIAGNOSIS — I10 Essential (primary) hypertension: Secondary | ICD-10-CM | POA: Diagnosis not present

## 2023-09-05 ENCOUNTER — Encounter (HOSPITAL_COMMUNITY)
Admission: RE | Admit: 2023-09-05 | Discharge: 2023-09-05 | Disposition: A | Discharge status: Home / Self Care | Source: Ambulatory Visit | Attending: Internal Medicine | Admitting: Internal Medicine

## 2023-09-05 DIAGNOSIS — Z952 Presence of prosthetic heart valve: Secondary | ICD-10-CM

## 2023-09-05 LAB — COMPREHENSIVE METABOLIC PANEL WITH GFR
ALT: 10 IU/L (ref 0–32)
AST: 14 IU/L (ref 0–40)
Albumin: 4.2 g/dL (ref 3.8–4.8)
Alkaline Phosphatase: 57 IU/L (ref 44–121)
BUN/Creatinine Ratio: 18 (ref 12–28)
BUN: 23 mg/dL (ref 8–27)
Bilirubin Total: 1 mg/dL (ref 0.0–1.2)
CO2: 23 mmol/L (ref 20–29)
Calcium: 9.4 mg/dL (ref 8.7–10.3)
Chloride: 101 mmol/L (ref 96–106)
Creatinine, Ser: 1.28 mg/dL — ABNORMAL HIGH (ref 0.57–1.00)
Globulin, Total: 1.9 g/dL (ref 1.5–4.5)
Glucose: 85 mg/dL (ref 70–99)
Potassium: 4.2 mmol/L (ref 3.5–5.2)
Sodium: 140 mmol/L (ref 134–144)
Total Protein: 6.1 g/dL (ref 6.0–8.5)
eGFR: 43 mL/min/{1.73_m2} — ABNORMAL LOW (ref >59)

## 2023-09-05 LAB — LIPID PANEL
Chol/HDL Ratio: 3.9 ratio (ref 0.0–4.4)
Cholesterol, Total: 216 mg/dL — ABNORMAL HIGH (ref 100–199)
HDL: 55 mg/dL (ref >39)
LDL Chol Calc (NIH): 131 mg/dL — ABNORMAL HIGH (ref 0–99)
Triglycerides: 167 mg/dL — ABNORMAL HIGH (ref 0–149)
VLDL Cholesterol Cal: 30 mg/dL (ref 5–40)

## 2023-09-05 NOTE — Progress Notes (Signed)
 Daily Session Note  Patient Details  Name: ANETTA OLVERA MRN: [id number] Date of Birth: 1945/12/11 Referring Provider:   Flowsheet Row CARDIAC REHAB PHASE II ORIENTATION from 07/23/2023 in [facility] CARDIAC REHABILITATION  Referring Provider Alyssa Backbone MD       Encounter Date: 09/05/2023  Check In:  Session Check In - 09/05/23 1330       Check-In   Supervising physician immediately available to respond to emergencies See telemetry face sheet for immediately available MD    Location [facility]    Staff Present Clotilda Danish, BS, Exercise Physiologist;Hillary Lennie Ra BSN, RN;Brooke Rollin Clock, RN;Other    Virtual Visit No    Medication changes reported     No    Fall or balance concerns reported    No    Tobacco Cessation No Change    Warm-up and Cool-down Performed on first and last piece of equipment    Resistance Training Performed Yes    VAD Patient? No    PAD/SET Patient? No      Pain Assessment   Currently in Pain? No/denies    Pain Score 0-No pain    Multiple Pain Sites No             Capillary Blood Glucose: No results found for this or any previous visit (from the past 24 hours).    Social History   Tobacco Use  Smoking Status Never  Smokeless Tobacco Never    Goals Met:  Independence with exercise equipment Exercise tolerated well No report of concerns or symptoms today Strength training completed today  Goals Unmet:  Not Applicable  Comments: Pt able to follow exercise prescription today without complaint.  Will continue to monitor for progression.

## 2023-09-06 ENCOUNTER — Other Ambulatory Visit: Payer: Self-pay | Admitting: Physician Assistant

## 2023-09-06 DIAGNOSIS — I2510 Atherosclerotic heart disease of native coronary artery without angina pectoris: Secondary | ICD-10-CM

## 2023-09-06 DIAGNOSIS — E785 Hyperlipidemia, unspecified: Secondary | ICD-10-CM

## 2023-09-10 ENCOUNTER — Encounter (HOSPITAL_COMMUNITY)
Admission: RE | Admit: 2023-09-10 | Discharge: 2023-09-10 | Disposition: A | Discharge status: Home / Self Care | Source: Ambulatory Visit | Attending: Internal Medicine

## 2023-09-10 DIAGNOSIS — Z952 Presence of prosthetic heart valve: Secondary | ICD-10-CM

## 2023-09-10 NOTE — Progress Notes (Signed)
 Daily Session Note  Patient Details  Name: Samantha Wood MRN: [id number] Date of Birth: 1945/12/04 Referring Provider:   Flowsheet Row CARDIAC REHAB PHASE II ORIENTATION from 07/23/2023 in [facility] CARDIAC REHABILITATION  Referring Provider Alyssa Backbone MD       Encounter Date: 09/10/2023  Check In:  Session Check In - 09/10/23 1330       Check-In   Supervising physician immediately available to respond to emergencies See telemetry face sheet for immediately available MD    Location [facility]    Staff Present Clotilda Danish, BS, Exercise Physiologist;Brittany Annette Barters, BSN, RN, WTA-C;Chick Cousins, RN;Jessica Babcock, MA, RCEP, CCRP, CCET    Virtual Visit No    Medication changes reported     No    Fall or balance concerns reported    No    Warm-up and Cool-down Performed on first and last piece of equipment    Resistance Training Performed Yes    VAD Patient? No    PAD/SET Patient? No      Pain Assessment   Currently in Pain? No/denies    Pain Score 0-No pain    Multiple Pain Sites No             Capillary Blood Glucose: No results found for this or any previous visit (from the past 24 hours).    Social History   Tobacco Use  Smoking Status Never  Smokeless Tobacco Never    Goals Met:  Independence with exercise equipment Exercise tolerated well No report of concerns or symptoms today Strength training completed today  Goals Unmet:  Not Applicable  Comments: Pt able to follow exercise prescription today without complaint.  Will continue to monitor for progression.

## 2023-09-11 ENCOUNTER — Encounter (HOSPITAL_COMMUNITY): Payer: Self-pay | Admitting: *Deleted

## 2023-09-11 DIAGNOSIS — Z952 Presence of prosthetic heart valve: Secondary | ICD-10-CM

## 2023-09-11 NOTE — Progress Notes (Signed)
 Cardiac Individual Treatment Plan  Patient Details  Name: Samantha Wood MRN: [id number] Date of Birth: 1945-08-10 Referring Provider:   Flowsheet Row CARDIAC REHAB PHASE II ORIENTATION from 07/23/2023 in [facility] CARDIAC REHABILITATION  Referring Provider Alyssa Backbone MD       Initial Encounter Date:  Flowsheet Row CARDIAC REHAB PHASE II ORIENTATION from 07/23/2023 in [place] CARDIAC REHABILITATION  Date 07/23/23       Visit Diagnosis: S/P TAVR (transcatheter aortic valve replacement)  Patient's Home Medications on Admission:  Current Outpatient Medications:    acetaminophen  (TYLENOL ) 650 MG CR tablet, Take 1,300 mg by mouth 2 (two) times daily., Disp: , Rfl:    aspirin  EC 81 MG tablet, Take 1 tablet (81 mg total) by mouth daily. Swallow whole., Disp: 90 tablet, Rfl: 1   azithromycin  (ZITHROMAX ) 500 MG tablet, Take 1 tablet (500 mg total) by mouth as directed. Take one tablet 1 hour before any dental work including cleanings., Disp: 6 tablet, Rfl: 12   calcitRIOL (ROCALTROL) 0.25 MCG capsule, Take 0.25 mcg by mouth 2 (two) times daily. , Disp: , Rfl:    clopidogrel  (PLAVIX ) 75 MG tablet, Take 1 tablet (75 mg total) by mouth daily., Disp: 90 tablet, Rfl: 6   Cranberry-Vitamin C-Probiotic (AZO CRANBERRY PO), Take 1 tablet by mouth 2 (two) times daily., Disp: , Rfl:    diphenhydrAMINE  (BENADRYL ) 25 MG tablet, Take 50 mg by mouth at bedtime., Disp: , Rfl:    estradiol  (ESTRACE ) 1 MG tablet, Take 1 mg by mouth daily. , Disp: , Rfl:    ezetimibe  (ZETIA ) 10 MG tablet, Take 1 tablet (10 mg total) by mouth daily., Disp: 90 tablet, Rfl: 3   furosemide  (LASIX ) 20 MG tablet, Take 20 mg by mouth daily as needed for fluid., Disp: , Rfl:    levothyroxine  (SYNTHROID ) 137 MCG tablet, Take 137 mcg by mouth daily before breakfast., Disp: , Rfl:    lisinopril -hydrochlorothiazide (PRINZIDE,ZESTORETIC) 20-12.5 MG per tablet, Take 1 tablet by mouth daily. , Disp: , Rfl:    LORazepam  (ATIVAN ) 0.5 MG  tablet, Take 0.125 mg by mouth every 8 (eight) hours as needed for anxiety., Disp: , Rfl:    Multiple Minerals-Vitamins (CALCIUM-MAGNESIUM -ZINC-D3 PO), Take 2 tablets by mouth 2 (two) times daily., Disp: , Rfl:    nitroGLYCERIN  (NITROSTAT ) 0.4 MG SL tablet, Place 1 tablet (0.4 mg total) under the tongue every 5 (five) minutes as needed for chest pain., Disp: 25 tablet, Rfl: 2   trolamine salicylate (BLUE-EMU HEMP) 10 % cream, Apply 1 Application topically at bedtime., Disp: , Rfl:   Past Medical History: Past Medical History:  Diagnosis Date   (HFpEF) heart failure with preserved ejection fraction (HCC)    Aortic stenosis, moderate    Arthritis    CAD (coronary artery disease) 05/17/2023   s/p PCI/DES to [facility]   CKD stage 3b, GFR 30-44 ml/min (HCC)    Follicular thyroid  carcinoma (HCC)    History of chicken pox    Hypertension    Hypocalcemia    Obesity    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 Lorie Rook and Dr. Sherene Dilling   Sarcoidosis    Vitiligo     Tobacco Use: Social History   Tobacco Use  Smoking Status Never  Smokeless Tobacco Never    Labs: Review Flowsheet       Latest Ref Rng &  Units 05/17/2023 07/09/2023 09/04/2023  Labs for ITP Cardiac and Pulmonary Rehab  Cholestrol 100 - 199 mg/dL - - 161   LDL (calc) 0 - 99 mg/dL - - 096   HDL-C >04 mg/dL - - 55   Trlycerides 0 - 149 mg/dL - - 540   PH, Arterial 7.35 - 7.45 7.380  - -  PCO2 arterial 32 - 48 mmHg 43.3  - -  Bicarbonate 20.0 - 28.0 mmol/L 25.0  25.0  25.6  - -  TCO2 22 - 32 mmol/L 26  26  27  27   -  Acid-base deficit 0.0 - 2.0 mmol/L 1.0  1.0  - -  O2 Saturation % 69  75  89  - -    Details       Multiple values from one day are sorted in reverse-chronological order         Capillary Blood Glucose: No results found for: "GLUCAP"   Exercise Target Goals: Exercise Program  Goal: Individual exercise prescription set using results from initial 6 min walk test and THRR while considering  patient's activity barriers and safety.   Exercise Prescription Goal: Starting with aerobic activity 30 plus minutes a day, 3 days per week for initial exercise prescription. Provide home exercise prescription and guidelines that participant acknowledges understanding prior to discharge.  Activity Barriers & Risk Stratification:  Activity Barriers & Cardiac Risk Stratification - 07/23/23 0843       Activity Barriers & Cardiac Risk Stratification   Activity Barriers Arthritis;Joint Problems;Left Knee Replacement;Deconditioning;Muscular Weakness;Other (comment);Back Problems;Balance Concerns    Comments arthrisits in R hip and wrist, TKR L knee 2006, occassional back pain by hip    Cardiac Risk Stratification Moderate             6 Minute Walk:  6 Minute Walk     Row Name 07/23/23 0951         6 Minute Walk   Phase Initial     Distance 1238 feet     Walk Time 6 minutes     # of Rest Breaks 0     MPH 2.34     METS 2.53     RPE 15     Perceived Dyspnea  1     VO2 Peak 8.85     Symptoms Yes (comment)     Comments slightly winded, fatgiued at end     Resting HR 64 bpm     Resting BP 126/70     Resting Oxygen  Saturation  97 %     Exercise Oxygen  Saturation  during 6 min walk 96 %     Max Ex. HR 119 bpm     Max Ex. BP 178/64     2 Minute Post BP 128/70              Oxygen  Initial Assessment:   Oxygen  Re-Evaluation:   Oxygen  Discharge (Final Oxygen  Re-Evaluation):   Initial Exercise Prescription:  Initial Exercise Prescription - 07/23/23 0900       Date of Initial Exercise RX and Referring Provider   Date 07/23/23    Referring Provider Alyssa Backbone MD      Oxygen    Maintain Oxygen  Saturation 88% or higher      Treadmill   MPH 1.5    Grade 0.5    Minutes 15    METs 2.25      NuStep   Level 1    SPM 80    Minutes 15  METs 2.5       Prescription Details   Frequency (times per week) 2    Duration Progress to 30 minutes of continuous aerobic without signs/symptoms of physical distress      Intensity   THRR 40-80% of Max Heartrate 96-127    Ratings of Perceived Exertion 11-13    Perceived Dyspnea 0-4      Progression   Progression Continue to progress workloads to maintain intensity without signs/symptoms of physical distress.      Resistance Training   Training Prescription Yes    Weight 3 lb    Reps 10-15             Perform Capillary Blood Glucose checks as needed.  Exercise Prescription Changes:   Exercise Prescription Changes     Row Name 07/23/23 0900 07/25/23 1200 08/13/23 1500 08/27/23 1500 09/05/23 1500     Response to Exercise   Blood Pressure (Admit) 126/70 138/74 158/68 122/60 126/60   Blood Pressure (Exercise) 178/64 142/70 148/62 -- --   Blood Pressure (Exit) 128/70 120/68 142/62 122/62 90/50   Heart Rate (Admit) 64 bpm 86 bpm 60 bpm 71 bpm 73 bpm   Heart Rate (Exercise) 119 bpm 108 bpm 99 bpm 124 bpm 90 bpm   Heart Rate (Exit) 68 bpm 76 bpm 69 bpm 68 bpm 64 bpm   Oxygen  Saturation (Admit) 97 % -- -- -- --   Oxygen  Saturation (Exercise) 96 % -- -- -- --   Rating of Perceived Exertion (Exercise) 15 14 13 13 14    Perceived Dyspnea (Exercise) 1 -- -- -- --   Symptoms slightly SOB, fatigued at end -- -- -- --   Comments walk test results -- -- -- --   Duration -- Continue with 30 min of aerobic exercise without signs/symptoms of physical distress. Continue with 30 min of aerobic exercise without signs/symptoms of physical distress. Continue with 30 min of aerobic exercise without signs/symptoms of physical distress. Continue with 30 min of aerobic exercise without signs/symptoms of physical distress.   Intensity -- THRR unchanged THRR unchanged THRR unchanged THRR unchanged     Progression   Progression -- Continue to progress workloads to maintain intensity without signs/symptoms of  physical distress. Continue to progress workloads to maintain intensity without signs/symptoms of physical distress. Continue to progress workloads to maintain intensity without signs/symptoms of physical distress. Continue to progress workloads to maintain intensity without signs/symptoms of physical distress.     Resistance Training   Training Prescription -- Yes Yes Yes Yes   Weight -- 3 3 3 3    Reps -- 10-15 10-15 10-15 10-15     Treadmill   MPH -- 0.7 -- -- --   Grade -- 0 -- -- --   Minutes -- 15 -- -- --   METs -- 1.57 -- -- --     NuStep   Level -- 1 1 3 3    SPM -- 55 62 62 50   Minutes -- 10 15 15 15    METs -- 1.8 1.9 2 2.1     Track   Laps -- -- 22 30 20    Minutes -- -- 15 15 15    METs -- -- 1.9 2.3 1.87            Exercise Comments:   Exercise Comments     Row Name 07/25/23 1336           Exercise Comments First full day of exercise!  Patient was oriented to  gym and equipment including functions, settings, policies, and procedures.  Patient's individual exercise prescription and treatment plan were reviewed.  All starting workloads were established based on the results of the 6 minute walk test done at initial orientation visit.  The plan for exercise progression was also introduced and progression will be customized based on patient's performance and goals.                Exercise Goals and Review:   Exercise Goals     Row Name 07/23/23 0953             Exercise Goals   Increase Physical Activity Yes       Intervention Provide advice, education, support and counseling about physical activity/exercise needs.;Develop an individualized exercise prescription for aerobic and resistive training based on initial evaluation findings, risk stratification, comorbidities and participant's personal goals.       Expected Outcomes Short Term: Attend rehab on a regular basis to increase amount of physical activity.;Long Term: Add in home exercise to make  exercise part of routine and to increase amount of physical activity.;Long Term: Exercising regularly at least 3-5 days a week.       Increase Strength and Stamina Yes       Intervention Provide advice, education, support and counseling about physical activity/exercise needs.;Develop an individualized exercise prescription for aerobic and resistive training based on initial evaluation findings, risk stratification, comorbidities and participant's personal goals.       Expected Outcomes Short Term: Increase workloads from initial exercise prescription for resistance, speed, and METs.;Short Term: Perform resistance training exercises routinely during rehab and add in resistance training at home;Long Term: Improve cardiorespiratory fitness, muscular endurance and strength as measured by increased METs and functional capacity ( )       Able to understand and use rate of perceived exertion (RPE) scale Yes       Intervention Provide education and explanation on how to use RPE scale       Expected Outcomes Short Term: Able to use RPE daily in rehab to express subjective intensity level;Long Term:  Able to use RPE to guide intensity level when exercising independently       Able to understand and use Dyspnea scale Yes       Intervention Provide education and explanation on how to use Dyspnea scale       Expected Outcomes Short Term: Able to use Dyspnea scale daily in rehab to express subjective sense of shortness of breath during exertion;Long Term: Able to use Dyspnea scale to guide intensity level when exercising independently       Knowledge and understanding of Target Heart Rate Range (THRR) Yes       Intervention Provide education and explanation of THRR including how the numbers were predicted and where they are located for reference       Expected Outcomes Short Term: Able to state/look up THRR;Long Term: Able to use THRR to govern intensity when exercising independently;Short Term: Able to use daily  as guideline for intensity in rehab       Able to check pulse independently Yes       Intervention Provide education and demonstration on how to check pulse in carotid and radial arteries.;Review the importance of being able to check your own pulse for safety during independent exercise       Expected Outcomes Short Term: Able to explain why pulse checking is important during independent exercise;Long Term: Able to check pulse independently and accurately  Understanding of Exercise Prescription Yes       Intervention Provide education, explanation, and written materials on patient's individual exercise prescription       Expected Outcomes Short Term: Able to explain program exercise prescription;Long Term: Able to explain home exercise prescription to exercise independently                Exercise Goals Re-Evaluation :  Exercise Goals Re-Evaluation     Row Name 07/25/23 1336 07/29/23 1231 09/10/23 1401         Exercise Goal Re-Evaluation   Exercise Goals Review Able to understand and use rate of perceived exertion (RPE) scale;Knowledge and understanding of Target Heart Rate Range (THRR);Able to understand and use Dyspnea scale;Understanding of Exercise Prescription Increase Physical Activity;Increase Strength and Stamina;Understanding of Exercise Prescription Increase Physical Activity;Increase Strength and Stamina;Understanding of Exercise Prescription     Comments Reviewed RPE and dyspnea scale, THR and program prescription with pt today.  Pt voiced understanding and was given a copy of goals to take home. Samantha Wood just started the rehab program and had a good fist sesson. She is getting use to using the treadmill and nustep. She is exercising at an RPE of 14. Will continue to montior and progress as able, Samantha Wood is doing well in rehab. She feels like she has gotten her strength back but still working on stamina.  She also says it is much better than it was previously.  On her off days she is  doing more house work and helping her husband with fence and building maintenance.  They still go dancing twice a week on Tuesday nights and every other Saturday.  It is her favorite activity and they look forward to it every week.  She has been able to get out in her garden and do the weed eating as well.     Expected Outcomes Short: Use RPE daily to regulate intensity.  Long: Follow program prescription in THR. Continue to attend rehab Short: Continue to exercise more on her off days from rehab.  Long; Continue to improve stamina               Discharge Exercise Prescription (Final Exercise Prescription Changes):  Exercise Prescription Changes - 09/05/23 1500       Response to Exercise   Blood Pressure (Admit) 126/60    Blood Pressure (Exit) 90/50    Heart Rate (Admit) 73 bpm    Heart Rate (Exercise) 90 bpm    Heart Rate (Exit) 64 bpm    Rating of Perceived Exertion (Exercise) 14    Duration Continue with 30 min of aerobic exercise without signs/symptoms of physical distress.    Intensity THRR unchanged      Progression   Progression Continue to progress workloads to maintain intensity without signs/symptoms of physical distress.      Resistance Training   Training Prescription Yes    Weight 3    Reps 10-15      NuStep   Level 3    SPM 50    Minutes 15    METs 2.1      Track   Laps 20    Minutes 15    METs 1.87             Nutrition:  Target Goals: Understanding of nutrition guidelines, daily intake of sodium 1500mg , cholesterol 200mg , calories 30% from fat and 7% or less from saturated fats, daily to have 5 or more servings of fruits and vegetables.  Biometrics:  Pre Biometrics - 07/23/23 0953       Pre Biometrics   Height 5\' 3"  (1.6 m)    Weight 200 lb 3.2 oz (90.8 kg)    Waist Circumference 35 inches    Hip Circumference 46.5 inches    Waist to Hip Ratio 0.75 %    BMI (Calculated) 35.47    Grip Strength 17.1 kg    Single Leg Stand 4.8 seconds               Nutrition Therapy Plan and Nutrition Goals:  Nutrition Therapy & Goals - 07/23/23 0959       Intervention Plan   Intervention Prescribe, educate and counsel regarding individualized specific dietary modifications aiming towards targeted core components such as weight, hypertension, lipid management, diabetes, heart failure and other comorbidities.;Nutrition handout(s) given to patient.    Expected Outcomes Short Term Goal: Understand basic principles of dietary content, such as calories, fat, sodium, cholesterol and nutrients.;Long Term Goal: Adherence to prescribed nutrition plan.             Nutrition Assessments:  MEDIFICTS Score Key: >=70 Need to make dietary changes  40-70 Heart Healthy Diet <= 40 Therapeutic Level Cholesterol Diet  Flowsheet Row CARDIAC REHAB PHASE II ORIENTATION from 07/23/2023 in [facility] CARDIAC REHABILITATION  Picture Your Plate Total Score on Admission 59      Picture Your Plate Scores: <29 Unhealthy dietary pattern with much room for improvement. 41-50 Dietary pattern unlikely to meet recommendations for good health and room for improvement. 51-60 More healthful dietary pattern, with some room for improvement.  >60 Healthy dietary pattern, although there may be some specific behaviors that could be improved.    Nutrition Goals Re-Evaluation:  Nutrition Goals Re-Evaluation     Row Name 09/10/23 1418             Goals   Nutrition Goal Heart Healthy Eating       Comment Samantha Wood is doing well in rehab.  She has been working on her diet and has cut back on her bacon.  She is proud of herself for this.  She is also realizing that if she has a craving then she can have a little bit to satisfy her craving and then let it go. She is living by the everything in moderation.  She is watch her sodium more and trying to eat better overall.       Expected Outcome Short; COntinue to use moderation Long: continue to follow heart healthy  guidelines                Nutrition Goals Discharge (Final Nutrition Goals Re-Evaluation):  Nutrition Goals Re-Evaluation - 09/10/23 1418       Goals   Nutrition Goal Heart Healthy Eating    Comment Samantha Wood is doing well in rehab.  She has been working on her diet and has cut back on her bacon.  She is proud of herself for this.  She is also realizing that if she has a craving then she can have a little bit to satisfy her craving and then let it go. She is living by the everything in moderation.  She is watch her sodium more and trying to eat better overall.    Expected Outcome Short; COntinue to use moderation Long: continue to follow heart healthy guidelines             Psychosocial: Target Goals: Acknowledge presence or absence of significant depression and/or stress, maximize coping skills,  provide positive support system. Participant is able to verbalize types and ability to use techniques and skills needed for reducing stress and depression.  Initial Review & Psychosocial Screening:  Initial Psych Review & Screening - 07/23/23 0846       Initial Review   Current issues with Current Psychotropic Meds;Current Stress Concerns    Source of Stress Concerns Chronic Illness;Unable to participate in former interests or hobbies;Unable to perform yard/household activities    Comments has lorapam for anixety uses in small doses as needed      Family Dynamics   Good Support System? Yes   Lovely Rubins ([place]), daughter in law Mariah Shines (leans on her too)   Concerns Recent loss of child    Comments Only son passed last February      Barriers   Psychosocial barriers to participate in program The patient should benefit from training in stress management and relaxation.;Psychosocial barriers identified (see note)      Screening Interventions   Interventions Encouraged to exercise;Provide feedback about the scores to participant;To provide support and resources with identified psychosocial  needs    Expected Outcomes Short Term goal: Utilizing psychosocial counselor, staff and physician to assist with identification of specific Stressors or current issues interfering with healing process. Setting desired goal for each stressor or current issue identified.;Long Term Goal: Stressors or current issues are controlled or eliminated.;Short Term goal: Identification and review with participant of any Quality of Life or Depression concerns found by scoring the questionnaire.;Long Term goal: The participant improves quality of Life and PHQ9 Scores as seen by post scores and/or verbalization of changes             Quality of Life Scores:  Quality of Life - 07/23/23 0958       Quality of Life   Select Quality of Life      Quality of Life Scores   Health/Function Pre 29.6 %    Socioeconomic Pre 30 %    Psych/Spiritual Pre 30 %    Family Pre 30 %    GLOBAL Pre 29.83 %            Scores of 19 and below usually indicate a poorer quality of life in these areas.  A difference of  2-3 points is a clinically meaningful difference.  A difference of 2-3 points in the total score of the Quality of Life Index has been associated with significant improvement in overall quality of life, self-image, physical symptoms, and general health in studies assessing change in quality of life.  PHQ-9: Review Flowsheet       07/23/2023  Depression screen PHQ 2/9  Decreased Interest 0  Down, Depressed, Hopeless 0  PHQ - 2 Score 0  Altered sleeping 0  Tired, decreased energy 0  Change in appetite 0  Feeling bad or failure about yourself  0  Trouble concentrating 0  Moving slowly or fidgety/restless 0  Suicidal thoughts 0  PHQ-9 Score 0  Difficult doing work/chores Not difficult at all   Interpretation of Total Score  Total Score Depression Severity:  1-4 = Minimal depression, 5-9 = Mild depression, 10-14 = Moderate depression, 15-19 = Moderately severe depression, 20-27 = Severe depression    Psychosocial Evaluation and Intervention:  Psychosocial Evaluation - 07/23/23 0954       Psychosocial Evaluation & Interventions   Interventions Stress management education;Relaxation education;Encouraged to exercise with the program and follow exercise prescription    Comments Samantha Wood is coming into cardiac rehab after  a TAVR.  She also had a stent placed back in December and was unable to start rehab due to TAVR workup. She is eager to get going and has begun to regain her strength already. She wants to get all her strength back and to feel better overall. She lives with her friend Samuel Crock and relies heavily on him.  She does have a history of anxiety but tires to only take a 1/4 tab of lorazapam at a time.  She usually sleeps well but on occassion will wake at night and stress over the loss of her son again and then she will take a full dose to get back to sleep.  She lost her son a year ago in February.  It was unexpected and still hard on both her and her daughter in law.  They lean on each other for support.  She and Samuel Crock go dancing every week and weekend at the [facility] for exercise and to get out and about.  She is eager to get dancing without stopping again.  She also enjoys wood turning and painting in her down time.  She has no barriers to attending rehab and plans to come twice a week.    Expected Outcomes Short; Attend rehab regularly to build up stamina Long: Able to continue to go dancing every week    Continue Psychosocial Services  Follow up required by staff             Psychosocial Re-Evaluation:  Psychosocial Re-Evaluation     Row Name 09/10/23 1404             Psychosocial Re-Evaluation   Current issues with Current Sleep Concerns;Current Stress Concerns;Current Anxiety/Panic;Current Psychotropic Meds       Comments Samantha Wood is doing well in rehab.  She is feeling better overall and feels like she has gotten to a good place mentally.  She admits that there are some nights that  she wakes thinking about her son and not able to get back to sleep. She is still dancing each week and looks forward to it each week.  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  She enjoys doing word puzzles and feels like her brain is now more alert than it was before.       Expected Outcomes Short: Continue to dance for her joy Long: Continue to exercise for mental boost       Interventions Encouraged to attend Cardiac Rehabilitation for the exercise;Stress management education       Continue Psychosocial Services  Follow up required by staff                Psychosocial Discharge (Final Psychosocial Re-Evaluation):  Psychosocial Re-Evaluation - 09/10/23 1404       Psychosocial Re-Evaluation   Current issues with Current Sleep Concerns;Current Stress Concerns;Current Anxiety/Panic;Current Psychotropic Meds    Comments Samantha Wood is doing well in rehab.  She is feeling better overall and feels like she has gotten to a good place mentally.  She admits that there are some nights that she wakes thinking about her son and not able to get back to sleep. She is still dancing each week and looks forward to it each week.  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  She enjoys doing word puzzles and feels like her brain is now more  alert than it was before.    Expected Outcomes Short: Continue to dance for her joy Long: Continue to exercise for mental boost    Interventions Encouraged to attend Cardiac Rehabilitation for the exercise;Stress management education    Continue Psychosocial Services  Follow up required by staff             Vocational Rehabilitation: Provide vocational rehab assistance to qualifying candidates.   Vocational Rehab Evaluation & Intervention:  Vocational Rehab - 07/23/23 0848       Initial Vocational Rehab Evaluation & Intervention    Assessment Wood need for Vocational Rehabilitation No   retired            Education: Education Goals: Education classes will be provided on a weekly basis, covering required topics. Participant will state understanding/return demonstration of topics presented.  Learning Barriers/Preferences:  Learning Barriers/Preferences - 07/23/23 0844       Learning Barriers/Preferences   Learning Barriers Sight   glasses for reading   Learning Preferences None             Education Topics: Hypertension, Hypertension Reduction -Define heart disease and high blood pressure. Discus how high blood pressure affects the body and ways to reduce high blood pressure.   Exercise and Your Heart -Discuss why it is important to exercise, the FITT principles of exercise, normal and abnormal responses to exercise, and how to exercise safely. Flowsheet Row CARDIAC REHAB PHASE II EXERCISE from 09/05/2023 in [place] CARDIAC REHABILITATION  Date 09/05/23  Educator hb  Instruction Review Code 1- Verbalizes Understanding       Angina -Discuss definition of angina, causes of angina, treatment of angina, and how to decrease risk of having angina. Flowsheet Row CARDIAC REHAB PHASE II EXERCISE from 09/05/2023 in [place] CARDIAC REHABILITATION  Date 08/29/23  [tests/procedures]  Educator HB  Instruction Review Code 1- Verbalizes Understanding       Cardiac Medications -Review what the following cardiac medications are used for, how they affect the body, and side effects that may occur when taking the medications.  Medications include Aspirin , Beta blockers, calcium channel blockers, ACE Inhibitors, angiotensin receptor blockers, diuretics, digoxin, and antihyperlipidemics. Flowsheet Row CARDIAC REHAB PHASE II EXERCISE from 09/05/2023 in [place] CARDIAC REHABILITATION  Date 08/15/23  Educator hb  Instruction Review Code 1- Verbalizes Understanding       Congestive Heart  Failure -Discuss the definition of CHF, how to live with CHF, the signs and symptoms of CHF, and how keep track of weight and sodium intake. Flowsheet Row CARDIAC REHAB PHASE II EXERCISE from 09/05/2023 in [place] CARDIAC REHABILITATION  Date 08/08/23  Educator [facility]  Instruction Review Code 1- Verbalizes Understanding       Heart Disease and Intimacy -Discus the effect sexual activity has on the heart, how changes occur during intimacy as we age, and safety during sexual activity.   Smoking Cessation / COPD -Discuss different methods to quit smoking, the health benefits of quitting smoking, and the definition of COPD.   Nutrition I: Fats -Discuss the types of cholesterol, what cholesterol does to the heart, and how cholesterol levels can be controlled. Flowsheet Row CARDIAC REHAB PHASE II EXERCISE from 09/05/2023 in [place] CARDIAC REHABILITATION  Date 07/25/23  Educator [facility]  Instruction Review Code 1- Verbalizes Understanding       Nutrition II: Labels -Discuss the different components of food labels and how to read food label   Heart Parts/Heart Disease and PAD -Discuss the anatomy  of the heart, the pathway of blood circulation through the heart, and these are affected by heart disease.   Stress I: Signs and Symptoms -Discuss the causes of stress, how stress may lead to anxiety and depression, and ways to limit stress. Flowsheet Row CARDIAC REHAB PHASE II EXERCISE from 09/05/2023 in [place] CARDIAC REHABILITATION  Date 08/01/23  Educator DJ  Instruction Review Code 1- Verbalizes Understanding       Stress II: Relaxation -Discuss different types of relaxation techniques to limit stress.   Warning Signs of Stroke / TIA -Discuss definition of a stroke, what the signs and symptoms are of a stroke, and how to identify when someone is having stroke.   Knowledge Questionnaire Score:  Knowledge Questionnaire Score - 07/23/23 0959       Knowledge Questionnaire  Score   Pre Score 22/26             Core Components/Risk Factors/Patient Goals at Admission:  Personal Goals and Risk Factors at Admission - 07/23/23 0959       Core Components/Risk Factors/Patient Goals on Admission    Weight Management Yes;Obesity;Weight Loss    Intervention Weight Management: Develop a combined nutrition and exercise program designed to reach desired caloric intake, while maintaining appropriate intake of nutrient and fiber, sodium and fats, and appropriate energy expenditure required for the weight goal.;Weight Management: Provide education and appropriate resources to help participant work on and attain dietary goals.;Weight Management/Obesity: Establish reasonable short term and long term weight goals.;Obesity: Provide education and appropriate resources to help participant work on and attain dietary goals.    Admit Weight 200 lb 3.2 oz (90.8 kg)    Goal Weight: Short Term 195 lb (88.5 kg)    Goal Weight: Long Term 190 lb (86.2 kg)    Expected Outcomes Short Term: Continue to assess and modify interventions until short term weight is achieved;Long Term: Adherence to nutrition and physical activity/exercise program aimed toward attainment of established weight goal;Weight Loss: Understanding of general recommendations for a balanced deficit meal plan, which promotes 1-2 lb weight loss per week and includes a negative energy balance of [phone] kcal/d;Understanding recommendations for meals to include 15-35% energy as protein, 25-35% energy from fat, 35-60% energy from carbohydrates, less than 200mg  of dietary cholesterol, 20-35 gm of total fiber daily;Understanding of distribution of calorie intake throughout the day with the consumption of 4-5 meals/snacks    Hypertension Yes    Intervention Provide education on lifestyle modifcations including regular physical activity/exercise, weight management, moderate sodium restriction and increased consumption of fresh fruit,  vegetables, and low fat dairy, alcohol  moderation, and smoking cessation.;Monitor prescription use compliance.    Expected Outcomes Short Term: Continued assessment and intervention until BP is < 140/41mm HG in hypertensive participants. < 130/107mm HG in hypertensive participants with diabetes, heart failure or chronic kidney disease.;Long Term: Maintenance of blood pressure at goal levels.    Lipids Yes    Intervention Provide education and support for participant on nutrition & aerobic/resistive exercise along with prescribed medications to achieve LDL 70mg , HDL >40mg .    Expected Outcomes Short Term: Participant states understanding of desired cholesterol values and is compliant with medications prescribed. Participant is following exercise prescription and nutrition guidelines.;Long Term: Cholesterol controlled with medications as prescribed, with individualized exercise RX and with personalized nutrition plan. Value goals: LDL < 70mg , HDL > 40 mg.             Core Components/Risk Factors/Patient Goals Review:   Goals and Risk  Factor Review     Row Name 09/10/23 1420             Core Components/Risk Factors/Patient Goals Review   Personal Goals Review Weight Management/Obesity;Hypertension;Lipids       Review Samantha Wood is doing well in rehab.  She is maintianing her weight and pleased with how she is doing.  Her pressures have been good for most part. She has had a couple of low readings but only one bout of dizziness with it that passed quickly.  She is trying to drink enough. She also mentioned that if her weight creeps up she will take and extra fluid pill.       Expected Outcomes Short: Conitnue to watch bp and weight closely Long; continue to montior risk factors                Core Components/Risk Factors/Patient Goals at Discharge (Final Review):   Goals and Risk Factor Review - 09/10/23 1420       Core Components/Risk Factors/Patient Goals Review   Personal Goals Review  Weight Management/Obesity;Hypertension;Lipids    Review Samantha Wood is doing well in rehab.  She is maintianing her weight and pleased with how she is doing.  Her pressures have been good for most part. She has had a couple of low readings but only one bout of dizziness with it that passed quickly.  She is trying to drink enough. She also mentioned that if her weight creeps up she will take and extra fluid pill.    Expected Outcomes Short: Conitnue to watch bp and weight closely Long; continue to montior risk factors             ITP Comments:  ITP Comments     Row Name 07/23/23 0951 07/25/23 1336 08/14/23 1159 09/11/23 1504     ITP Comments Patient attend orientation today.  Patient is attending Cardiac Rehabilitation Program.  Documentation for diagnosis can be found in [facility] 07/10/23.  Reviewed medical chart, RPE/RPD, gym safety and program guidelines.  Patient was fitted to equipment they will be using during rehab.  Patient is scheduled to start exercise on Thursday 07/25/23 at 1330.   Initial ITP created and sent for review and signature by Dr. Armida Lander, Medical Director for Cardiac Rehabilitation Program. First full day of exercise!  Patient was oriented to gym and equipment including functions, settings, policies, and procedures.  Patient's individual exercise prescription and treatment plan were reviewed.  All starting workloads were established based on the results of the 6 minute walk test done at initial orientation visit.  The plan for exercise progression was also introduced and progression will be customized based on patient's performance and goals. 30 day review completed. ITP sent to Dr. Armida Lander, Medical Director of Cardiac Rehab. Continue with ITP unless changes are made by physician.  Newer to program 30 day review completed. ITP sent to Dr. Armida Lander, Medical Director of Cardiac Rehab. Continue with ITP unless changes are made by physician.             Comments: 30  day review

## 2023-09-12 ENCOUNTER — Encounter (HOSPITAL_COMMUNITY)
Admission: RE | Admit: 2023-09-12 | Discharge: 2023-09-12 | Disposition: A | Discharge status: Home / Self Care | Source: Ambulatory Visit | Attending: Internal Medicine

## 2023-09-12 DIAGNOSIS — Z952 Presence of prosthetic heart valve: Secondary | ICD-10-CM | POA: Diagnosis not present

## 2023-09-12 NOTE — Progress Notes (Signed)
 Daily Session Note  Patient Details  Name: Samantha Wood MRN: [id number] Date of Birth: 1946/02/09 Referring Provider:   Flowsheet Row CARDIAC REHAB PHASE II ORIENTATION from 07/23/2023 in [facility] CARDIAC REHABILITATION  Referring Provider Alyssa Backbone MD       Encounter Date: 09/12/2023  Check In:  Session Check In - 09/12/23 1330       Check-In   Supervising physician immediately available to respond to emergencies See telemetry face sheet for immediately available MD    Location [facility]    Staff Present Clotilda Danish, BS, Exercise Physiologist;Jessica Zoila Hines, MA, RCEP, CCRP, CCET    Virtual Visit No    Medication changes reported     No    Fall or balance concerns reported    No    Tobacco Cessation No Change    Warm-up and Cool-down Performed on first and last piece of equipment    Resistance Training Performed Yes    VAD Patient? No    PAD/SET Patient? No      Pain Assessment   Currently in Pain? No/denies    Pain Score 0-No pain    Multiple Pain Sites No             Capillary Blood Glucose: No results found for this or any previous visit (from the past 24 hours).    Social History   Tobacco Use  Smoking Status Never  Smokeless Tobacco Never    Goals Met:  Independence with exercise equipment Exercise tolerated well No report of concerns or symptoms today Strength training completed today  Goals Unmet:  Not Applicable  Comments: Pt able to follow exercise prescription today without complaint.  Will continue to monitor for progression.

## 2023-09-17 ENCOUNTER — Encounter (HOSPITAL_COMMUNITY)
Admission: RE | Admit: 2023-09-17 | Discharge: 2023-09-17 | Disposition: A | Discharge status: Home / Self Care | Source: Ambulatory Visit | Attending: Internal Medicine | Admitting: Internal Medicine

## 2023-09-17 DIAGNOSIS — Z952 Presence of prosthetic heart valve: Secondary | ICD-10-CM

## 2023-09-17 NOTE — Progress Notes (Signed)
 Daily Session Note  Patient Details  Name: Samantha Wood MRN: [id number] Date of Birth: [birth year] Referring Provider:   Flowsheet Row CARDIAC REHAB PHASE II ORIENTATION from 07/23/2023 in [facility] CARDIAC REHABILITATION  Referring Provider Alyssa Backbone MD       Encounter Date: 09/17/2023  Check In:  Session Check In - 09/17/23 1315       Check-In   Supervising physician immediately available to respond to emergencies See telemetry face sheet for immediately available MD    Location [facility]    Staff Present Clotilda Danish, BS, Exercise Physiologist;Vinay Ertl Eilene Grater, RN;Brittany Annette Barters, BSN, RN, WTA-C    Virtual Visit No    Medication changes reported     No    Fall or balance concerns reported    No    Warm-up and Cool-down Performed on first and last piece of equipment    Resistance Training Performed Yes    VAD Patient? No    PAD/SET Patient? No      Pain Assessment   Currently in Pain? No/denies             Capillary Blood Glucose: No results found for this or any previous visit (from the past 24 hours).    Social History   Tobacco Use  Smoking Status Never  Smokeless Tobacco Never    Goals Met:  Independence with exercise equipment Exercise tolerated well Strength training completed today  Goals Unmet:  Not Applicable  Comments: Pt able to follow exercise prescription today without complaint.  Will continue to monitor for progression.

## 2023-09-18 ENCOUNTER — Encounter (HOSPITAL_COMMUNITY)
Admission: RE | Admit: 2023-09-18 | Discharge: 2023-09-18 | Discharge status: Home / Self Care | Source: Ambulatory Visit | Attending: Internal Medicine

## 2023-09-18 DIAGNOSIS — Z952 Presence of prosthetic heart valve: Secondary | ICD-10-CM

## 2023-09-18 NOTE — Progress Notes (Signed)
 Daily Session Note  Patient Details  Name: Samantha Wood MRN: [id number] Date of Birth: November 30, 1945 Referring Provider:   Flowsheet Row CARDIAC REHAB PHASE II ORIENTATION from 07/23/2023 in [facility] CARDIAC REHABILITATION  Referring Provider Alyssa Backbone MD       Encounter Date: 09/18/2023  Check In:  Session Check In - 09/18/23 0808       Check-In   Supervising physician immediately available to respond to emergencies See telemetry face sheet for immediately available MD    Location [facility]    Staff Present Almeda Aris, RN;Kregg Cihlar [place], MA, RCEP, CCRP, CCET    Virtual Visit No    Medication changes reported     No    Fall or balance concerns reported    No    Warm-up and Cool-down Performed on first and last piece of equipment    Resistance Training Performed Yes    VAD Patient? No    PAD/SET Patient? No      Pain Assessment   Currently in Pain? No/denies             Capillary Blood Glucose: No results found for this or any previous visit (from the past 24 hours).    Social History   Tobacco Use  Smoking Status Never  Smokeless Tobacco Never    Goals Met:  Independence with exercise equipment Exercise tolerated well No report of concerns or symptoms today Strength training completed today  Goals Unmet:  Not Applicable  Comments: Pt able to follow exercise prescription today without complaint.  Will continue to monitor for progression.

## 2023-09-19 ENCOUNTER — Encounter (HOSPITAL_COMMUNITY)

## 2023-09-24 ENCOUNTER — Encounter (HOSPITAL_COMMUNITY)

## 2023-09-24 DIAGNOSIS — L570 Actinic keratosis: Secondary | ICD-10-CM | POA: Diagnosis not present

## 2023-09-25 ENCOUNTER — Encounter (HOSPITAL_COMMUNITY)
Admission: RE | Admit: 2023-09-25 | Discharge: 2023-09-25 | Disposition: A | Discharge status: Home / Self Care | Source: Ambulatory Visit | Attending: Internal Medicine | Admitting: Internal Medicine

## 2023-09-25 DIAGNOSIS — Z952 Presence of prosthetic heart valve: Secondary | ICD-10-CM | POA: Diagnosis not present

## 2023-09-26 ENCOUNTER — Encounter (HOSPITAL_COMMUNITY)
Admission: RE | Admit: 2023-09-26 | Discharge: 2023-09-26 | Disposition: A | Discharge status: Home / Self Care | Source: Ambulatory Visit | Attending: Internal Medicine | Admitting: Internal Medicine

## 2023-09-26 DIAGNOSIS — Z952 Presence of prosthetic heart valve: Secondary | ICD-10-CM | POA: Diagnosis not present

## 2023-09-26 NOTE — Progress Notes (Signed)
 Daily Session Note  Patient Details  Name: LAKEN HAYES MRN: [id number] Date of Birth: 1946-01-15 Referring Provider:   Flowsheet Row CARDIAC REHAB PHASE II ORIENTATION from 07/23/2023 in [facility] CARDIAC REHABILITATION  Referring Provider Alyssa Backbone MD       Encounter Date: 09/26/2023  Check In:  Session Check In - 09/26/23 1337       Check-In   Supervising physician immediately available to respond to emergencies See telemetry face sheet for immediately available MD    Location [facility]    Staff Present Clotilda Danish, BS, Exercise Physiologist;Hillary [place] BSN, RN;Cammeron Greis [place], MA, RCEP, CCRP, CCET    Virtual Visit No    Medication changes reported     No    Fall or balance concerns reported    No    Warm-up and Cool-down Performed on first and last piece of equipment    Resistance Training Performed Yes    VAD Patient? No    PAD/SET Patient? No      Pain Assessment   Currently in Pain? No/denies             Capillary Blood Glucose: No results found for this or any previous visit (from the past 24 hours).    Social History   Tobacco Use  Smoking Status Never  Smokeless Tobacco Never    Goals Met:  Proper associated with RPD/PD & O2 Sat Independence with exercise equipment Exercise tolerated well No report of concerns or symptoms today Strength training completed today  Goals Unmet:  Not Applicable  Comments: Pt able to follow exercise prescription today without complaint.  Will continue to monitor for progression.

## 2023-10-01 ENCOUNTER — Encounter (HOSPITAL_COMMUNITY)

## 2023-10-03 ENCOUNTER — Encounter (HOSPITAL_COMMUNITY)
Admission: RE | Admit: 2023-10-03 | Discharge: 2023-10-03 | Disposition: A | Discharge status: Home / Self Care | Source: Ambulatory Visit | Attending: Internal Medicine | Admitting: Internal Medicine

## 2023-10-03 DIAGNOSIS — Z952 Presence of prosthetic heart valve: Secondary | ICD-10-CM | POA: Diagnosis not present

## 2023-10-03 NOTE — Progress Notes (Signed)
 Daily Session Note  Patient Details  Name: Samantha Wood MRN: [id number] Date of Birth: 1945-11-22 Referring Provider:   Flowsheet Row CARDIAC REHAB PHASE II ORIENTATION from 07/23/2023 in [facility] CARDIAC REHABILITATION  Referring Provider Alyssa Backbone MD       Encounter Date: 10/03/2023  Check In:  Session Check In - 10/03/23 1315       Check-In   Supervising physician immediately available to respond to emergencies See telemetry face sheet for immediately available MD    Location [facility]    Staff Present Ronna Coho BSN, RN;Heather Toy Freund, BS, Exercise Physiologist;Jessica [place], MA, RCEP, CCRP, CCET    Virtual Visit No    Medication changes reported     No    Fall or balance concerns reported    No    Tobacco Cessation No Change    Warm-up and Cool-down Performed on first and last piece of equipment    Resistance Training Performed Yes    VAD Patient? No    PAD/SET Patient? No      Pain Assessment   Currently in Pain? No/denies    Pain Score 0-No pain    Multiple Pain Sites No             Capillary Blood Glucose: No results found for this or any previous visit (from the past 24 hours).    Social History   Tobacco Use  Smoking Status Never  Smokeless Tobacco Never    Goals Met:  Proper associated with RPD/PD & O2 Sat Independence with exercise equipment Exercise tolerated well No report of concerns or symptoms today Strength training completed today  Goals Unmet:  Not Applicable  Comments: Samantha AasAaron AasPt able to follow exercise prescription today without complaint.  Will continue to monitor for progression.

## 2023-10-07 DIAGNOSIS — L03114 Cellulitis of left upper limb: Secondary | ICD-10-CM | POA: Diagnosis not present

## 2023-10-07 DIAGNOSIS — W5501XA Bitten by cat, initial encounter: Secondary | ICD-10-CM | POA: Diagnosis not present

## 2023-10-08 ENCOUNTER — Encounter (HOSPITAL_COMMUNITY)
Admission: RE | Admit: 2023-10-08 | Discharge: 2023-10-08 | Disposition: A | Discharge status: Home / Self Care | Source: Ambulatory Visit | Attending: Internal Medicine | Admitting: Internal Medicine

## 2023-10-08 VITALS — Ht 63.0 in | Wt 198.4 lb

## 2023-10-08 DIAGNOSIS — Z952 Presence of prosthetic heart valve: Secondary | ICD-10-CM | POA: Diagnosis not present

## 2023-10-08 NOTE — Progress Notes (Signed)
 Daily Session Note  Patient Details  Name: JONAYA FRESHOUR MRN: [id number] Date of Birth: 02-Mar-1946 Referring Provider:   Flowsheet Row CARDIAC REHAB PHASE II ORIENTATION from 07/23/2023 in [facility] CARDIAC REHABILITATION  Referring Provider Alyssa Backbone MD       Encounter Date: 10/08/2023  Check In:  Session Check In - 10/08/23 1330       Check-In   Supervising physician immediately available to respond to emergencies See telemetry face sheet for immediately available MD    Location [facility]    Staff Present Clotilda Danish, BS, Exercise Physiologist;Brittany Annette Barters, BSN, RN, WTA-C;Amanda Steuart, RN;Jessica [place], MA, RCEP, CCRP, CCET    Virtual Visit No    Medication changes reported     No    Fall or balance concerns reported    No    Warm-up and Cool-down Performed on first and last piece of equipment    Resistance Training Performed Yes    VAD Patient? No    PAD/SET Patient? No      Pain Assessment   Currently in Pain? No/denies    Pain Score 0-No pain    Multiple Pain Sites No             Capillary Blood Glucose: No results found for this or any previous visit (from the past 24 hours).    Social History   Tobacco Use  Smoking Status Never  Smokeless Tobacco Never    Goals Met:  Independence with exercise equipment Exercise tolerated well No report of concerns or symptoms today Strength training completed today  Goals Unmet:  Not Applicable  Comments: Pt able to follow exercise prescription today without complaint.  Will continue to monitor for progression.

## 2023-10-08 NOTE — Patient Instructions (Signed)
 Discharge Patient Instructions  Patient Details  Name: Samantha Wood MRN: [id number] Date of Birth: 01-May-1946 Referring Provider:  Leesa Pulling, MD   Number of Visits: 36  Reason for Discharge:  Patient reached a stable level of exercise. Patient independent in their exercise. Patient has met program and personal goals.  Diagnosis:  S/P TAVR (transcatheter aortic valve replacement)  Initial Exercise Prescription:  Initial Exercise Prescription - 07/23/23 0900       Date of Initial Exercise RX and Referring Provider   Date 07/23/23    Referring Provider Alyssa Backbone MD      Oxygen    Maintain Oxygen  Saturation 88% or higher      Treadmill   MPH 1.5    Grade 0.5    Minutes 15    METs 2.25      NuStep   Level 1    SPM 80    Minutes 15    METs 2.5      Prescription Details   Frequency (times per week) 2    Duration Progress to 30 minutes of continuous aerobic without signs/symptoms of physical distress      Intensity   THRR 40-80% of Max Heartrate 96-127    Ratings of Perceived Exertion 11-13    Perceived Dyspnea 0-4      Progression   Progression Continue to progress workloads to maintain intensity without signs/symptoms of physical distress.      Resistance Training   Training Prescription Yes    Weight 3 lb    Reps 10-15             Discharge Exercise Prescription (Final Exercise Prescription Changes):  Exercise Prescription Changes - 09/05/23 1500       Response to Exercise   Blood Pressure (Admit) 126/60    Blood Pressure (Exit) 90/50    Heart Rate (Admit) 73 bpm    Heart Rate (Exercise) 90 bpm    Heart Rate (Exit) 64 bpm    Rating of Perceived Exertion (Exercise) 14    Duration Continue with 30 min of aerobic exercise without signs/symptoms of physical distress.    Intensity THRR unchanged      Progression   Progression Continue to progress workloads to maintain intensity without signs/symptoms of physical distress.       Resistance Training   Training Prescription Yes    Weight 3    Reps 10-15      NuStep   Level 3    SPM 50    Minutes 15    METs 2.1      Track   Laps 20    Minutes 15    METs 1.87             Functional Capacity:  6 Minute Walk     Row Name 07/23/23 0951 10/08/23 1510       6 Minute Walk   Phase Initial Discharge    Distance 1238 feet 1330 feet    Distance Feet Change -- 92 ft    Walk Time 6 minutes 6 minutes    # of Rest Breaks 0 0    MPH 2.34 2.52    METS 2.53 2.19    RPE 15 12    Perceived Dyspnea  1 1    VO2 Peak 8.85 7.67    Symptoms Yes (comment) No    Comments slightly winded, fatgiued at end --    Resting HR 64 bpm 67 bpm    Resting BP 126/70  118/60    Resting Oxygen  Saturation  97 % 97 %    Exercise Oxygen  Saturation  during 6 min walk 96 % 97 %    Max Ex. HR 119 bpm 110 bpm    Max Ex. BP 178/64 130/70    2 Minute Post BP 128/70 120/60            Nutrition & Weight - Outcomes:  Pre Biometrics - 07/23/23 0953       Pre Biometrics   Height 5\' 3"  (1.6 m)    Weight 90.8 kg    Waist Circumference 35 inches    Hip Circumference 46.5 inches    Waist to Hip Ratio 0.75 %    BMI (Calculated) 35.47    Grip Strength 17.1 kg    Single Leg Stand 4.8 seconds             Post Biometrics - 10/08/23 1513        Post  Biometrics   Height 5\' 3"  (1.6 m)    Weight 90 kg    Waist Circumference 35 inches    Hip Circumference 45 inches    Waist to Hip Ratio 0.78 %    BMI (Calculated) 35.16    Grip Strength 17 kg    Single Leg Stand 6 seconds            Goals reviewed with patient; copy given to patient.

## 2023-10-09 ENCOUNTER — Encounter (HOSPITAL_COMMUNITY): Payer: Self-pay | Admitting: *Deleted

## 2023-10-09 DIAGNOSIS — Z952 Presence of prosthetic heart valve: Secondary | ICD-10-CM

## 2023-10-09 NOTE — Progress Notes (Signed)
 Cardiac Individual Treatment Plan  Patient Details  Name: Samantha Wood MRN: [id number] Date of Birth: [birth year] Referring Provider:   Flowsheet Row CARDIAC REHAB PHASE II ORIENTATION from 07/23/2023 in [facility] CARDIAC REHABILITATION  Referring Provider Alyssa Backbone MD       Initial Encounter Date:  Flowsheet Row CARDIAC REHAB PHASE II ORIENTATION from 07/23/2023 in [facility] CARDIAC REHABILITATION  Date 07/23/23       Visit Diagnosis: S/P TAVR (transcatheter aortic valve replacement)  Patient's Home Medications on Admission:  Current Outpatient Medications:    acetaminophen  (TYLENOL ) 650 MG CR tablet, Take 1,300 mg by mouth 2 (two) times daily., Disp: , Rfl:    aspirin  EC 81 MG tablet, Take 1 tablet (81 mg total) by mouth daily. Swallow whole., Disp: 90 tablet, Rfl: 1   azithromycin  (ZITHROMAX ) 500 MG tablet, Take 1 tablet (500 mg total) by mouth as directed. Take one tablet 1 hour before any dental work including cleanings., Disp: 6 tablet, Rfl: 12   calcitRIOL (ROCALTROL) 0.25 MCG capsule, Take 0.25 mcg by mouth 2 (two) times daily. , Disp: , Rfl:    clopidogrel  (PLAVIX ) 75 MG tablet, Take 1 tablet (75 mg total) by mouth daily., Disp: 90 tablet, Rfl: 6   Cranberry-Vitamin C-Probiotic (AZO CRANBERRY PO), Take 1 tablet by mouth 2 (two) times daily., Disp: , Rfl:    diphenhydrAMINE  (BENADRYL ) 25 MG tablet, Take 50 mg by mouth at bedtime., Disp: , Rfl:    estradiol  (ESTRACE ) 1 MG tablet, Take 1 mg by mouth daily. , Disp: , Rfl:    ezetimibe  (ZETIA ) 10 MG tablet, Take 1 tablet (10 mg total) by mouth daily., Disp: 90 tablet, Rfl: 3   furosemide  (LASIX ) 20 MG tablet, Take 20 mg by mouth daily as needed for fluid., Disp: , Rfl:    levothyroxine  (SYNTHROID ) 137 MCG tablet, Take 137 mcg by mouth daily before breakfast., Disp: , Rfl:    lisinopril -hydrochlorothiazide (PRINZIDE,ZESTORETIC) 20-12.5 MG per tablet, Take 1 tablet by mouth daily. , Disp: , Rfl:    LORazepam  (ATIVAN ) 0.5 MG  tablet, Take 0.125 mg by mouth every 8 (eight) hours as needed for anxiety., Disp: , Rfl:    Multiple Minerals-Vitamins (CALCIUM-MAGNESIUM -ZINC-D3 PO), Take 2 tablets by mouth 2 (two) times daily., Disp: , Rfl:    nitroGLYCERIN  (NITROSTAT ) 0.4 MG SL tablet, Place 1 tablet (0.4 mg total) under the tongue every 5 (five) minutes as needed for chest pain., Disp: 25 tablet, Rfl: 2   trolamine salicylate (BLUE-EMU HEMP) 10 % cream, Apply 1 Application topically at bedtime., Disp: , Rfl:   Past Medical History: Past Medical History:  Diagnosis Date   (HFpEF) heart failure with preserved ejection fraction (HCC)    Aortic stenosis, moderate    Arthritis    CAD (coronary artery disease) 05/17/2023   s/p PCI/DES to [facility]   CKD stage 3b, GFR 30-44 ml/min (HCC)    Follicular thyroid  carcinoma (HCC)    History of chicken pox    Hypertension    Hypocalcemia    Obesity    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 Lorie Rook and Dr. Sherene Dilling   Sarcoidosis    Vitiligo     Tobacco Use: Social History   Tobacco Use  Smoking Status Never  Smokeless Tobacco Never    Labs: Review Flowsheet       Latest Ref Rng &  Units 05/17/2023 07/09/2023 09/04/2023  Labs for ITP Cardiac and Pulmonary Rehab  Cholestrol 100 - 199 mg/dL - - 409   LDL (calc) 0 - 99 mg/dL - - 811   HDL-C >91 mg/dL - - 55   Trlycerides 0 - 149 mg/dL - - 478   PH, Arterial 7.35 - 7.45 7.380  - -  PCO2 arterial 32 - 48 mmHg 43.3  - -  Bicarbonate 20.0 - 28.0 mmol/L 25.0  25.0  25.6  - -  TCO2 22 - 32 mmol/L 26  26  27  27   -  Acid-base deficit 0.0 - 2.0 mmol/L 1.0  1.0  - -  O2 Saturation % 69  75  89  - -    Details       Multiple values from one day are sorted in reverse-chronological order         Capillary Blood Glucose: No results found for: "GLUCAP"   Exercise Target Goals: Exercise Program  Goal: Individual exercise prescription set using results from initial 6 min walk test and THRR while considering  patient's activity barriers and safety.   Exercise Prescription Goal: Starting with aerobic activity 30 plus minutes a day, 3 days per week for initial exercise prescription. Provide home exercise prescription and guidelines that participant acknowledges understanding prior to discharge.  Activity Barriers & Risk Stratification:  Activity Barriers & Cardiac Risk Stratification - 07/23/23 0843       Activity Barriers & Cardiac Risk Stratification   Activity Barriers Arthritis;Joint Problems;Left Knee Replacement;Deconditioning;Muscular Weakness;Other (comment);Back Problems;Balance Concerns    Comments arthrisits in R hip and wrist, TKR L knee 2006, occassional back pain by hip    Cardiac Risk Stratification Moderate             6 Minute Walk:  6 Minute Walk     Row Name 07/23/23 0951 10/08/23 1510       6 Minute Walk   Phase Initial Discharge    Distance 1238 feet 1330 feet    Distance Feet Change -- 92 ft    Walk Time 6 minutes 6 minutes    # of Rest Breaks 0 0    MPH 2.34 2.52    METS 2.53 2.19    RPE 15 12    Perceived Dyspnea  1 1    VO2 Peak 8.85 7.67    Symptoms Yes (comment) No    Comments slightly winded, fatgiued at end --    Resting HR 64 bpm 67 bpm    Resting BP 126/70 118/60    Resting Oxygen  Saturation  97 % 97 %    Exercise Oxygen  Saturation  during 6 min walk 96 % 97 %    Max Ex. HR 119 bpm 110 bpm    Max Ex. BP 178/64 130/70    2 Minute Post BP 128/70 120/60             Oxygen  Initial Assessment:   Oxygen  Re-Evaluation:   Oxygen  Discharge (Final Oxygen  Re-Evaluation):   Initial Exercise Prescription:  Initial Exercise Prescription - 07/23/23 0900       Date of Initial Exercise RX and Referring Provider   Date 07/23/23    Referring Provider Alyssa Backbone MD      Oxygen    Maintain Oxygen  Saturation 88% or higher       Treadmill   MPH 1.5    Grade 0.5    Minutes 15    METs 2.25  NuStep   Level 1    SPM 80    Minutes 15    METs 2.5      Prescription Details   Frequency (times per week) 2    Duration Progress to 30 minutes of continuous aerobic without signs/symptoms of physical distress      Intensity   THRR 40-80% of Max Heartrate 96-127    Ratings of Perceived Exertion 11-13    Perceived Dyspnea 0-4      Progression   Progression Continue to progress workloads to maintain intensity without signs/symptoms of physical distress.      Resistance Training   Training Prescription Yes    Weight 3 lb    Reps 10-15             Perform Capillary Blood Glucose checks as needed.  Exercise Prescription Changes:   Exercise Prescription Changes     Row Name 07/23/23 0900 07/25/23 1200 08/13/23 1500 08/27/23 1500 09/05/23 1500     Response to Exercise   Blood Pressure (Admit) 126/70 138/74 158/68 122/60 126/60   Blood Pressure (Exercise) 178/64 142/70 148/62 -- --   Blood Pressure (Exit) 128/70 120/68 142/62 122/62 90/50   Heart Rate (Admit) 64 bpm 86 bpm 60 bpm 71 bpm 73 bpm   Heart Rate (Exercise) 119 bpm 108 bpm 99 bpm 124 bpm 90 bpm   Heart Rate (Exit) 68 bpm 76 bpm 69 bpm 68 bpm 64 bpm   Oxygen  Saturation (Admit) 97 % -- -- -- --   Oxygen  Saturation (Exercise) 96 % -- -- -- --   Rating of Perceived Exertion (Exercise) 15 14 13 13 14    Perceived Dyspnea (Exercise) 1 -- -- -- --   Symptoms slightly SOB, fatigued at end -- -- -- --   Comments walk test results -- -- -- --   Duration -- Continue with 30 min of aerobic exercise without signs/symptoms of physical distress. Continue with 30 min of aerobic exercise without signs/symptoms of physical distress. Continue with 30 min of aerobic exercise without signs/symptoms of physical distress. Continue with 30 min of aerobic exercise without signs/symptoms of physical distress.   Intensity -- THRR unchanged THRR unchanged THRR unchanged  THRR unchanged     Progression   Progression -- Continue to progress workloads to maintain intensity without signs/symptoms of physical distress. Continue to progress workloads to maintain intensity without signs/symptoms of physical distress. Continue to progress workloads to maintain intensity without signs/symptoms of physical distress. Continue to progress workloads to maintain intensity without signs/symptoms of physical distress.     Resistance Training   Training Prescription -- Yes Yes Yes Yes   Weight -- 3 3 3 3    Reps -- 10-15 10-15 10-15 10-15     Treadmill   MPH -- 0.7 -- -- --   Grade -- 0 -- -- --   Minutes -- 15 -- -- --   METs -- 1.57 -- -- --     NuStep   Level -- 1 1 3 3    SPM -- 55 62 62 50   Minutes -- 10 15 15 15    METs -- 1.8 1.9 2 2.1     Track   Laps -- -- 22 30 20    Minutes -- -- 15 15 15    METs -- -- 1.9 2.3 1.87            Exercise Comments:   Exercise Comments     Row Name 07/25/23 1336  Exercise Comments First full day of exercise!  Patient was oriented to gym and equipment including functions, settings, policies, and procedures.  Patient's individual exercise prescription and treatment plan were reviewed.  All starting workloads were established based on the results of the 6 minute walk test done at initial orientation visit.  The plan for exercise progression was also introduced and progression will be customized based on patient's performance and goals.                Exercise Goals and Review:   Exercise Goals     Row Name 07/23/23 0953             Exercise Goals   Increase Physical Activity Yes       Intervention Provide advice, education, support and counseling about physical activity/exercise needs.;Develop an individualized exercise prescription for aerobic and resistive training based on initial evaluation findings, risk stratification, comorbidities and participant's personal goals.       Expected Outcomes  Short Term: Attend rehab on a regular basis to increase amount of physical activity.;Long Term: Add in home exercise to make exercise part of routine and to increase amount of physical activity.;Long Term: Exercising regularly at least 3-5 days a week.       Increase Strength and Stamina Yes       Intervention Provide advice, education, support and counseling about physical activity/exercise needs.;Develop an individualized exercise prescription for aerobic and resistive training based on initial evaluation findings, risk stratification, comorbidities and participant's personal goals.       Expected Outcomes Short Term: Increase workloads from initial exercise prescription for resistance, speed, and METs.;Short Term: Perform resistance training exercises routinely during rehab and add in resistance training at home;Long Term: Improve cardiorespiratory fitness, muscular endurance and strength as measured by increased METs and functional capacity ( )       Able to understand and use rate of perceived exertion (RPE) scale Yes       Intervention Provide education and explanation on how to use RPE scale       Expected Outcomes Short Term: Able to use RPE daily in rehab to express subjective intensity level;Long Term:  Able to use RPE to guide intensity level when exercising independently       Able to understand and use Dyspnea scale Yes       Intervention Provide education and explanation on how to use Dyspnea scale       Expected Outcomes Short Term: Able to use Dyspnea scale daily in rehab to express subjective sense of shortness of breath during exertion;Long Term: Able to use Dyspnea scale to guide intensity level when exercising independently       Knowledge and understanding of Target Heart Rate Range (THRR) Yes       Intervention Provide education and explanation of THRR including how the numbers were predicted and where they are located for reference       Expected Outcomes Short Term: Able to  state/look up THRR;Long Term: Able to use THRR to govern intensity when exercising independently;Short Term: Able to use daily as guideline for intensity in rehab       Able to check pulse independently Yes       Intervention Provide education and demonstration on how to check pulse in carotid and radial arteries.;Review the importance of being able to check your own pulse for safety during independent exercise       Expected Outcomes Short Term: Able to explain why pulse checking is  important during independent exercise;Long Term: Able to check pulse independently and accurately       Understanding of Exercise Prescription Yes       Intervention Provide education, explanation, and written materials on patient's individual exercise prescription       Expected Outcomes Short Term: Able to explain program exercise prescription;Long Term: Able to explain home exercise prescription to exercise independently                Exercise Goals Re-Evaluation :  Exercise Goals Re-Evaluation     Row Name 07/25/23 1336 07/29/23 1231 09/10/23 1401 10/04/23 1122       Exercise Goal Re-Evaluation   Exercise Goals Review Able to understand and use rate of perceived exertion (RPE) scale;Knowledge and understanding of Target Heart Rate Range (THRR);Able to understand and use Dyspnea scale;Understanding of Exercise Prescription Increase Physical Activity;Increase Strength and Stamina;Understanding of Exercise Prescription Increase Physical Activity;Increase Strength and Stamina;Understanding of Exercise Prescription Increase Physical Activity;Increase Strength and Stamina;Understanding of Exercise Prescription    Comments Reviewed RPE and dyspnea scale, THR and program prescription with pt today.  Pt voiced understanding and was given a copy of goals to take home. Marily Shows just started the rehab program and had a good fist sesson. She is getting use to using the treadmill and nustep. She is exercising at an RPE of 14.  Will continue to montior and progress as able, Marily Shows is doing well in rehab. She feels like she has gotten her strength back but still working on stamina.  She also says it is much better than it was previously.  On her off days she is doing more house work and helping her husband with fence and building maintenance.  They still go dancing twice a week on Tuesday nights and every other Saturday.  It is her favorite activity and they look forward to it every week.  She has been able to get out in her garden and do the weed eating as well. Marily Shows continues to do well in rehab. She stated taht she has noticed an increase in her strength since coming to rehab. She is able to play with her great-grandkids more and goes out dancing every Tuesday night. She does not have to take as many breakes that she use too.    Expected Outcomes Short: Use RPE daily to regulate intensity.  Long: Follow program prescription in THR. Continue to attend rehab Short: Continue to exercise more on her off days from rehab.  Long; Continue to improve stamina Short: Continue to exercise more on her off days from rehab.  Long; Continue to improve stamina              Discharge Exercise Prescription (Final Exercise Prescription Changes):  Exercise Prescription Changes - 09/05/23 1500       Response to Exercise   Blood Pressure (Admit) 126/60    Blood Pressure (Exit) 90/50    Heart Rate (Admit) 73 bpm    Heart Rate (Exercise) 90 bpm    Heart Rate (Exit) 64 bpm    Rating of Perceived Exertion (Exercise) 14    Duration Continue with 30 min of aerobic exercise without signs/symptoms of physical distress.    Intensity THRR unchanged      Progression   Progression Continue to progress workloads to maintain intensity without signs/symptoms of physical distress.      Resistance Training   Training Prescription Yes    Weight 3    Reps 10-15  NuStep   Level 3    SPM 50    Minutes 15    METs 2.1      Track   Laps 20     Minutes 15    METs 1.87             Nutrition:  Target Goals: Understanding of nutrition guidelines, daily intake of sodium 1500mg , cholesterol 200mg , calories 30% from fat and 7% or less from saturated fats, daily to have 5 or more servings of fruits and vegetables.  Biometrics:  Pre Biometrics - 07/23/23 0953       Pre Biometrics   Height 5\' 3"  (1.6 m)    Weight 200 lb 3.2 oz (90.8 kg)    Waist Circumference 35 inches    Hip Circumference 46.5 inches    Waist to Hip Ratio 0.75 %    BMI (Calculated) 35.47    Grip Strength 17.1 kg    Single Leg Stand 4.8 seconds             Post Biometrics - 10/08/23 1513        Post  Biometrics   Height 5\' 3"  (1.6 m)    Weight 198 lb 6.6 oz (90 kg)    Waist Circumference 35 inches    Hip Circumference 45 inches    Waist to Hip Ratio 0.78 %    BMI (Calculated) 35.16    Grip Strength 17 kg    Single Leg Stand 6 seconds             Nutrition Therapy Plan and Nutrition Goals:  Nutrition Therapy & Goals - 07/23/23 0959       Intervention Plan   Intervention Prescribe, educate and counsel regarding individualized specific dietary modifications aiming towards targeted core components such as weight, hypertension, lipid management, diabetes, heart failure and other comorbidities.;Nutrition handout(s) given to patient.    Expected Outcomes Short Term Goal: Understand basic principles of dietary content, such as calories, fat, sodium, cholesterol and nutrients.;Long Term Goal: Adherence to prescribed nutrition plan.             Nutrition Assessments:  MEDIFICTS Score Key: >=70 Need to make dietary changes  40-70 Heart Healthy Diet <= 40 Therapeutic Level Cholesterol Diet  Flowsheet Row CARDIAC REHAB PHASE II ORIENTATION from 07/23/2023 in [facility] CARDIAC REHABILITATION  Picture Your Plate Total Score on Admission 59      Picture Your Plate Scores: <96 Unhealthy dietary pattern with much room for  improvement. 41-50 Dietary pattern unlikely to meet recommendations for good health and room for improvement. 51-60 More healthful dietary pattern, with some room for improvement.  >60 Healthy dietary pattern, although there may be some specific behaviors that could be improved.    Nutrition Goals Re-Evaluation:  Nutrition Goals Re-Evaluation     Row Name 09/10/23 1418 10/04/23 1127           Goals   Nutrition Goal Heart Healthy Eating Heart Healthy eating      Comment Marily Shows is doing well in rehab.  She has been working on her diet and has cut back on her bacon.  She is proud of herself for this.  She is also realizing that if she has a craving then she can have a little bit to satisfy her craving and then let it go. She is living by the everything in moderation.  She is watch her sodium more and trying to eat better overall. Marily Shows continues to do well in rehab.  She likes to eat lots of veggies for meals and will includs beans as well. She has been trying to change her diet to help with her Cholesterol.      Expected Outcome Short; COntinue to use moderation Long: continue to follow heart healthy guidelines Short; COntinue to use moderation Long: continue to follow heart healthy guidelines               Nutrition Goals Discharge (Final Nutrition Goals Re-Evaluation):  Nutrition Goals Re-Evaluation - 10/04/23 1127       Goals   Nutrition Goal Heart Healthy eating    Comment Marily Shows continues to do well in rehab. She likes to eat lots of veggies for meals and will includs beans as well. She has been trying to change her diet to help with her Cholesterol.    Expected Outcome Short; COntinue to use moderation Long: continue to follow heart healthy guidelines             Psychosocial: Target Goals: Acknowledge presence or absence of significant depression and/or stress, maximize coping skills, provide positive support system. Participant is able to verbalize types and ability to use  techniques and skills needed for reducing stress and depression.  Initial Review & Psychosocial Screening:  Initial Psych Review & Screening - 07/23/23 0846       Initial Review   Current issues with Current Psychotropic Meds;Current Stress Concerns    Source of Stress Concerns Chronic Illness;Unable to participate in former interests or hobbies;Unable to perform yard/household activities    Comments has lorapam for anixety uses in small doses as needed      Family Dynamics   Good Support System? Yes   Lovely Rubins ([place]), daughter in law Mariah Shines (leans on her too)   Concerns Recent loss of child    Comments Only son passed last February      Barriers   Psychosocial barriers to participate in program The patient should benefit from training in stress management and relaxation.;Psychosocial barriers identified (see note)      Screening Interventions   Interventions Encouraged to exercise;Provide feedback about the scores to participant;To provide support and resources with identified psychosocial needs    Expected Outcomes Short Term goal: Utilizing psychosocial counselor, staff and physician to assist with identification of specific Stressors or current issues interfering with healing process. Setting desired goal for each stressor or current issue identified.;Long Term Goal: Stressors or current issues are controlled or eliminated.;Short Term goal: Identification and review with participant of any Quality of Life or Depression concerns found by scoring the questionnaire.;Long Term goal: The participant improves quality of Life and PHQ9 Scores as seen by post scores and/or verbalization of changes             Quality of Life Scores:  Quality of Life - 07/23/23 0958       Quality of Life   Select Quality of Life      Quality of Life Scores   Health/Function Pre 29.6 %    Socioeconomic Pre 30 %    Psych/Spiritual Pre 30 %    Family Pre 30 %    GLOBAL Pre 29.83 %             Scores of 19 and below usually indicate a poorer quality of life in these areas.  A difference of  2-3 points is a clinically meaningful difference.  A difference of 2-3 points in the total score of the Quality of Life Index has been associated  with significant improvement in overall quality of life, self-image, physical symptoms, and general health in studies assessing change in quality of life.  PHQ-9: Review Flowsheet       07/23/2023  Depression screen PHQ 2/9  Decreased Interest 0  Down, Depressed, Hopeless 0  PHQ - 2 Score 0  Altered sleeping 0  Tired, decreased energy 0  Change in appetite 0  Feeling bad or failure about yourself  0  Trouble concentrating 0  Moving slowly or fidgety/restless 0  Suicidal thoughts 0  PHQ-9 Score 0  Difficult doing work/chores Not difficult at all   Interpretation of Total Score  Total Score Depression Severity:  1-4 = Minimal depression, 5-9 = Mild depression, 10-14 = Moderate depression, 15-19 = Moderately severe depression, 20-27 = Severe depression   Psychosocial Evaluation and Intervention:  Psychosocial Evaluation - 07/23/23 0954       Psychosocial Evaluation & Interventions   Interventions Stress management education;Relaxation education;Encouraged to exercise with the program and follow exercise prescription    Comments Marily Shows is coming into cardiac rehab after a TAVR.  She also had a stent placed back in December and was unable to start rehab due to TAVR workup. She is eager to get going and has begun to regain her strength already. She wants to get all her strength back and to feel better overall. She lives with her friend Samuel Crock and relies heavily on him.  She does have a history of anxiety but tires to only take a 1/4 tab of lorazapam at a time.  She usually sleeps well but on occassion will wake at night and stress over the loss of her son again and then she will take a full dose to get back to sleep.  She lost her son a year ago in  February.  It was unexpected and still hard on both her and her daughter in law.  They lean on each other for support.  She and Samuel Crock go dancing every week and weekend at the [facility] for exercise and to get out and about.  She is eager to get dancing without stopping again.  She also enjoys wood turning and painting in her down time.  She has no barriers to attending rehab and plans to come twice a week.    Expected Outcomes Short; Attend rehab regularly to build up stamina Long: Able to continue to go dancing every week    Continue Psychosocial Services  Follow up required by staff             Psychosocial Re-Evaluation:  Psychosocial Re-Evaluation     Row Name 09/10/23 1404 10/04/23 1123           Psychosocial Re-Evaluation   Current issues with Current Sleep Concerns;Current Stress Concerns;Current Anxiety/Panic;Current Psychotropic Meds Current Sleep Concerns;Current Stress Concerns;Current Anxiety/Panic;Current Psychotropic Meds      Comments Marily Shows is doing well in rehab.  She is feeling better overall and feels like she has gotten to a good place mentally.  She admits that there are some nights that she wakes thinking about her son and not able to get back to sleep. She is still dancing each week and looks forward to it each week.  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  She enjoys doing word puzzles and feels like her brain is now more alert than it was before. Marily Shows continues to do well in rehab. She is  feeling better and has been enjoying getting back to a healthy lifestyle and going/doing activities she enjoys. She does not have much stres other then some family issues. She continues to go out dancing every Tuesday and is a great stress reliver for her that she looks foward too. Her sleep is doing good, she does wake up some due to pain from her hips. She is overall improving and enjoying getting out to do things       Expected Outcomes Short: Continue to dance for her joy Long: Continue to exercise for mental boost Short: Continue to go dancing for enjoyment and stress relif  long: continue to exercise for mental boost      Interventions Encouraged to attend Cardiac Rehabilitation for the exercise;Stress management education Encouraged to attend Cardiac Rehabilitation for the exercise      Continue Psychosocial Services  Follow up required by staff Follow up required by staff               Psychosocial Discharge (Final Psychosocial Re-Evaluation):  Psychosocial Re-Evaluation - 10/04/23 1123       Psychosocial Re-Evaluation   Current issues with Current Sleep Concerns;Current Stress Concerns;Current Anxiety/Panic;Current Psychotropic Meds    Comments Marily Shows continues to do well in rehab. She is feeling better and has been enjoying getting back to a healthy lifestyle and going/doing activities she enjoys. She does not have much stres other then some family issues. She continues to go out dancing every Tuesday and is a great stress reliver for her that she looks foward too. Her sleep is doing good, she does wake up some due to pain from her hips. She is overall improving and enjoying getting out to do things    Expected Outcomes Short: Continue to go dancing for enjoyment and stress relif  long: continue to exercise for mental boost    Interventions Encouraged to attend Cardiac Rehabilitation for the exercise    Continue Psychosocial Services  Follow up required by staff             Vocational Rehabilitation: Provide vocational rehab assistance to qualifying candidates.   Vocational Rehab Evaluation & Intervention:  Vocational Rehab - 07/23/23 0848       Initial Vocational Rehab Evaluation & Intervention   Assessment shows need for Vocational Rehabilitation No   retired            Education: Education Goals: Education classes will be provided on a weekly basis, covering required topics.  Participant will state understanding/return demonstration of topics presented.  Learning Barriers/Preferences:  Learning Barriers/Preferences - 07/23/23 0844       Learning Barriers/Preferences   Learning Barriers Sight   glasses for reading   Learning Preferences None             Education Topics: Hypertension, Hypertension Reduction -Define heart disease and high blood pressure. Discus how high blood pressure affects the body and ways to reduce high blood pressure.   Exercise and Your Heart -Discuss why it is important to exercise, the FITT principles of exercise, normal and abnormal responses to exercise, and how to exercise safely. Flowsheet Row CARDIAC REHAB PHASE II EXERCISE from 10/03/2023 in [place] [facility]  Date 09/05/23  [09/18/23 Resistance, Flexibility, Balance ([facility])]  Educator hb  Instruction Review Code 1- Verbalizes Understanding       Angina -Discuss definition of angina, causes of angina, treatment of angina, and how to decrease risk of having angina. Flowsheet Row CARDIAC REHAB PHASE II EXERCISE from  10/03/2023 in [facility]  Date 08/29/23  [tests/procedures]  Educator HB  Instruction Review Code 1- Verbalizes Understanding       Cardiac Medications -Review what the following cardiac medications are used for, how they affect the body, and side effects that may occur when taking the medications.  Medications include Aspirin , Beta blockers, calcium channel blockers, ACE Inhibitors, angiotensin receptor blockers, diuretics, digoxin, and antihyperlipidemics. Flowsheet Row CARDIAC REHAB PHASE II EXERCISE from 10/03/2023 in [place] CARDIAC [facility]  Date 08/15/23  Educator hb  Instruction Review Code 1- Verbalizes Understanding       Congestive Heart Failure -Discuss the definition of CHF, how to live with CHF, the signs and symptoms of CHF, and how keep track of weight and sodium intake. Flowsheet Row  CARDIAC REHAB PHASE II EXERCISE from 10/03/2023 in [place] C[facility]  Date 08/08/23  Educator [facility]  Instruction Review Code 1- Verbalizes Understanding       Heart Disease and Intimacy -Discus the effect sexual activity has on the heart, how changes occur during intimacy as we age, and safety during sexual activity. Flowsheet Row CARDIAC REHAB PHASE II EXERCISE from 10/03/2023 in [place] [facility]  Date 09/12/23  Educator jh  Instruction Review Code 1- Verbalizes Understanding       Smoking Cessation / COPD -Discuss different methods to quit smoking, the health benefits of quitting smoking, and the definition of COPD.   Nutrition I: Fats -Discuss the types of cholesterol, what cholesterol does to the heart, and how cholesterol levels can be controlled. Flowsheet Row CARDIAC REHAB PHASE II EXERCISE from 10/03/2023 in [place] CARDIAC RE[facility]ABIL[facility]  Date 07/25/23  Educator [facility]  Instruction Review Code 1- Verbalizes Understanding       Nutrition II: Labels -Discuss the different components of food labels and how to read food label Flowsheet Row CARDIAC REHAB PHASE II EXERCISE from 10/03/2023 in [place] CARDIAC REHABILITATION  Date 09/25/23  Educator [facility]  Instruction Review Code 1- Verbalizes Understanding       Heart Parts/Heart Disease and PAD -Discuss the anatomy of the heart, the pathway of blood circulation through the heart, and these are affected by heart disease.   Stress I: Signs and Symptoms -Discuss the causes of stress, how stress may lead to anxiety and depression, and ways to limit stress. Flowsheet Row CARDIAC REHAB PHASE II EXERCISE from 10/03/2023 in [place] CARDIAC RE[facility]ABIL[facility]  Date 08/01/23  Educator DJ  Instruction Review Code 1- Verbalizes Understanding       Stress II: Relaxation -Discuss different types of relaxation techniques to limit stress. Flowsheet Row CARDIAC REHAB PHASE II EXERCISE from 10/03/2023  in [place] CARDIAC REHAB[facility]  Date 10/03/23  Educator [facility]  Instruction Review Code 1- Verbalizes Understanding       Warning Signs of Stroke / TIA -Discuss definition of a stroke, what the signs and symptoms are of a stroke, and how to identify when someone is having stroke.   Knowledge Questionnaire Score:  Knowledge Questionnaire Score - 07/23/23 0959       Knowledge Questionnaire Score   Pre Score 22/26             Core Components/Risk Factors/Patient Goals at Admission:  Personal Goals and Risk Factors at Admission - 07/23/23 0959       Core Components/Risk Factors/Patient Goals on Admission    Weight Management Yes;Obesity;Weight Loss    Intervention Weight Management: Develop a combined nutrition and exercise program designed to reach  desired caloric intake, while maintaining appropriate intake of nutrient and fiber, sodium and fats, and appropriate energy expenditure required for the weight goal.;Weight Management: Provide education and appropriate resources to help participant work on and attain dietary goals.;Weight Management/Obesity: Establish reasonable short term and long term weight goals.;Obesity: Provide education and appropriate resources to help participant work on and attain dietary goals.    Admit Weight 200 lb 3.2 oz (90.8 kg)    Goal Weight: Short Term 195 lb (88.5 kg)    Goal Weight: Long Term 190 lb (86.2 kg)    Expected Outcomes Short Term: Continue to assess and modify interventions until short term weight is achieved;Long Term: Adherence to nutrition and physical activity/exercise program aimed toward attainment of established weight goal;Weight Loss: Understanding of general recommendations for a balanced deficit meal plan, which promotes 1-2 lb weight loss per week and includes a negative energy balance of [phone] kcal/d;Understanding recommendations for meals to include 15-35% energy as protein, 25-35% energy from fat, 35-60% energy from  carbohydrates, less than 200mg  of dietary cholesterol, 20-35 gm of total fiber daily;Understanding of distribution of calorie intake throughout the day with the consumption of 4-5 meals/snacks    Hypertension Yes    Intervention Provide education on lifestyle modifcations including regular physical activity/exercise, weight management, moderate sodium restriction and increased consumption of fresh fruit, vegetables, and low fat dairy, alcohol  moderation, and smoking cessation.;Monitor prescription use compliance.    Expected Outcomes Short Term: Continued assessment and intervention until BP is < 140/43mm HG in hypertensive participants. < 130/70mm HG in hypertensive participants with diabetes, heart failure or chronic kidney disease.;Long Term: Maintenance of blood pressure at goal levels.    Lipids Yes    Intervention Provide education and support for participant on nutrition & aerobic/resistive exercise along with prescribed medications to achieve LDL 70mg , HDL >40mg .    Expected Outcomes Short Term: Participant states understanding of desired cholesterol values and is compliant with medications prescribed. Participant is following exercise prescription and nutrition guidelines.;Long Term: Cholesterol controlled with medications as prescribed, with individualized exercise RX and with personalized nutrition plan. Value goals: LDL < 70mg , HDL > 40 mg.             Core Components/Risk Factors/Patient Goals Review:   Goals and Risk Factor Review     Row Name 09/10/23 1420 10/04/23 1132           Core Components/Risk Factors/Patient Goals Review   Personal Goals Review Weight Management/Obesity;Hypertension;Lipids Weight Management/Obesity;Hypertension;Lipids      Review Marily Shows is doing well in rehab.  She is maintianing her weight and pleased with how she is doing.  Her pressures have been good for most part. She has had a couple of low readings but only one bout of dizziness with it that  passed quickly.  She is trying to drink enough. She also mentioned that if her weight creeps up she will take and extra fluid pill. Marily Shows is doing well in rehab. She is maintiaing her weight and continues to be happy. She is watching what she eats and fluids due to gaining fluid somedays. She weighs herslef to watch her fluid gain. She checks her blood pressure every other day.      Expected Outcomes Short: Conitnue to watch bp and weight closely Long; continue to montior risk factors Short: Conitnue to watch bp and weight closely for fluid  Long; continue to montior risk factors  Core Components/Risk Factors/Patient Goals at Discharge (Final Review):   Goals and Risk Factor Review - 10/04/23 1132       Core Components/Risk Factors/Patient Goals Review   Personal Goals Review Weight Management/Obesity;Hypertension;Lipids    Review Marily Shows is doing well in rehab. She is maintiaing her weight and continues to be happy. She is watching what she eats and fluids due to gaining fluid somedays. She weighs herslef to watch her fluid gain. She checks her blood pressure every other day.    Expected Outcomes Short: Conitnue to watch bp and weight closely for fluid  Long; continue to montior risk factors             ITP Comments:  ITP Comments     Row Name 07/23/23 0951 07/25/23 1336 08/14/23 1159 09/11/23 1504 10/09/23 1142   ITP Comments Patient attend orientation today.  Patient is attending Cardiac Rehabilitation Program.  Documentation for diagnosis can be found in [facility] 07/10/23.  Reviewed medical chart, RPE/RPD, gym safety and program guidelines.  Patient was fitted to equipment they will be using during rehab.  Patient is scheduled to start exercise on Thursday 07/25/23 at 1330.   Initial ITP created and sent for review and signature by Dr. Armida Lander, Medical Director for Cardiac Rehabilitation Program. First full day of exercise!  Patient was oriented to gym and equipment including  functions, settings, policies, and procedures.  Patient's individual exercise prescription and treatment plan were reviewed.  All starting workloads were established based on the results of the 6 minute walk test done at initial orientation visit.  The plan for exercise progression was also introduced and progression will be customized based on patient's performance and goals. 30 day review completed. ITP sent to Dr. Armida Lander, Medical Director of Cardiac Rehab. Continue with ITP unless changes are made by physician.  Newer to program 30 day review completed. ITP sent to Dr. Armida Lander, Medical Director of Cardiac Rehab. Continue with ITP unless changes are made by physician. 30 day review completed. ITP sent to Dr. Armida Lander, Medical Director of Cardiac Rehab. Continue with ITP unless changes are made by physician.            Comments: 30 day review

## 2023-10-10 ENCOUNTER — Encounter (HOSPITAL_COMMUNITY)
Admission: RE | Admit: 2023-10-10 | Discharge: 2023-10-10 | Disposition: A | Discharge status: Home / Self Care | Source: Ambulatory Visit | Attending: Internal Medicine | Admitting: Internal Medicine

## 2023-10-10 DIAGNOSIS — Z952 Presence of prosthetic heart valve: Secondary | ICD-10-CM

## 2023-10-10 NOTE — Progress Notes (Signed)
 Daily Session Note  Patient Details  Name: Samantha Wood MRN: [id number] Date of Birth: January 05, 1946 Referring Provider:   Flowsheet Row CARDIAC REHAB PHASE II ORIENTATION from 07/23/2023 in [facility] CARDIAC REHABILIT[facility]  Referring Provider Alyssa Backbone MD       Encounter Date: 10/10/2023  Check In:  Session Check In - 10/10/23 1315       Check-In   Supervising physician immediately available to respond to emergencies See telemetry face sheet for immediately available MD    Location [facility]    Staff Present Clotilda Danish, BS, Exercise Physiologist;Jessica Montezuma, MA, RCEP, CCRP, Amador Bad, RN, Engineer, mining, RN    Virtual Visit No    Medication changes reported     No    Fall or balance concerns reported    No    Warm-up and Cool-down Performed on first and last piece of Public house manager Performed Yes    VAD Patient? No    PAD/SET Patient? No      Pain Assessment   Currently in Pain? No/denies    Pain Score 0-No pain    Multiple Pain Sites No             Capillary Blood Glucose: No results found for this or any previous visit (from the past 24 hours).    Social History   Tobacco Use  Smoking Status Never  Smokeless Tobacco Never    Goals Met:  Independence with exercise equipment Exercise tolerated well No report of concerns or symptoms today Strength training completed today  Goals Unmet:  Not Applicable  Comments: Pt able to follow exercise prescription today without complaint.  Will continue to monitor for progression.

## 2023-10-15 ENCOUNTER — Encounter (HOSPITAL_COMMUNITY)
Admission: RE | Admit: 2023-10-15 | Discharge: 2023-10-15 | Disposition: A | Discharge status: Home / Self Care | Source: Ambulatory Visit | Attending: Internal Medicine | Admitting: Internal Medicine

## 2023-10-15 DIAGNOSIS — Z952 Presence of prosthetic heart valve: Secondary | ICD-10-CM

## 2023-10-15 NOTE — Progress Notes (Signed)
 Discharge Progress Report  Patient Details  Name: Samantha Wood MRN: [id number] Date of Birth: November 16, 1945 Referring Provider:   Flowsheet Row CARDIAC REHAB PHASE II ORIENTATION from 07/23/2023 in [facility] REH[facility]  Referring Provider Alyssa Backbone MD        Number of Visits: 36  Reason for Discharge:  Patient reached a stable level of exercise. Patient independent in their exercise. Patient has met program and personal goals.  Smoking History:  Social History   Tobacco Use  Smoking Status Never  Smokeless Tobacco Never    Diagnosis:  S/P TAVR (transcatheter aortic valve replacement)    Initial Exercise Prescription:  Initial Exercise Prescription - 07/23/23 0900       Date of Initial Exercise RX and Referring Provider   Date 07/23/23    Referring Provider Alyssa Backbone MD      Oxygen    Maintain Oxygen  Saturation 88% or higher      Treadmill   MPH 1.5    Grade 0.5    Minutes 15    METs 2.25      NuStep   Level 1    SPM 80    Minutes 15    METs 2.5      Prescription Details   Frequency (times per week) 2    Duration Progress to 30 minutes of continuous aerobic without signs/symptoms of physical distress      Intensity   THRR 40-80% of Max Heartrate 96-127    Ratings of Perceived Exertion 11-13    Perceived Dyspnea 0-4      Progression   Progression Continue to progress workloads to maintain intensity without signs/symptoms of physical distress.      Resistance Training   Training Prescription Yes    Weight 3 lb    Reps 10-15             Discharge Exercise Prescription (Final Exercise Prescription Changes):  Exercise Prescription Changes - 10/08/23 1300       Response to Exercise   Blood Pressure (Admit) 120/60    Blood Pressure (Exit) 120/68    Heart Rate (Admit) 71 bpm    Heart Rate (Exercise) 91 bpm    Heart Rate (Exit) 70 bpm    Rating of Perceived Exertion (Exercise) 14    Duration Continue with 30 min of  aerobic exercise without signs/symptoms of physical distress.    Intensity THRR unchanged      Progression   Progression Continue to progress workloads to maintain intensity without signs/symptoms of physical distress.      Resistance Training   Training Prescription Yes    Weight 3    Reps 10-15      NuStep   Level 4    SPM 69    Minutes 15    METs 2.1      Track   Laps 20    Minutes 15    METs 2.93             Functional Capacity:  6 Minute Walk     Row Name 07/23/23 0951 10/08/23 1510       6 Minute Walk   Phase Initial Discharge    Distance 1238 feet 1330 feet    Distance Feet Change -- 92 ft    Walk Time 6 minutes 6 minutes    # of Rest Breaks 0 0    MPH 2.34 2.52    METS 2.53 2.19    RPE 15 12  Perceived Dyspnea  1 1    VO2 Peak 8.85 7.67    Symptoms Yes (comment) No    Comments slightly winded, fatgiued at end --    Resting HR 64 bpm 67 bpm    Resting BP 126/70 118/60    Resting Oxygen  Saturation  97 % 97 %    Exercise Oxygen  Saturation  during 6 min walk 96 % 97 %    Max Ex. HR 119 bpm 110 bpm    Max Ex. BP 178/64 130/70    2 Minute Post BP 128/70 120/60             Psychological, QOL, Others - Outcomes: PHQ 2/9:    07/23/2023    8:43 AM  Depression screen PHQ 2/9  Decreased Interest 0  Down, Depressed, Hopeless 0  PHQ - 2 Score 0  Altered sleeping 0  Tired, decreased energy 0  Change in appetite 0  Feeling bad or failure about yourself  0  Trouble concentrating 0  Moving slowly or fidgety/restless 0  Suicidal thoughts 0  PHQ-9 Score 0  Difficult doing work/chores Not difficult at all    Quality of Life:  Quality of Life - 07/23/23 0958       Quality of Life   Select Quality of Life      Quality of Life Scores   Health/Function Pre 29.6 %    Socioeconomic Pre 30 %    Psych/Spiritual Pre 30 %    Family Pre 30 %    GLOBAL Pre 29.83 %                Nutrition & Weight - Outcomes:  Pre Biometrics - 07/23/23  0953       Pre Biometrics   Height 5\' 3"  (1.6 m)    Weight 90.8 kg    Waist Circumference 35 inches    Hip Circumference 46.5 inches    Waist to Hip Ratio 0.75 %    BMI (Calculated) 35.47    Grip Strength 17.1 kg    Single Leg Stand 4.8 seconds             Post Biometrics - 10/08/23 1513        Post  Biometrics   Height 5\' 3"  (1.6 m)    Weight 90 kg    Waist Circumference 35 inches    Hip Circumference 45 inches    Waist to Hip Ratio 0.78 %    BMI (Calculated) 35.16    Grip Strength 17 kg    Single Leg Stand 6 seconds

## 2023-10-15 NOTE — Progress Notes (Signed)
 Daily Session Note  Patient Details  Name: Samantha Wood MRN: [id number] Date of Birth: 02/28/1946 Referring Provider:   Flowsheet Row CARDIAC REHAB PHASE II ORIENTATION from 07/23/2023 in [facility] CARDIAC REHABILITATION  Referring Provider Alyssa Backbone MD       Encounter Date: 10/15/2023  Check In:  Session Check In - 10/15/23 1330       Check-In   Supervising physician immediately available to respond to emergencies See telemetry face sheet for immediately available MD    Location [facility]    Staff Present Clotilda Danish, BS, Exercise Physiologist;Tina Eilene Grater, RN;Brittany Annette Barters, BSN, RN, WTA-C    Virtual Visit No    Medication changes reported     No    Fall or balance concerns reported    No    Tobacco Cessation No Change    Warm-up and Cool-down Performed on first and last piece of equipment    Resistance Training Performed Yes    VAD Patient? No    PAD/SET Patient? No      Pain Assessment   Currently in Pain? No/denies    Pain Score 0-No pain    Multiple Pain Sites No             Capillary Blood Glucose: No results found for this or any previous visit (from the past 24 hours).    Social History   Tobacco Use  Smoking Status Never  Smokeless Tobacco Never    Goals Met:  Independence with exercise equipment Exercise tolerated well No report of concerns or symptoms today Strength training completed today  Goals Unmet:  Not Applicable  Comments:  Marielouise graduated today from  rehab with 36 sessions completed.  Details of the patient's exercise prescription and what She needs to do in order to continue the prescription and progress were discussed with patient.  Patient was given a copy of prescription and goals.  Patient verbalized understanding. Shonia plans to continue to exercise by exwercisen at [facility].

## 2023-10-15 NOTE — Progress Notes (Signed)
 Cardiac Individual Treatment Plan  Patient Details  Name: Samantha Wood MRN: [id number] Date of Birth: Nov 13, 1945 Referring Provider:   Flowsheet Row CARDIAC REHAB PHASE II ORIENTATION from 07/23/2023 in [facility] CARDIAC REHABILITATION  Referring Provider Alyssa Backbone MD       Initial Encounter Date:  Flowsheet Row CARDIAC REHAB PHASE II ORIENTATION from 07/23/2023 in [place] CARDIAC REHABILITATION  Date 07/23/23       Visit Diagnosis: S/P TAVR (transcatheter aortic valve replacement)  Patient's Home Medications on Admission:  Current Outpatient Medications:    acetaminophen  (TYLENOL ) 650 MG CR tablet, Take 1,300 mg by mouth 2 (two) times daily., Disp: , Rfl:    aspirin  EC 81 MG tablet, Take 1 tablet (81 mg total) by mouth daily. Swallow whole., Disp: 90 tablet, Rfl: 1   azithromycin  (ZITHROMAX ) 500 MG tablet, Take 1 tablet (500 mg total) by mouth as directed. Take one tablet 1 hour before any dental work including cleanings., Disp: 6 tablet, Rfl: 12   calcitRIOL (ROCALTROL) 0.25 MCG capsule, Take 0.25 mcg by mouth 2 (two) times daily. , Disp: , Rfl:    clopidogrel  (PLAVIX ) 75 MG tablet, Take 1 tablet (75 mg total) by mouth daily., Disp: 90 tablet, Rfl: 6   Cranberry-Vitamin C-Probiotic (AZO CRANBERRY PO), Take 1 tablet by mouth 2 (two) times daily., Disp: , Rfl:    diphenhydrAMINE  (BENADRYL ) 25 MG tablet, Take 50 mg by mouth at bedtime., Disp: , Rfl:    estradiol  (ESTRACE ) 1 MG tablet, Take 1 mg by mouth daily. , Disp: , Rfl:    ezetimibe  (ZETIA ) 10 MG tablet, Take 1 tablet (10 mg total) by mouth daily., Disp: 90 tablet, Rfl: 3   furosemide  (LASIX ) 20 MG tablet, Take 20 mg by mouth daily as needed for fluid., Disp: , Rfl:    levothyroxine  (SYNTHROID ) 137 MCG tablet, Take 137 mcg by mouth daily before breakfast., Disp: , Rfl:    lisinopril -hydrochlorothiazide (PRINZIDE,ZESTORETIC) 20-12.5 MG per tablet, Take 1 tablet by mouth daily. , Disp: , Rfl:    LORazepam  (ATIVAN ) 0.5 MG  tablet, Take 0.125 mg by mouth every 8 (eight) hours as needed for anxiety., Disp: , Rfl:    Multiple Minerals-Vitamins (CALCIUM-MAGNESIUM -ZINC-D3 PO), Take 2 tablets by mouth 2 (two) times daily., Disp: , Rfl:    nitroGLYCERIN  (NITROSTAT ) 0.4 MG SL tablet, Place 1 tablet (0.4 mg total) under the tongue every 5 (five) minutes as needed for chest pain., Disp: 25 tablet, Rfl: 2   trolamine salicylate (BLUE-EMU HEMP) 10 % cream, Apply 1 Application topically at bedtime., Disp: , Rfl:   Past Medical History: Past Medical History:  Diagnosis Date   (HFpEF) heart failure with preserved ejection fraction (HCC)    Aortic stenosis, moderate    Arthritis    CAD (coronary artery disease) 05/17/2023   s/p PCI/DES to [facility]   CKD stage 3b, GFR 30-44 ml/min (HCC)    Follicular thyroid  carcinoma (HCC)    History of chicken pox    Hypertension    Hypocalcemia    Obesity    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 Lorie Rook and Dr. Sherene Dilling   Sarcoidosis    Vitiligo     Tobacco Use: Social History   Tobacco Use  Smoking Status Never  Smokeless Tobacco Never    Labs: Review Flowsheet       Latest Ref Rng &  Units 05/17/2023 07/09/2023 09/04/2023  Labs for ITP Cardiac and Pulmonary Rehab  Cholestrol 100 - 199 mg/dL - - 865   LDL (calc) 0 - 99 mg/dL - - 784   HDL-C >69 mg/dL - - 55   Trlycerides 0 - 149 mg/dL - - 629   PH, Arterial 7.35 - 7.45 7.380  - -  PCO2 arterial 32 - 48 mmHg 43.3  - -  Bicarbonate 20.0 - 28.0 mmol/L 25.0  25.0  25.6  - -  TCO2 22 - 32 mmol/L 26  26  27  27   -  Acid-base deficit 0.0 - 2.0 mmol/L 1.0  1.0  - -  O2 Saturation % 69  75  89  - -    Details       Multiple values from one day are sorted in reverse-chronological order         Capillary Blood Glucose: No results found for: "GLUCAP"   Exercise Target Goals: Exercise Program  Goal: Individual exercise prescription set using results from initial 6 min walk test and THRR while considering  patient's activity barriers and safety.   Exercise Prescription Goal: Starting with aerobic activity 30 plus minutes a day, 3 days per week for initial exercise prescription. Provide home exercise prescription and guidelines that participant acknowledges understanding prior to discharge.  Activity Barriers & Risk Stratification:  Activity Barriers & Cardiac Risk Stratification - 07/23/23 0843       Activity Barriers & Cardiac Risk Stratification   Activity Barriers Arthritis;Joint Problems;Left Knee Replacement;Deconditioning;Muscular Weakness;Other (comment);Back Problems;Balance Concerns    Comments arthrisits in R hip and wrist, TKR L knee 2006, occassional back pain by hip    Cardiac Risk Stratification Moderate             6 Minute Walk:  6 Minute Walk     Row Name 07/23/23 0951 10/08/23 1510       6 Minute Walk   Phase Initial Discharge    Distance 1238 feet 1330 feet    Distance Feet Change -- 92 ft    Walk Time 6 minutes 6 minutes    # of Rest Breaks 0 0    MPH 2.34 2.52    METS 2.53 2.19    RPE 15 12    Perceived Dyspnea  1 1    VO2 Peak 8.85 7.67    Symptoms Yes (comment) No    Comments slightly winded, fatgiued at end --    Resting HR 64 bpm 67 bpm    Resting BP 126/70 118/60    Resting Oxygen  Saturation  97 % 97 %    Exercise Oxygen  Saturation  during 6 min walk 96 % 97 %    Max Ex. HR 119 bpm 110 bpm    Max Ex. BP 178/64 130/70    2 Minute Post BP 128/70 120/60             Oxygen  Initial Assessment:   Oxygen  Re-Evaluation:   Oxygen  Discharge (Final Oxygen  Re-Evaluation):   Initial Exercise Prescription:  Initial Exercise Prescription - 07/23/23 0900       Date of Initial Exercise RX and Referring Provider   Date 07/23/23    Referring Provider Alyssa Backbone MD      Oxygen    Maintain Oxygen  Saturation 88% or higher       Treadmill   MPH 1.5    Grade 0.5    Minutes 15    METs 2.25  NuStep   Level 1    SPM 80    Minutes 15    METs 2.5      Prescription Details   Frequency (times per week) 2    Duration Progress to 30 minutes of continuous aerobic without signs/symptoms of physical distress      Intensity   THRR 40-80% of Max Heartrate 96-127    Ratings of Perceived Exertion 11-13    Perceived Dyspnea 0-4      Progression   Progression Continue to progress workloads to maintain intensity without signs/symptoms of physical distress.      Resistance Training   Training Prescription Yes    Weight 3 lb    Reps 10-15             Perform Capillary Blood Glucose checks as needed.  Exercise Prescription Changes:   Exercise Prescription Changes     Row Name 07/23/23 0900 07/25/23 1200 08/13/23 1500 08/27/23 1500 09/05/23 1500     Response to Exercise   Blood Pressure (Admit) 126/70 138/74 158/68 122/60 126/60   Blood Pressure (Exercise) 178/64 142/70 148/62 -- --   Blood Pressure (Exit) 128/70 120/68 142/62 122/62 90/50   Heart Rate (Admit) 64 bpm 86 bpm 60 bpm 71 bpm 73 bpm   Heart Rate (Exercise) 119 bpm 108 bpm 99 bpm 124 bpm 90 bpm   Heart Rate (Exit) 68 bpm 76 bpm 69 bpm 68 bpm 64 bpm   Oxygen  Saturation (Admit) 97 % -- -- -- --   Oxygen  Saturation (Exercise) 96 % -- -- -- --   Rating of Perceived Exertion (Exercise) 15 14 13 13 14    Perceived Dyspnea (Exercise) 1 -- -- -- --   Symptoms slightly SOB, fatigued at end -- -- -- --   Comments walk test results -- -- -- --   Duration -- Continue with 30 min of aerobic exercise without signs/symptoms of physical distress. Continue with 30 min of aerobic exercise without signs/symptoms of physical distress. Continue with 30 min of aerobic exercise without signs/symptoms of physical distress. Continue with 30 min of aerobic exercise without signs/symptoms of physical distress.   Intensity -- THRR unchanged THRR unchanged THRR unchanged  THRR unchanged     Progression   Progression -- Continue to progress workloads to maintain intensity without signs/symptoms of physical distress. Continue to progress workloads to maintain intensity without signs/symptoms of physical distress. Continue to progress workloads to maintain intensity without signs/symptoms of physical distress. Continue to progress workloads to maintain intensity without signs/symptoms of physical distress.     Resistance Training   Training Prescription -- Yes Yes Yes Yes   Weight -- 3 3 3 3    Reps -- 10-15 10-15 10-15 10-15     Treadmill   MPH -- 0.7 -- -- --   Grade -- 0 -- -- --   Minutes -- 15 -- -- --   METs -- 1.57 -- -- --     NuStep   Level -- 1 1 3 3    SPM -- 55 62 62 50   Minutes -- 10 15 15 15    METs -- 1.8 1.9 2 2.1     Track   Laps -- -- 22 30 20    Minutes -- -- 15 15 15    METs -- -- 1.9 2.3 1.87    Row Name 10/08/23 1300             Response to Exercise   Blood Pressure (Admit) 120/60  Blood Pressure (Exit) 120/68       Heart Rate (Admit) 71 bpm       Heart Rate (Exercise) 91 bpm       Heart Rate (Exit) 70 bpm       Rating of Perceived Exertion (Exercise) 14       Duration Continue with 30 min of aerobic exercise without signs/symptoms of physical distress.       Intensity THRR unchanged         Progression   Progression Continue to progress workloads to maintain intensity without signs/symptoms of physical distress.         Resistance Training   Training Prescription Yes       Weight 3       Reps 10-15         NuStep   Level 4       SPM 69       Minutes 15       METs 2.1         Track   Laps 20       Minutes 15       METs 2.93                Exercise Comments:   Exercise Comments     Row Name 07/25/23 1336 10/15/23 1341         Exercise Comments First full day of exercise!  Patient was oriented to gym and equipment including functions, settings, policies, and procedures.  Patient's individual  exercise prescription and treatment plan were reviewed.  All starting workloads were established based on the results of the 6 minute walk test done at initial orientation visit.  The plan for exercise progression was also introduced and progression will be customized based on patient's performance and goals. Prisha graduated today from  rehab with 36 sessions completed.  Details of the patient's exercise prescription and what She needs to do in order to continue the prescription and progress were discussed with patient.  Patient was given a copy of prescription and goals.  Patient verbalized understanding. Johnnae plans to continue to exercise by [facility] at [facility].               Exercise Goals and Review:   Exercise Goals     Row Name 07/23/23 0953             Exercise Goals   Increase Physical Activity Yes       Intervention Provide advice, education, support and counseling about physical activity/exercise needs.;Develop an individualized exercise prescription for aerobic and resistive training based on initial evaluation findings, risk stratification, comorbidities and participant's personal goals.       Expected Outcomes Short Term: Attend rehab on a regular basis to increase amount of physical activity.;Long Term: Add in home exercise to make exercise part of routine and to increase amount of physical activity.;Long Term: Exercising regularly at least 3-5 days a week.       Increase Strength and Stamina Yes       Intervention Provide advice, education, support and counseling about physical activity/exercise needs.;Develop an individualized exercise prescription for aerobic and resistive training based on initial evaluation findings, risk stratification, comorbidities and participant's personal goals.       Expected Outcomes Short Term: Increase workloads from initial exercise prescription for resistance, speed, and METs.;Short Term: Perform resistance training exercises routinely during  rehab and add in resistance training at home;Long Term: Improve cardiorespiratory fitness, muscular endurance and strength  as measured by increased METs and functional capacity ( )       Able to understand and use rate of perceived exertion (RPE) scale Yes       Intervention Provide education and explanation on how to use RPE scale       Expected Outcomes Short Term: Able to use RPE daily in rehab to express subjective intensity level;Long Term:  Able to use RPE to guide intensity level when exercising independently       Able to understand and use Dyspnea scale Yes       Intervention Provide education and explanation on how to use Dyspnea scale       Expected Outcomes Short Term: Able to use Dyspnea scale daily in rehab to express subjective sense of shortness of breath during exertion;Long Term: Able to use Dyspnea scale to guide intensity level when exercising independently       Knowledge and understanding of Target Heart Rate Range (THRR) Yes       Intervention Provide education and explanation of THRR including how the numbers were predicted and where they are located for reference       Expected Outcomes Short Term: Able to state/look up THRR;Long Term: Able to use THRR to govern intensity when exercising independently;Short Term: Able to use daily as guideline for intensity in rehab       Able to check pulse independently Yes       Intervention Provide education and demonstration on how to check pulse in carotid and radial arteries.;Review the importance of being able to check your own pulse for safety during independent exercise       Expected Outcomes Short Term: Able to explain why pulse checking is important during independent exercise;Long Term: Able to check pulse independently and accurately       Understanding of Exercise Prescription Yes       Intervention Provide education, explanation, and written materials on patient's individual exercise prescription       Expected Outcomes  Short Term: Able to explain program exercise prescription;Long Term: Able to explain home exercise prescription to exercise independently                Exercise Goals Re-Evaluation :  Exercise Goals Re-Evaluation     Row Name 07/25/23 1336 07/29/23 1231 09/10/23 1401 10/04/23 1122       Exercise Goal Re-Evaluation   Exercise Goals Review Able to understand and use rate of perceived exertion (RPE) scale;Knowledge and understanding of Target Heart Rate Range (THRR);Able to understand and use Dyspnea scale;Understanding of Exercise Prescription Increase Physical Activity;Increase Strength and Stamina;Understanding of Exercise Prescription Increase Physical Activity;Increase Strength and Stamina;Understanding of Exercise Prescription Increase Physical Activity;Increase Strength and Stamina;Understanding of Exercise Prescription    Comments Reviewed RPE and dyspnea scale, THR and program prescription with pt today.  Pt voiced understanding and was given a copy of goals to take home. Marily Shows just started the rehab program and had a good fist sesson. She is getting use to using the treadmill and nustep. She is exercising at an RPE of 14. Will continue to montior and progress as able, Marily Shows is doing well in rehab. She feels like she has gotten her strength back but still working on stamina.  She also says it is much better than it was previously.  On her off days she is doing more house work and helping her husband with fence and building maintenance.  They still go dancing twice a week on Tuesday nights  and every other Saturday.  It is her favorite activity and they look forward to it every week.  She has been able to get out in her garden and do the weed eating as well. Marily Shows continues to do well in rehab. She stated taht she has noticed an increase in her strength since coming to rehab. She is able to play with her great-grandkids more and goes out dancing every Tuesday night. She does not have to take as  many breakes that she use too.    Expected Outcomes Short: Use RPE daily to regulate intensity.  Long: Follow program prescription in THR. Continue to attend rehab Short: Continue to exercise more on her off days from rehab.  Long; Continue to improve stamina Short: Continue to exercise more on her off days from rehab.  Long; Continue to improve stamina              Discharge Exercise Prescription (Final Exercise Prescription Changes):  Exercise Prescription Changes - 10/08/23 1300       Response to Exercise   Blood Pressure (Admit) 120/60    Blood Pressure (Exit) 120/68    Heart Rate (Admit) 71 bpm    Heart Rate (Exercise) 91 bpm    Heart Rate (Exit) 70 bpm    Rating of Perceived Exertion (Exercise) 14    Duration Continue with 30 min of aerobic exercise without signs/symptoms of physical distress.    Intensity THRR unchanged      Progression   Progression Continue to progress workloads to maintain intensity without signs/symptoms of physical distress.      Resistance Training   Training Prescription Yes    Weight 3    Reps 10-15      NuStep   Level 4    SPM 69    Minutes 15    METs 2.1      Track   Laps 20    Minutes 15    METs 2.93             Nutrition:  Target Goals: Understanding of nutrition guidelines, daily intake of sodium 1500mg , cholesterol 200mg , calories 30% from fat and 7% or less from saturated fats, daily to have 5 or more servings of fruits and vegetables.  Biometrics:  Pre Biometrics - 07/23/23 0953       Pre Biometrics   Height 5\' 3"  (1.6 m)    Weight 90.8 kg    Waist Circumference 35 inches    Hip Circumference 46.5 inches    Waist to Hip Ratio 0.75 %    BMI (Calculated) 35.47    Grip Strength 17.1 kg    Single Leg Stand 4.8 seconds             Post Biometrics - 10/08/23 1513        Post  Biometrics   Height 5\' 3"  (1.6 m)    Weight 90 kg    Waist Circumference 35 inches    Hip Circumference 45 inches    Waist to Hip  Ratio 0.78 %    BMI (Calculated) 35.16    Grip Strength 17 kg    Single Leg Stand 6 seconds             Nutrition Therapy Plan and Nutrition Goals:  Nutrition Therapy & Goals - 07/23/23 0959       Intervention Plan   Intervention Prescribe, educate and counsel regarding individualized specific dietary modifications aiming towards targeted core components such as weight, hypertension, lipid  management, diabetes, heart failure and other comorbidities.;Nutrition handout(s) given to patient.    Expected Outcomes Short Term Goal: Understand basic principles of dietary content, such as calories, fat, sodium, cholesterol and nutrients.;Long Term Goal: Adherence to prescribed nutrition plan.             Nutrition Assessments:  MEDIFICTS Score Key: >=70 Need to make dietary changes  40-70 Heart Healthy Diet <= 40 Therapeutic Level Cholesterol Diet  Flowsheet Row CARDIAC REHAB PHASE II ORIENTATION from 07/23/2023 in [facility] CARDIAC REHABILITATION  Picture Your Plate Total Score on Admission 59      Picture Your Plate Scores: <16 Unhealthy dietary pattern with much room for improvement. 41-50 Dietary pattern unlikely to meet recommendations for good health and room for improvement. 51-60 More healthful dietary pattern, with some room for improvement.  >60 Healthy dietary pattern, although there may be some specific behaviors that could be improved.    Nutrition Goals Re-Evaluation:  Nutrition Goals Re-Evaluation     Row Name 09/10/23 1418 10/04/23 1127           Goals   Nutrition Goal Heart Healthy Eating Heart Healthy eating      Comment Marily Shows is doing well in rehab.  She has been working on her diet and has cut back on her bacon.  She is proud of herself for this.  She is also realizing that if she has a craving then she can have a little bit to satisfy her craving and then let it go. She is living by the everything in moderation.  She is watch her sodium more and trying  to eat better overall. Marily Shows continues to do well in rehab. She likes to eat lots of veggies for meals and will includs beans as well. She has been trying to change her diet to help with her Cholesterol.      Expected Outcome Short; COntinue to use moderation Long: continue to follow heart healthy guidelines Short; COntinue to use moderation Long: continue to follow heart healthy guidelines               Nutrition Goals Discharge (Final Nutrition Goals Re-Evaluation):  Nutrition Goals Re-Evaluation - 10/04/23 1127       Goals   Nutrition Goal Heart Healthy eating    Comment Marily Shows continues to do well in rehab. She likes to eat lots of veggies for meals and will includs beans as well. She has been trying to change her diet to help with her Cholesterol.    Expected Outcome Short; COntinue to use moderation Long: continue to follow heart healthy guidelines             Psychosocial: Target Goals: Acknowledge presence or absence of significant depression and/or stress, maximize coping skills, provide positive support system. Participant is able to verbalize types and ability to use techniques and skills needed for reducing stress and depression.  Initial Review & Psychosocial Screening:  Initial Psych Review & Screening - 07/23/23 0846       Initial Review   Current issues with Current Psychotropic Meds;Current Stress Concerns    Source of Stress Concerns Chronic Illness;Unable to participate in former interests or hobbies;Unable to perform yard/household activities    Comments has lorapam for anixety uses in small doses as needed      Family Dynamics   Good Support System? Yes   Lovely Rubins ([place]), daughter in law Mariah Shines (leans on her too)   Concerns Recent loss of child    Comments  Only son passed last February      Barriers   Psychosocial barriers to participate in program The patient should benefit from training in stress management and relaxation.;Psychosocial barriers  identified (see note)      Screening Interventions   Interventions Encouraged to exercise;Provide feedback about the scores to participant;To provide support and resources with identified psychosocial needs    Expected Outcomes Short Term goal: Utilizing psychosocial counselor, staff and physician to assist with identification of specific Stressors or current issues interfering with healing process. Setting desired goal for each stressor or current issue identified.;Long Term Goal: Stressors or current issues are controlled or eliminated.;Short Term goal: Identification and review with participant of any Quality of Life or Depression concerns found by scoring the questionnaire.;Long Term goal: The participant improves quality of Life and PHQ9 Scores as seen by post scores and/or verbalization of changes             Quality of Life Scores:  Quality of Life - 07/23/23 0958       Quality of Life   Select Quality of Life      Quality of Life Scores   Health/Function Pre 29.6 %    Socioeconomic Pre 30 %    Psych/Spiritual Pre 30 %    Family Pre 30 %    GLOBAL Pre 29.83 %            Scores of 19 and below usually indicate a poorer quality of life in these areas.  A difference of  2-3 points is a clinically meaningful difference.  A difference of 2-3 points in the total score of the Quality of Life Index has been associated with significant improvement in overall quality of life, self-image, physical symptoms, and general health in studies assessing change in quality of life.  PHQ-9: Review Flowsheet       07/23/2023  Depression screen PHQ 2/9  Decreased Interest 0  Down, Depressed, Hopeless 0  PHQ - 2 Score 0  Altered sleeping 0  Tired, decreased energy 0  Change in appetite 0  Feeling bad or failure about yourself  0  Trouble concentrating 0  Moving slowly or fidgety/restless 0  Suicidal thoughts 0  PHQ-9 Score 0  Difficult doing work/chores Not difficult at all    Interpretation of Total Score  Total Score Depression Severity:  1-4 = Minimal depression, 5-9 = Mild depression, 10-14 = Moderate depression, 15-19 = Moderately severe depression, 20-27 = Severe depression   Psychosocial Evaluation and Intervention:  Psychosocial Evaluation - 07/23/23 0954       Psychosocial Evaluation & Interventions   Interventions Stress management education;Relaxation education;Encouraged to exercise with the program and follow exercise prescription    Comments Marily Shows is coming into cardiac rehab after a TAVR.  She also had a stent placed back in December and was unable to start rehab due to TAVR workup. She is eager to get going and has begun to regain her strength already. She wants to get all her strength back and to feel better overall. She lives with her friend Samuel Crock and relies heavily on him.  She does have a history of anxiety but tires to only take a 1/4 tab of lorazapam at a time.  She usually sleeps well but on occassion will wake at night and stress over the loss of her son again and then she will take a full dose to get back to sleep.  She lost her son a year ago in February.  It  was unexpected and still hard on both her and her daughter in law.  They lean on each other for support.  She and Samuel Crock go dancing every week and weekend at the [facility] for exercise and to get out and about.  She is eager to get dancing without stopping again.  She also enjoys wood turning and painting in her down time.  She has no barriers to attending rehab and plans to come twice a week.    Expected Outcomes Short; Attend rehab regularly to build up stamina Long: Able to continue to go dancing every week    Continue Psychosocial Services  Follow up required by staff             Psychosocial Re-Evaluation:  Psychosocial Re-Evaluation     Row Name 09/10/23 1404 10/04/23 1123           Psychosocial Re-Evaluation   Current issues with Current Sleep Concerns;Current Stress  Concerns;Current Anxiety/Panic;Current Psychotropic Meds Current Sleep Concerns;Current Stress Concerns;Current Anxiety/Panic;Current Psychotropic Meds      Comments Marily Shows is doing well in rehab.  She is feeling better overall and feels like she has gotten to a good place mentally.  She admits that there are some nights that she wakes thinking about her son and not able to get back to sleep. She is still dancing each week and looks forward to it each week.  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  She enjoys doing word puzzles and feels like her brain is now more alert than it was before. Marily Shows continues to do well in rehab. She is feeling better and has been enjoying getting back to a healthy lifestyle and going/doing activities she enjoys. She does not have much stres other then some family issues. She continues to go out dancing every Tuesday and is a great stress reliver for her that she looks foward too. Her sleep is doing good, she does wake up some due to pain from her hips. She is overall improving and enjoying getting out to do things      Expected Outcomes Short: Continue to dance for her joy Long: Continue to exercise for mental boost Short: Continue to go dancing for enjoyment and stress relif  long: continue to exercise for mental boost      Interventions Encouraged to attend Cardiac Rehabilitation for the exercise;Stress management education Encouraged to attend Cardiac Rehabilitation for the exercise      Continue Psychosocial Services  Follow up required by staff Follow up required by staff               Psychosocial Discharge (Final Psychosocial Re-Evaluation):  Psychosocial Re-Evaluation - 10/04/23 1123       Psychosocial Re-Evaluation   Current issues with Current Sleep Concerns;Current Stress Concerns;Current Anxiety/Panic;Current Psychotropic Meds    Comments Marily Shows continues to do well in rehab. She is  feeling better and has been enjoying getting back to a healthy lifestyle and going/doing activities she enjoys. She does not have much stres other then some family issues. She continues to go out dancing every Tuesday and is a great stress reliver for her that she looks foward too. Her sleep is doing good, she does wake up some due to pain from her hips. She is overall improving and enjoying getting out to do things    Expected Outcomes Short: Continue to go dancing for enjoyment and stress relif  long:  continue to exercise for mental boost    Interventions Encouraged to attend Cardiac Rehabilitation for the exercise    Continue Psychosocial Services  Follow up required by staff             Vocational Rehabilitation: Provide vocational rehab assistance to qualifying candidates.   Vocational Rehab Evaluation & Intervention:  Vocational Rehab - 07/23/23 0848       Initial Vocational Rehab Evaluation & Intervention   Assessment shows need for Vocational Rehabilitation No   retired            Education: Education Goals: Education classes will be provided on a weekly basis, covering required topics. Participant will state understanding/return demonstration of topics presented.  Learning Barriers/Preferences:  Learning Barriers/Preferences - 07/23/23 0844       Learning Barriers/Preferences   Learning Barriers Sight   glasses for reading   Learning Preferences None             Education Topics: Hypertension, Hypertension Reduction -Define heart disease and high blood pressure. Discus how high blood pressure affects the body and ways to reduce high blood pressure.   Exercise and Your Heart -Discuss why it is important to exercise, the FITT principles of exercise, normal and abnormal responses to exercise, and how to exercise safely. Flowsheet Row CARDIAC REHAB PHASE II EXERCISE from 10/03/2023 in [place] CARDIAC REHABILITATION  Date 09/05/23  [09/18/23 Resistance,  Flexibility, Balance (JH)]  Educator hb  Instruction Review Code 1- Verbalizes Understanding       Angina -Discuss definition of angina, causes of angina, treatment of angina, and how to decrease risk of having angina. Flowsheet Row CARDIAC REHAB PHASE II EXERCISE from 10/03/2023 in [place] CARDIAC REHABILITATION  Date 08/29/23  [tests/procedures]  Educator HB  Instruction Review Code 1- Verbalizes Understanding       Cardiac Medications -Review what the following cardiac medications are used for, how they affect the body, and side effects that may occur when taking the medications.  Medications include Aspirin , Beta blockers, calcium channel blockers, ACE Inhibitors, angiotensin receptor blockers, diuretics, digoxin, and antihyperlipidemics. Flowsheet Row CARDIAC REHAB PHASE II EXERCISE from 10/03/2023 in [place] CARDIAC REHABILITATION  Date 08/15/23  Educator hb  Instruction Review Code 1- Verbalizes Understanding       Congestive Heart Failure -Discuss the definition of CHF, how to live with CHF, the signs and symptoms of CHF, and how keep track of weight and sodium intake. Flowsheet Row CARDIAC REHAB PHASE II EXERCISE from 10/03/2023 in [place] CARDIAC REHABILITATION  Date 08/08/23  Educator [facility]  Instruction Review Code 1- Verbalizes Understanding       Heart Disease and Intimacy -Discus the effect sexual activity has on the heart, how changes occur during intimacy as we age, and safety during sexual activity. Flowsheet Row CARDIAC REHAB PHASE II EXERCISE from 10/03/2023 in [place] CARDIAC REHABILITATION  Date 09/12/23  Educator jh  Instruction Review Code 1- Verbalizes Understanding       Smoking Cessation / COPD -Discuss different methods to quit smoking, the health benefits of quitting smoking, and the definition of COPD.   Nutrition I: Fats -Discuss the types of cholesterol, what cholesterol does to the heart, and how cholesterol levels can be  controlled. Flowsheet Row CARDIAC REHAB PHASE II EXERCISE from 10/03/2023 in [place] CARDIAC REHABILITATION  Date 07/25/23  Educator [facility]  Instruction Review Code 1- Verbalizes Understanding       Nutrition II: Labels -Discuss the different components of food  labels and how to read food label Flowsheet Row CARDIAC REHAB PHASE II EXERCISE from 10/03/2023 in [place] CARDIAC REHABILITATION  Date 09/25/23  Educator [facility]  Instruction Review Code 1- Verbalizes Understanding       Heart Parts/Heart Disease and PAD -Discuss the anatomy of the heart, the pathway of blood circulation through the heart, and these are affected by heart disease.   Stress I: Signs and Symptoms -Discuss the causes of stress, how stress may lead to anxiety and depression, and ways to limit stress. Flowsheet Row CARDIAC REHAB PHASE II EXERCISE from 10/03/2023 in [place] CARDIAC REHABILITATION  Date 08/01/23  Educator DJ  Instruction Review Code 1- Verbalizes Understanding       Stress II: Relaxation -Discuss different types of relaxation techniques to limit stress. Flowsheet Row CARDIAC REHAB PHASE II EXERCISE from 10/03/2023 in [place] CARDIAC REHABILITATION  Date 10/03/23  Educator [facility]  Instruction Review Code 1- Verbalizes Understanding       Warning Signs of Stroke / TIA -Discuss definition of a stroke, what the signs and symptoms are of a stroke, and how to identify when someone is having stroke.   Knowledge Questionnaire Score:  Knowledge Questionnaire Score - 07/23/23 0959       Knowledge Questionnaire Score   Pre Score 22/26             Core Components/Risk Factors/Patient Goals at Admission:  Personal Goals and Risk Factors at Admission - 07/23/23 0959       Core Components/Risk Factors/Patient Goals on Admission    Weight Management Yes;Obesity;Weight Loss    Intervention Weight Management: Develop a combined nutrition and exercise program designed to reach desired  caloric intake, while maintaining appropriate intake of nutrient and fiber, sodium and fats, and appropriate energy expenditure required for the weight goal.;Weight Management: Provide education and appropriate resources to help participant work on and attain dietary goals.;Weight Management/Obesity: Establish reasonable short term and long term weight goals.;Obesity: Provide education and appropriate resources to help participant work on and attain dietary goals.    Admit Weight 200 lb 3.2 oz (90.8 kg)    Goal Weight: Short Term 195 lb (88.5 kg)    Goal Weight: Long Term 190 lb (86.2 kg)    Expected Outcomes Short Term: Continue to assess and modify interventions until short term weight is achieved;Long Term: Adherence to nutrition and physical activity/exercise program aimed toward attainment of established weight goal;Weight Loss: Understanding of general recommendations for a balanced deficit meal plan, which promotes 1-2 lb weight loss per week and includes a negative energy balance of [phone] kcal/d;Understanding recommendations for meals to include 15-35% energy as protein, 25-35% energy from fat, 35-60% energy from carbohydrates, less than 200mg  of dietary cholesterol, 20-35 gm of total fiber daily;Understanding of distribution of calorie intake throughout the day with the consumption of 4-5 meals/snacks    Hypertension Yes    Intervention Provide education on lifestyle modifcations including regular physical activity/exercise, weight management, moderate sodium restriction and increased consumption of fresh fruit, vegetables, and low fat dairy, alcohol  moderation, and smoking cessation.;Monitor prescription use compliance.    Expected Outcomes Short Term: Continued assessment and intervention until BP is < 140/32mm HG in hypertensive participants. < 130/39mm HG in hypertensive participants with diabetes, heart failure or chronic kidney disease.;Long Term: Maintenance of blood pressure at goal  levels.    Lipids Yes    Intervention Provide education and support for participant on nutrition & aerobic/resistive exercise along with prescribed medications to achieve  LDL 70mg , HDL >40mg .    Expected Outcomes Short Term: Participant states understanding of desired cholesterol values and is compliant with medications prescribed. Participant is following exercise prescription and nutrition guidelines.;Long Term: Cholesterol controlled with medications as prescribed, with individualized exercise RX and with personalized nutrition plan. Value goals: LDL < 70mg , HDL > 40 mg.             Core Components/Risk Factors/Patient Goals Review:   Goals and Risk Factor Review     Row Name 09/10/23 1420 10/04/23 1132           Core Components/Risk Factors/Patient Goals Review   Personal Goals Review Weight Management/Obesity;Hypertension;Lipids Weight Management/Obesity;Hypertension;Lipids      Review Marily Shows is doing well in rehab.  She is maintianing her weight and pleased with how she is doing.  Her pressures have been good for most part. She has had a couple of low readings but only one bout of dizziness with it that passed quickly.  She is trying to drink enough. She also mentioned that if her weight creeps up she will take and extra fluid pill. Marily Shows is doing well in rehab. She is maintiaing her weight and continues to be happy. She is watching what she eats and fluids due to gaining fluid somedays. She weighs herslef to watch her fluid gain. She checks her blood pressure every other day.      Expected Outcomes Short: Conitnue to watch bp and weight closely Long; continue to montior risk factors Short: Conitnue to watch bp and weight closely for fluid  Long; continue to montior risk factors               Core Components/Risk Factors/Patient Goals at Discharge (Final Review):   Goals and Risk Factor Review - 10/04/23 1132       Core Components/Risk Factors/Patient Goals Review   Personal  Goals Review Weight Management/Obesity;Hypertension;Lipids    Review Marily Shows is doing well in rehab. She is maintiaing her weight and continues to be happy. She is watching what she eats and fluids due to gaining fluid somedays. She weighs herslef to watch her fluid gain. She checks her blood pressure every other day.    Expected Outcomes Short: Conitnue to watch bp and weight closely for fluid  Long; continue to montior risk factors             ITP Comments:  ITP Comments     Row Name 07/23/23 0951 07/25/23 1336 08/14/23 1159 09/11/23 1504 10/09/23 1142   ITP Comments Patient attend orientation today.  Patient is attending Cardiac Rehabilitation Program.  Documentation for diagnosis can be found in [facility] 07/10/23.  Reviewed medical chart, RPE/RPD, gym safety and program guidelines.  Patient was fitted to equipment they will be using during rehab.  Patient is scheduled to start exercise on Thursday 07/25/23 at 1330.   Initial ITP created and sent for review and signature by Dr. Armida Lander, Medical Director for Cardiac Rehabilitation Program. First full day of exercise!  Patient was oriented to gym and equipment including functions, settings, policies, and procedures.  Patient's individual exercise prescription and treatment plan were reviewed.  All starting workloads were established based on the results of the 6 minute walk test done at initial orientation visit.  The plan for exercise progression was also introduced and progression will be customized based on patient's performance and goals. 30 day review completed. ITP sent to Dr. Armida Lander, Medical Director of Cardiac Rehab. Continue with ITP unless changes are  made by physician.  Newer to program 30 day review completed. ITP sent to Dr. Armida Lander, Medical Director of Cardiac Rehab. Continue with ITP unless changes are made by physician. 30 day review completed. ITP sent to Dr. Armida Lander, Medical Director of Cardiac Rehab. Continue  with ITP unless changes are made by physician.    Row Name 10/15/23 1341           ITP Comments Jaena graduated today from  rehab with 36 sessions completed.  Details of the patient's exercise prescription and what She needs to do in order to continue the prescription and progress were discussed with patient.  Patient was given a copy of prescription and goals.  Patient verbalized understanding. Mayzie plans to continue to exercise by exwercisen at [facility].                Comments: Discharge ITP

## 2023-10-15 NOTE — Progress Notes (Signed)
 Daily Session Note  Patient Details  Name: Samantha Wood MRN: [id number] Date of Birth: 1945/06/04 Referring Provider:   Flowsheet Row CARDIAC REHAB PHASE II ORIENTATION from 07/23/2023 in [facility]AC RE[facility]  Referring Provider Alyssa Backbone MD       Encounter Date: 10/15/2023  Check In:  Session Check In - 10/15/23 1349       Check-In   Supervising physician immediately available to respond to emergencies See telemetry face sheet for immediately available MD    Location [facility]    Staff Present Clotilda Danish, BS, Exercise Physiologist;Barri Neidlinger Eilene Grater, RN;Brittany Annette Barters, BSN, RN, WTA-C    Virtual Visit No    Medication changes reported     No    Fall or balance concerns reported    No    Warm-up and Cool-down Performed on first and last piece of equipment    Resistance Training Performed Yes    VAD Patient? No    PAD/SET Patient? No      Pain Assessment   Currently in Pain? No/denies             Capillary Blood Glucose: No results found for this or any previous visit (from the past 24 hours).    Social History   Tobacco Use  Smoking Status Never  Smokeless Tobacco Never    Goals Met:  Independence with exercise equipment Exercise tolerated well Strength training completed today  Goals Unmet:  Not Applicable  Comments: Pt able to follow exercise prescription today without complaint.  Will continue to monitor for progression.

## 2023-10-17 ENCOUNTER — Encounter (HOSPITAL_COMMUNITY)

## 2023-10-17 NOTE — Progress Notes (Unsigned)
 Patient ID: Samantha Wood MRN: [id number] DOB/AGE: Oct 16, 1945 78 y.o.  Primary Care Physician:Daniel, Leeanna Puff, MD Primary Cardiologist: Lorie Rook  CC:  Aortic valvular disease management     FOCUSED PROBLEM LIST:   FOCUSED CARDIOVASCULAR PROBLEM LIST:   Moderate aortic stenosis  S/p TAVR 23 S3 February 2025 Progress research trial Coronary artery disease Status post PCI RCA December 2024  HTN Hyperlipidemia Patient declined statins CKD stage IIIb Osteoarthritis  HISTORY OF PRESENT ILLNESS:  June 2024:  The patient is a 78 y.o. female with the indicated medical history here for recommendations regarding her aortic stenosis.  She was seen by her primary cardiologist Dr. Garlon Jupiter in April of this year.  At that appointment she noted increasing exertional chest discomfort, dizziness, and fatigue.  An echocardiogram was performed which demonstrated moderate to severe calcific aortic stenosis with preserved ejection fraction.  The patient tells me that compared to a few years ago she believes she has slowed down somewhat but this is obscured by the fact that she has osteoarthritis and she feels that a lot of this is due to her age.  She is unable to tell me whether she gets all that much more short of breath versus a few years ago.  She does need to take breaks when she mows her lawn and does housework.  She denies any exertional angina.  She denies any frank syncope.  She occasionally gets lightheaded when she goes from sitting to standing but this is very rare.  She fortunately has not required any emergency room visits or hospitalizations recently.  She underwent cardiac catheterization a few years ago through the femoral approach which was complicated by hematoma.  She is very concerned about this.  She tells me that she does need dental work and is planning on getting this addressed in the coming months.  Plan: Despite NYHA class II symptoms the patient elected to defer evaluation;  follow-up in 6 months and patient to pursue dental evaluation.  December 2024: TTE today demonstrated a mean aortic valve gradient of around 32 mmHg with a peak velocity of 3.5 m/s with preserved LV function consistent with moderate to severe aortic stenosis with an aortic valve area of 0.91.  The patient is much more symptomatic.  She tires out quite often and endorses NYHA class II symptoms of shortness of breath and fatigue.  She occasionally develops chest tightness.  She occasionally gets lightheaded when she goes from sitting to standing quickly but fortunately she has not had any syncope.  She fortunately does not required any emergency room visits.  She did see her dentist and had her dental issues treated completely.  Plan: Refer for inclusion in progress research trial.  June 2025:  Patient consents to use of AI scribe. Since I saw her the patient underwent TAVR without complications in February.  She was seen in follow-up in March and was doing well with some mild shortness of breath.  She was otherwise doing well.           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 Lorie Rook and Dr. Sherene Dilling   Sarcoidosis    Vitiligo     Past Surgical History:  Procedure Laterality Date   ABDOMINAL HYSTERECTOMY  10/1984   CHOLECYSTECTOMY  07/1992   three weeks after gallstone removal   CORONARY STENT INTERVENTION N/A 05/17/2023   Procedure: CORONARY STENT INTERVENTION;  Surgeon: Kyra Phy, MD;  Location: [facility]  INVASIVE CV LAB;  Service: Cardiovascular;  Laterality: N/A;   Gallstone removal from Bile Duct  07/1992   INTRAOPERATIVE TRANSTHORACIC ECHOCARDIOGRAM N/A 07/09/2023   Procedure: INTRAOPERATIVE TRANSTHORACIC ECHOCARDIOGRAM;  Surgeon: Kyra Phy, MD;  Location: [facility] INVASIVE CV LAB;  Service: Cardiovascular;  Laterality: N/A;   RIGHT HEART CATH AND CORONARY ANGIOGRAPHY N/A 05/17/2023   Procedure: RIGHT HEART CATH AND CORONARY ANGIOGRAPHY;  Surgeon: Kyra Phy, MD;  Location: [facility] INVASIVE CV LAB;  Service: Cardiovascular;  Laterality: N/A;   TOTAL THYROIDECTOMY  12/2011   TUBAL LIGATION  10/1980   TUMOR EXCISION  05/1972   left side of thyroid     Family History  Problem Relation Age of Onset   Heart disease Mother    Stroke Mother    Hypertension Mother    Diabetes Mother    Heart disease Father    Diabetes Maternal Grandmother     Social History   Socioeconomic History   Marital status: Widowed    Spouse name: Not on file   Number of children: 1   Years of education: 14   Highest education level: Not on file  Occupational History   Occupation: Retired  Tobacco Use   Smoking status: Never   Smokeless tobacco: Never  Substance and Sexual Activity   Alcohol  use: No   Drug use: Not on file   Sexual activity: Not on file  Other Topics Concern   Not on file  Social History Narrative   Regular exercise-yes   Caffeine Use-no   Social Drivers of Health   Financial Resource Strain: Not on file  Food Insecurity: No Food Insecurity (07/10/2023)   Hunger Vital Sign    Worried About Running Out of Food in the Last Year: Never true    Ran Out of Food in the Last Year: Never true  Transportation Needs: No Transportation Needs (07/10/2023)   PRAPARE - Administrator, Civil Service (Medical): No    Lack of Transportation (Non-Medical): No  Physical Activity: Not on file  Stress: Not on file  Social Connections: Socially Isolated (07/10/2023)   Social Connection  and Isolation Panel [NHANES]    Frequency of Communication with Friends and Family: Never    Frequency of Social Gatherings with Friends and Family: Never    Attends Religious Services: Never    Database administrator or Organizations: No    Attends Banker Meetings: Never    Marital Status: Married  Catering manager Violence: Not At Risk (07/10/2023)   Humiliation, Afraid, Rape, and Kick questionnaire    Fear of Current or Ex-Partner: No    Emotionally Abused: No    Physically Abused: No    Sexually Abused: No     Prior to Admission medications   Medication Sig Start Date End Date Taking? Authorizing Provider  acetaminophen  (TYLENOL ) 650 MG CR tablet Take 1,300 mg by mouth 2 (two) times daily.    [provider]  aspirin  EC 81 MG tablet Take 1 tablet (81 mg total) by mouth daily. Swallow whole. 05/17/23   Liban Guedes K, MD  azithromycin  (ZITHROMAX ) 500 MG tablet Take 1 tablet (500 mg total) by mouth as directed. Take one tablet 1 hour before any dental work including cleanings. 07/17/23   Ardia Kraft, PA-C  calcitRIOL (ROCALTROL) 0.25 MCG capsule Take 0.25 mcg by mouth 2 (two) times daily.  01/23/12   [provider]  clopidogrel  (PLAVIX ) 75 MG tablet Take 1 tablet (75 mg total) by mouth daily. 05/17/23   Mahlia Fernando K, MD  Cranberry-Vitamin C-Probiotic (AZO CRANBERRY PO) Take 1 tablet by mouth 2 (two) times daily.    [provider]  diphenhydrAMINE  (BENADRYL ) 25 MG tablet Take 50 mg by mouth at bedtime.    [provider]  estradiol  (ESTRACE ) 1 MG tablet Take 1 mg by mouth daily.  01/04/12   [provider]  ezetimibe  (ZETIA ) 10 MG tablet Take 1 tablet (10 mg total) by mouth daily. 06/03/23 09/01/23  Marlyse Single T, PA-C  furosemide  (LASIX ) 20 MG tablet Take 20 mg by mouth daily as needed for fluid.    [provider]  levothyroxine  (SYNTHROID ) 137 MCG tablet Take 137 mcg by mouth daily before breakfast. 04/29/23    [provider]  lisinopril -hydrochlorothiazide (PRINZIDE,ZESTORETIC) 20-12.5 MG per tablet Take 1 tablet by mouth daily.  02/02/12   [provider]  LORazepam  (ATIVAN ) 0.5 MG tablet Take 0.125 mg by mouth every 8 (eight) hours as needed for anxiety. 12/14/11   [provider]  Multiple Minerals-Vitamins (CALCIUM-MAGNESIUM -ZINC-D3 PO) Take 2 tablets by mouth 2 (two) times daily.    [provider]  nitroGLYCERIN  (NITROSTAT ) 0.4 MG SL tablet Place 1 tablet (0.4 mg total) under the tongue every 5 (five) minutes as needed for chest pain. 05/17/23 05/16/24  Burnetta Cart, PA-C  trolamine salicylate (BLUE-EMU HEMP) 10 % cream Apply 1 Application topically at bedtime.    [provider]    Allergies  Allergen Reactions   Amoxicillin Nausea And Vomiting    Headache   Codeine Other (See Comments)    "Bad headache"   Diltiazem Other (See Comments)    "Bad headache"    REVIEW OF SYSTEMS:  General: no fevers/chills/night sweats Eyes: no blurry vision, diplopia, or amaurosis ENT: no sore throat or hearing loss Resp: no cough, wheezing, or hemoptysis CV: no edema or palpitations GI: no abdominal pain, nausea, vomiting, diarrhea, or constipation GU: no dysuria, frequency, or hematuria Skin: no rash Neuro: no headache, numbness, tingling, or weakness of extremities Musculoskeletal: no joint pain or swelling Heme: no bleeding, DVT, or easy bruising Endo: no polydipsia or polyuria  There were no vitals taken for this visit.  PHYSICAL EXAM: GEN:  AO x 3 in no acute distress HEENT: normal Dentition: Normal*** Neck: JVP normal. +2***carotid upstrokes without bruits. No thyromegaly. Lungs: equal expansion, clear bilaterally CV: Apex is discrete and nondisplaced, RRR without murmur or gallop*** Abd: soft, non-tender, non-distended; no bruit; positive bowel sounds Ext: no edema, ecchymoses, or cyanosis Vascular: 2+ femoral pulses, 2+ radial pulses        Skin: warm and dry without rash Neuro: CN II-XII grossly intact; motor and sensory grossly intact    DATA AND STUDIES:  EKG:  EKG Interpretation Date/Time:    Ventricular Rate:    PR Interval:    QRS Duration:    QT Interval:    QTC Calculation:   R Axis:  Text Interpretation:          Cardiac Studies & Procedures   ______________________________________________________________________________________________ CARDIAC CATHETERIZATION  CARDIAC CATHETERIZATION 05/17/2023  Conclusion   Mid RCA lesion is 90% stenosed.   A stent was successfully placed.   Post intervention, there is a 0% residual stenosis.  1.  High-grade mid right coronary artery lesion treated with 1 drug-eluting stent. 2.  Fick cardiac output of 10.1 L/min and Fick cardiac index of 5.0 L/min/m with the following hemodynamics: Right atrial pressure mean of 18 mmHg. RV 48/10 with an end-diastolic pressure of 23 mmHg. Wedge pressure mean of 27 mmHg with V waves to 34 mmHg. PA pressure 50/25 with a mean of 34 mmHg PVR of 0.7 Woods units PA pulsatility index of 1.4  Recommendation: Dual antiplatelet therapy for 6 months followed by indefinite Plavix  monotherapy; start Lasix  20 mg twice daily due to elevated filling pressures and check BMP in 1 week.  Will determine whether patient remains a candidate for PROGRESS CAP research program.  Findings Coronary Findings Diagnostic  Dominance: Co-dominant  Right Coronary Artery Mid RCA lesion is 90% stenosed.  Intervention  Mid RCA lesion Stent A stent was successfully placed. Post-Intervention Lesion Assessment The intervention was successful. Pre-interventional TIMI flow is 3. Post-intervention TIMI flow is 3. There is a 0% residual stenosis post intervention.     ECHOCARDIOGRAM  ECHOCARDIOGRAM COMPLETE 08/09/2023  Narrative ECHOCARDIOGRAM REPORT    Patient Name:   Samantha Wood Date of Exam: 08/09/2023 Medical Rec #:  [id number]      Height:       63.0 in Accession #:    [id number]    Weight:       200.2 lb Date of Birth:  24-Jun-1945     BSA:          1.934 m Patient Age:    77 years      BP:           124/78 mmHg Patient Gender: F             HR:           65 bpm. Exam Location:  [address]  Procedure: 2D Echo, 3D Echo and Strain Analysis (Both Spectral and Color Flow Doppler were utilized during procedure).  Indications:    Z95.2 Post TAVR evaluation (26mm Edwards Sapien 3 Ultra Resilia)  History:        Patient has prior history of Echocardiogram examinations, most recent 07/10/2023. CHF, CAD; Risk Factors:Hypertension and Dyslipidemia. Aortic stenosis. Chronic kidney disease. Obesity. Sarcoidosis. Aortic Valve: 26 mm Sapien prosthetic, stented (TAVR) valve is present in the aortic position.  Sonographer:    Mylinda Asa RCS Referring Phys: [id number] KATHRYN R THOMPSON  IMPRESSIONS   1. Left ventricular ejection fraction, by estimation, is 55 to 60%. Left ventricular ejection fraction by 3D volume is 57 %. The left ventricle has normal function. The left ventricle has no regional Gest motion abnormalities. Left ventricular diastolic parameters are consistent with Grade I diastolic dysfunction (impaired relaxation). The average left ventricular global longitudinal strain is -21.9 %. The global longitudinal strain is normal. 2. Right ventricular systolic function is normal. The right ventricular size is normal. There is normal pulmonary artery systolic pressure. The estimated right ventricular systolic pressure is 25.1 mmHg. 3. Left atrial size was mildly dilated. 4. The mitral valve is degenerative. Trivial mitral valve regurgitation. No evidence of mitral stenosis. Moderate mitral annular calcification. 5. Tricuspid valve regurgitation is mild to moderate. 6.  The aortic valve has been repaired/replaced. Aortic valve regurgitation is not visualized. No aortic stenosis is present. There is a 26 mm Sapien  prosthetic (TAVR) valve present in the aortic position. Aortic valve area, by VTI measures 2.71 cm. Aortic valve mean gradient measures 10.5 mmHg. Aortic valve Vmax measures 2.26 m/s. 7. The inferior vena cava is normal in size with greater than 50% respiratory variability, suggesting right atrial pressure of 3 mmHg.  FINDINGS Left Ventricle: Left ventricular ejection fraction, by estimation, is 55 to 60%. Left ventricular ejection fraction by 3D volume is 57 %. The left ventricle has normal function. The left ventricle has no regional Mcbryar motion abnormalities. The average left ventricular global longitudinal strain is -21.9 %. Strain was performed and the global longitudinal strain is normal. The left ventricular internal cavity size was normal in size. There is no left ventricular hypertrophy. Left ventricular diastolic parameters are consistent with Grade I diastolic dysfunction (impaired relaxation).  Right Ventricle: The right ventricular size is normal. No increase in right ventricular Zill thickness. Right ventricular systolic function is normal. There is normal pulmonary artery systolic pressure. The tricuspid regurgitant velocity is 2.35 m/s, and with an assumed right atrial pressure of 3 mmHg, the estimated right ventricular systolic pressure is 25.1 mmHg.  Left Atrium: Left atrial size was mildly dilated.  Right Atrium: Right atrial size was normal in size.  Pericardium: There is no evidence of pericardial effusion.  Mitral Valve: The mitral valve is degenerative in appearance. There is moderate thickening of the mitral valve leaflet(s). There is moderate calcification of the mitral valve leaflet(s). Moderate mitral annular calcification. Trivial mitral valve regurgitation. No evidence of mitral valve stenosis. The mean mitral valve gradient is 3.0 mmHg.  Tricuspid Valve: The tricuspid valve is normal in structure. Tricuspid valve regurgitation is mild to moderate. No evidence of  tricuspid stenosis.  Aortic Valve: The aortic valve has been repaired/replaced. Aortic valve regurgitation is not visualized. No aortic stenosis is present. Aortic valve mean gradient measures 10.5 mmHg. Aortic valve peak gradient measures 20.5 mmHg. Aortic valve area, by VTI measures 2.71 cm. There is a 26 mm Sapien prosthetic, stented (TAVR) valve present in the aortic position.  Pulmonic Valve: The pulmonic valve was normal in structure. Pulmonic valve regurgitation is not visualized. No evidence of pulmonic stenosis.  Aorta: The aortic root is normal in size and structure.  Venous: The inferior vena cava is normal in size with greater than 50% respiratory variability, suggesting right atrial pressure of 3 mmHg.  IAS/Shunts: No atrial level shunt detected by color flow Doppler.  Additional Comments: 3D was performed not requiring image post processing on an independent workstation and was normal.   LEFT VENTRICLE PLAX 2D LVIDd:         4.50 cm         Diastology LVIDs:         3.20 cm         LV e' medial:    5.77 cm/s LV PW:         0.90 cm         LV E/e' medial:  16.0 LV IVS:        0.80 cm         LV e' lateral:   6.74 cm/s LVOT diam:     2.60 cm         LV E/e' lateral: 13.7 LV SV:         135 LV SV Index:  70              2D Longitudinal LVOT Area:     5.31 cm        Strain 2D Strain GLS   -21.3 % (A4C): 2D Strain GLS   -18.0 % (A3C): 2D Strain GLS   -26.3 % (A2C): 2D Strain GLS   -21.9 % Avg:  3D Volume EF LV 3D EF:    Left ventricul ar ejection fraction by 3D volume is 57 %.  3D Volume EF: 3D EF:        57 % LV EDV:       116 ml LV ESV:       50 ml LV SV:        65 ml  RIGHT VENTRICLE RV Basal diam:  3.40 cm RV S prime:     12.50 cm/s TAPSE (M-mode): 2.6 cm RVSP:           25.1 mmHg  LEFT ATRIUM             Index        RIGHT ATRIUM           Index LA diam:        4.50 cm 2.33 cm/m   RA Pressure: 3.00 mmHg LA Vol (A2C):   51.1 ml 26.42 ml/m   RA Area:     13.30 cm LA Vol (A4C):   69.9 ml 36.13 ml/m  RA Volume:   29.40 ml  15.20 ml/m LA Biplane Vol: 61.6 ml 31.84 ml/m AORTIC VALVE AV Area (Vmax):    2.25 cm AV Area (Vmean):   1.98 cm AV Area (VTI):     2.71 cm AV Vmax:           226.50 cm/s AV Vmean:          150.500 cm/s AV VTI:            0.500 m AV Peak Grad:      20.5 mmHg AV Mean Grad:      10.5 mmHg LVOT Vmax:         95.80 cm/s LVOT Vmean:        56.000 cm/s LVOT VTI:          0.255 m LVOT/AV VTI ratio: 0.51  AORTA Ao Asc diam: 3.10 cm  MITRAL VALVE                TRICUSPID VALVE MV Area (PHT): 2.32 cm     TR Peak grad:   22.1 mmHg MV Mean grad:  3.0 mmHg     TR Vmax:        235.00 cm/s MV Decel Time: 327 msec     Estimated RAP:  3.00 mmHg MV E velocity: 92.20 cm/s   RVSP:           25.1 mmHg MV A velocity: 122.00 cm/s MV E/A ratio:  0.76         SHUNTS Systemic VTI:  0.26 m Systemic Diam: 2.60 cm  Dorothye Gathers MD Electronically signed by Dorothye Gathers MD Signature Date/Time: 08/09/2023/11:00:46 AM    Final      CT SCANS  CT CORONARY MORPH W/CTA COR W/SCORE 06/03/2023  Addendum 06/10/2023  4:17 PM ADDENDUM REPORT: 06/10/2023 16:15  EXAM: OVER-READ INTERPRETATION  CT CHEST  The following report is an over-read performed by radiologist Dr. Chadwick Colonel of [facility] on 06/10/2023. This over-read does not include interpretation of cardiac or coronary anatomy  or pathology. The coronary CTA interpretation by the cardiologist is attached.  COMPARISON:  Concurrent full field of view chest CTA, reported separately.  FINDINGS: Vascular: Aortic atherosclerosis. The included aorta is normal in caliber.  Mediastinum/nodes: No adenopathy or mass.  Decompressed esophagus.  Lungs: No focal airspace disease. 8 mm a subpleural left upper lobe nodule series 6, image 25 4 mm perifissural left upper lobe nodule series 6, image 29, typical of an intrapulmonary lymph node. No pleural  fluid. The included airways are patent.  Upper abdomen: No acute or unexpected findings.  Musculoskeletal: There are no acute or suspicious osseous abnormalities.  IMPRESSION: 1.  Aortic Atherosclerosis (ICD10-I70.0). 2. Pleural based 8 mm left upper lobe nodule. Per Fleischner Society guidelines, noncontrast chest CT at 6-12 months is recommended.   Electronically Signed By: Chadwick Colonel M.D. On: 06/10/2023 16:15  Narrative CLINICAL DATA:  Severe Aortic Stenosis.  EXAM: Cardiac TAVR CT  TECHNIQUE: A non-contrast, gated CT scan was obtained with axial slices of 3 mm through the heart for aortic valve calcium scoring. A 110 kV retrospective, gated, contrast cardiac scan was obtained. Gantry rotation speed was 250 msecs and collimation was 0.6 mm. Nitroglycerin  was not given. The 3D data set was reconstructed in 5% intervals of the 0-95% of the R-R cycle. Systolic and diastolic phases were analyzed on a dedicated workstation using MPR, MIP, and VRT modes. The patient received 100 cc of contrast.  FINDINGS: Image quality: Excellent.  Noise artifact is: Limited.  Valve Morphology: Tricuspid aortic valve with severely calcified leaflets and severely restricted leaflet motion in systole. Bulky calcification of the NCC.  Aortic Valve Calcium score: 1958  Aortic annular dimension:  Phase assessed: 35%  Annular area: 450 mm2  Annular perimeter: 76.3 mm  Max diameter: 26.6 mm  Min diameter: 21.8 mm  Annular and subannular calcification: None.  Membranous septum length: 4.8 mm  Optimal coplanar projection: LAO 26 CRA 6  Coronary Artery Height above Annulus:  Left Main: 16.0 mm  Right Coronary: 13.3 mm  Sinus of Valsalva Measurements:  Non-coronary: 31 mm  Right-coronary: 30 mm  Left-coronary: 30 mm  Sinus of Valsalva Height:  Non-coronary: 20.7 mm  Right-coronary: 21.7 mm  Left-coronary: 22.4 mm  Sinotubular Junction: 26 mm  Ascending  Thoracic Aorta: 31 mm  Coronary Arteries: Normal coronary origin. Right dominance. The study was performed without use of NTG and is insufficient for plaque evaluation. Please refer to recent cardiac catheterization for coronary assessment.  Cardiac Morphology:  Right Atrium: Right atrial size is within normal limits.  Right Ventricle: The right ventricular cavity is within normal limits.  Left Atrium: Left atrial size is dilated with no left atrial appendage filling defect.  Left Ventricle: The ventricular cavity size is within normal limits.  Pulmonary arteries: Normal in size without proximal filling defect.  Pulmonary veins: Normal pulmonary venous drainage.  Pericardium: Normal thickness with no significant effusion or calcium present.  Mitral Valve: The mitral valve is normal structure with mild annular calcium.  Extra-cardiac findings: See attached radiology report for non-cardiac structures.  IMPRESSION: 1. Annular measurements support a 26 mm S3 or 29 mm Evolut Pro TAVR.  2. No significant annular or subannular calcifications.  3. Sufficient coronary to annulus distance.  4. Optimal Fluoroscopic Angle for Delivery: LAO 26 CRA 6  [place] T. Rolm Clos, MD  Electronically Signed: By: Jackquelyn Mass M.D. On: 06/03/2023 15:09     ______________________________________________________________________________________________      08/09/2023: Hemoglobin 14.2; NT-Pro BNP 434;  Platelets 194 09/04/2023: ALT 10; BUN 23; Creatinine, Ser 1.28; Potassium 4.2; Sodium 140    NHYA CLASS: ***    ASSESSMENT AND PLAN:   1. S/P TAVR (transcatheter aortic valve replacement through PROGRESS CAP TRIAL   2. Research study patient   3. Coronary artery disease involving native coronary artery of native heart without angina pectoris   4. Primary hypertension   5. Hyperlipidemia LDL goal <70   6. Stage 3b chronic kidney disease (HCC)     Status post TAVR: Continue aspirin   81 mg indefinitely; echocardiogram in March demonstrated normally functioning TAVR with a mean gradient of 10.5 mmHg and a normal ejection fraction. Research study patient: Part of moderate aortic stenosis research trial (PROGRESS). Coronary artery disease: Status post PCI of RCA in December; continue DAPT with Plavix  until the end of June and then stop aspirin  entirely. Hypertension: Continue lisinopril /hydrochlorothiazide 20/12.5 mg daily.*** Hyperlipidemia: Patient declined statins; continue Zetia  10 mg daily.  Check lipid panel, LP(a). CKD stage IIIb: Continue lisinopril /hydrochlorothiazide 20 x 12.5 mg daily.  Follow-up:  ***   I spent *** minutes reviewing all clinical data during and prior to this visit including all relevant imaging studies, laboratories, clinical information from other health systems and prior notes from both Cardiology and other specialties, interviewing the patient, conducting a complete physical examination, and coordinating care in order to formulate a comprehensive and personalized evaluation and treatment plan.   Suraj Ramdass K Vickye Astorino, MD  10/17/2023 7:13 PM    [facility] [address] [place], [place], [place]  [address] Phone: [phone]; Fax: [phone]

## 2023-10-21 DIAGNOSIS — R300 Dysuria: Secondary | ICD-10-CM | POA: Diagnosis not present

## 2023-10-22 ENCOUNTER — Encounter (HOSPITAL_COMMUNITY)

## 2023-10-24 ENCOUNTER — Ambulatory Visit: Admitting: Internal Medicine

## 2023-10-24 ENCOUNTER — Encounter (HOSPITAL_COMMUNITY)

## 2023-10-25 ENCOUNTER — Encounter: Payer: Self-pay | Admitting: Internal Medicine

## 2023-10-25 ENCOUNTER — Ambulatory Visit: Attending: Internal Medicine | Admitting: Internal Medicine

## 2023-10-25 VITALS — BP 130/62 | HR 80 | Ht 66.0 in | Wt 195.0 lb

## 2023-10-25 DIAGNOSIS — E785 Hyperlipidemia, unspecified: Secondary | ICD-10-CM | POA: Diagnosis not present

## 2023-10-25 DIAGNOSIS — I10 Essential (primary) hypertension: Secondary | ICD-10-CM

## 2023-10-25 DIAGNOSIS — I2510 Atherosclerotic heart disease of native coronary artery without angina pectoris: Secondary | ICD-10-CM

## 2023-10-25 DIAGNOSIS — Z952 Presence of prosthetic heart valve: Secondary | ICD-10-CM

## 2023-10-25 DIAGNOSIS — Z006 Encounter for examination for normal comparison and control in clinical research program: Secondary | ICD-10-CM | POA: Diagnosis not present

## 2023-10-25 DIAGNOSIS — N1832 Chronic kidney disease, stage 3b: Secondary | ICD-10-CM

## 2023-10-25 NOTE — Patient Instructions (Addendum)
 Medication Instructions:  Your physician has recommended you make the following change in your medication:  1.) stop aspirin  on 11/19/23 2.) restart Zetia  in about 2 weeks  *If you need a refill on your cardiac medications before your next appointment, please call your pharmacy*  Lab Work: In 2 months- come in to first floor lab or any [facility] for blood work (lipids, Lp(a))   Follow-Up: At [facility], you and your health needs are our priority.  As part of our continuing mission to provide you with exceptional heart care, our providers are all part of one team.  This team includes your primary Cardiologist (physician) and Advanced Practice Providers or APPs (Physician Assistants and Nurse Practitioners) who all work together to provide you with the care you need, when you need it.  Your next appointment:   6 month(s)  Provider:   Marlyse Single, PA-C

## 2023-10-29 ENCOUNTER — Encounter (HOSPITAL_COMMUNITY)

## 2023-10-31 ENCOUNTER — Encounter (HOSPITAL_COMMUNITY)

## 2023-11-05 ENCOUNTER — Encounter (HOSPITAL_COMMUNITY)

## 2023-11-07 ENCOUNTER — Encounter (HOSPITAL_COMMUNITY)

## 2023-11-12 ENCOUNTER — Encounter (HOSPITAL_COMMUNITY)

## 2023-11-12 DIAGNOSIS — I10 Essential (primary) hypertension: Secondary | ICD-10-CM | POA: Diagnosis not present

## 2023-11-12 DIAGNOSIS — K2100 Gastro-esophageal reflux disease with esophagitis, without bleeding: Secondary | ICD-10-CM | POA: Diagnosis not present

## 2023-11-12 DIAGNOSIS — E7849 Other hyperlipidemia: Secondary | ICD-10-CM | POA: Diagnosis not present

## 2023-11-12 DIAGNOSIS — E209 Hypoparathyroidism, unspecified: Secondary | ICD-10-CM | POA: Diagnosis not present

## 2023-11-12 DIAGNOSIS — E1165 Type 2 diabetes mellitus with hyperglycemia: Secondary | ICD-10-CM | POA: Diagnosis not present

## 2023-11-14 ENCOUNTER — Encounter (HOSPITAL_COMMUNITY)

## 2023-11-18 DIAGNOSIS — E209 Hypoparathyroidism, unspecified: Secondary | ICD-10-CM | POA: Diagnosis not present

## 2023-11-18 DIAGNOSIS — E1122 Type 2 diabetes mellitus with diabetic chronic kidney disease: Secondary | ICD-10-CM | POA: Diagnosis not present

## 2023-11-18 DIAGNOSIS — K2100 Gastro-esophageal reflux disease with esophagitis, without bleeding: Secondary | ICD-10-CM | POA: Diagnosis not present

## 2023-11-19 ENCOUNTER — Encounter (HOSPITAL_COMMUNITY)

## 2023-11-21 ENCOUNTER — Encounter (HOSPITAL_COMMUNITY)

## 2023-11-27 DIAGNOSIS — H26493 Other secondary cataract, bilateral: Secondary | ICD-10-CM | POA: Diagnosis not present

## 2023-12-16 DIAGNOSIS — L80 Vitiligo: Secondary | ICD-10-CM | POA: Diagnosis not present

## 2023-12-16 DIAGNOSIS — C44622 Squamous cell carcinoma of skin of right upper limb, including shoulder: Secondary | ICD-10-CM | POA: Diagnosis not present

## 2023-12-25 DIAGNOSIS — I2510 Atherosclerotic heart disease of native coronary artery without angina pectoris: Secondary | ICD-10-CM | POA: Diagnosis not present

## 2023-12-25 DIAGNOSIS — E785 Hyperlipidemia, unspecified: Secondary | ICD-10-CM | POA: Diagnosis not present

## 2023-12-26 ENCOUNTER — Ambulatory Visit: Payer: Self-pay | Admitting: Internal Medicine

## 2023-12-26 DIAGNOSIS — I2510 Atherosclerotic heart disease of native coronary artery without angina pectoris: Secondary | ICD-10-CM

## 2023-12-26 DIAGNOSIS — E785 Hyperlipidemia, unspecified: Secondary | ICD-10-CM

## 2023-12-26 LAB — LIPID PANEL
Chol/HDL Ratio: 3.7 ratio (ref 0.0–4.4)
Cholesterol, Total: 206 mg/dL — ABNORMAL HIGH (ref 100–199)
HDL: 56 mg/dL (ref >39)
LDL Chol Calc (NIH): 120 mg/dL — ABNORMAL HIGH (ref 0–99)
Triglycerides: 173 mg/dL — ABNORMAL HIGH (ref 0–149)
VLDL Cholesterol Cal: 30 mg/dL (ref 5–40)

## 2023-12-26 LAB — LIPOPROTEIN A (LPA): Lipoprotein (a): 105.1 nmol/L — ABNORMAL HIGH (ref <75.0)

## 2023-12-30 DIAGNOSIS — C44622 Squamous cell carcinoma of skin of right upper limb, including shoulder: Secondary | ICD-10-CM | POA: Diagnosis not present

## 2024-01-07 DIAGNOSIS — H26491 Other secondary cataract, right eye: Secondary | ICD-10-CM | POA: Diagnosis not present

## 2024-01-07 DIAGNOSIS — H35372 Puckering of macula, left eye: Secondary | ICD-10-CM | POA: Diagnosis not present

## 2024-01-07 DIAGNOSIS — Z961 Presence of intraocular lens: Secondary | ICD-10-CM | POA: Diagnosis not present

## 2024-01-07 DIAGNOSIS — H26493 Other secondary cataract, bilateral: Secondary | ICD-10-CM | POA: Diagnosis not present

## 2024-01-07 DIAGNOSIS — H18413 Arcus senilis, bilateral: Secondary | ICD-10-CM | POA: Diagnosis not present

## 2024-01-09 ENCOUNTER — Ambulatory Visit (HOSPITAL_COMMUNITY)
Admission: RE | Admit: 2024-01-09 | Discharge: 2024-01-09 | Disposition: A | Discharge status: Home / Self Care | Source: Ambulatory Visit | Attending: Physician Assistant | Admitting: Physician Assistant

## 2024-01-09 DIAGNOSIS — R918 Other nonspecific abnormal finding of lung field: Secondary | ICD-10-CM | POA: Diagnosis not present

## 2024-01-09 DIAGNOSIS — R911 Solitary pulmonary nodule: Secondary | ICD-10-CM | POA: Insufficient documentation

## 2024-01-09 DIAGNOSIS — I700 Atherosclerosis of aorta: Secondary | ICD-10-CM | POA: Diagnosis not present

## 2024-01-14 DIAGNOSIS — H26491 Other secondary cataract, right eye: Secondary | ICD-10-CM | POA: Diagnosis not present

## 2024-01-23 ENCOUNTER — Ambulatory Visit: Payer: Self-pay | Admitting: Physician Assistant

## 2024-01-23 DIAGNOSIS — C44622 Squamous cell carcinoma of skin of right upper limb, including shoulder: Secondary | ICD-10-CM | POA: Diagnosis not present

## 2024-01-27 DIAGNOSIS — X32XXXD Exposure to sunlight, subsequent encounter: Secondary | ICD-10-CM | POA: Diagnosis not present

## 2024-01-27 DIAGNOSIS — L570 Actinic keratosis: Secondary | ICD-10-CM | POA: Diagnosis not present

## 2024-01-30 DIAGNOSIS — H26492 Other secondary cataract, left eye: Secondary | ICD-10-CM | POA: Diagnosis not present

## 2024-02-05 DIAGNOSIS — H26492 Other secondary cataract, left eye: Secondary | ICD-10-CM | POA: Diagnosis not present

## 2024-02-17 DIAGNOSIS — K5792 Diverticulitis of intestine, part unspecified, without perforation or abscess without bleeding: Secondary | ICD-10-CM | POA: Diagnosis not present

## 2024-03-09 DIAGNOSIS — X32XXXD Exposure to sunlight, subsequent encounter: Secondary | ICD-10-CM | POA: Diagnosis not present

## 2024-03-09 DIAGNOSIS — E7849 Other hyperlipidemia: Secondary | ICD-10-CM | POA: Diagnosis not present

## 2024-03-09 DIAGNOSIS — Z08 Encounter for follow-up examination after completed treatment for malignant neoplasm: Secondary | ICD-10-CM | POA: Diagnosis not present

## 2024-03-09 DIAGNOSIS — E1122 Type 2 diabetes mellitus with diabetic chronic kidney disease: Secondary | ICD-10-CM | POA: Diagnosis not present

## 2024-03-09 DIAGNOSIS — R5383 Other fatigue: Secondary | ICD-10-CM | POA: Diagnosis not present

## 2024-03-09 DIAGNOSIS — D559 Anemia due to enzyme disorder, unspecified: Secondary | ICD-10-CM | POA: Diagnosis not present

## 2024-03-09 DIAGNOSIS — L570 Actinic keratosis: Secondary | ICD-10-CM | POA: Diagnosis not present

## 2024-03-09 DIAGNOSIS — E559 Vitamin D deficiency, unspecified: Secondary | ICD-10-CM | POA: Diagnosis not present

## 2024-03-09 DIAGNOSIS — Z85828 Personal history of other malignant neoplasm of skin: Secondary | ICD-10-CM | POA: Diagnosis not present

## 2024-03-10 ENCOUNTER — Other Ambulatory Visit: Payer: Self-pay

## 2024-03-10 DIAGNOSIS — Z952 Presence of prosthetic heart valve: Secondary | ICD-10-CM

## 2024-03-25 NOTE — Progress Notes (Signed)
 AMBERLI RUEGG                                          MRN: [id number]   03/25/2024   The [facility] Quality Team Specialist reviewed this patient medical record for the purposes of chart review for care gap closure. The following were reviewed: chart review for care gap closure-kidney health evaluation for diabetes:eGFR  and uACR.    [facility] Quality Team

## 2024-04-24 ENCOUNTER — Other Ambulatory Visit: Payer: Self-pay | Admitting: Physician Assistant

## 2024-07-09 ENCOUNTER — Other Ambulatory Visit (HOSPITAL_COMMUNITY)

## 2024-07-09 ENCOUNTER — Encounter

## 2024-07-09 ENCOUNTER — Ambulatory Visit: Admitting: Physician Assistant
